# Patient Record
Sex: Male | Born: 1965 | Race: Black or African American | Hispanic: No | Marital: Married | Smoking: Never smoker
Health system: Southern US, Community
[De-identification: ages and names within clinical notes are randomized; demographics above are authoritative.]

## PROBLEM LIST (undated history)

## (undated) DIAGNOSIS — K219 Gastro-esophageal reflux disease without esophagitis: Secondary | ICD-10-CM

## (undated) DIAGNOSIS — M1990 Unspecified osteoarthritis, unspecified site: Secondary | ICD-10-CM

## (undated) DIAGNOSIS — E785 Hyperlipidemia, unspecified: Secondary | ICD-10-CM

## (undated) DIAGNOSIS — E559 Vitamin D deficiency, unspecified: Secondary | ICD-10-CM

## (undated) DIAGNOSIS — D4989 Neoplasm of unspecified behavior of other specified sites: Secondary | ICD-10-CM

## (undated) DIAGNOSIS — I10 Essential (primary) hypertension: Secondary | ICD-10-CM

## (undated) DIAGNOSIS — R0789 Other chest pain: Secondary | ICD-10-CM

## (undated) HISTORY — DX: Neoplasm of unspecified behavior of other specified sites: D49.89

## (undated) HISTORY — DX: Essential (primary) hypertension: I10

## (undated) HISTORY — DX: Hyperlipidemia, unspecified: E78.5

## (undated) HISTORY — DX: Other chest pain: R07.89

## (undated) HISTORY — PX: NO PAST SURGERIES: SHX2092

## (undated) HISTORY — DX: Vitamin D deficiency, unspecified: E55.9

## (undated) HISTORY — DX: Gastro-esophageal reflux disease without esophagitis: K21.9

---

## 2000-12-06 ENCOUNTER — Encounter: Payer: Self-pay | Admitting: *Deleted

## 2000-12-06 ENCOUNTER — Emergency Department (HOSPITAL_COMMUNITY): Admission: EM | Admit: 2000-12-06 | Discharge: 2000-12-06 | Payer: Self-pay | Admitting: *Deleted

## 2001-02-12 ENCOUNTER — Emergency Department (HOSPITAL_COMMUNITY): Admission: EM | Admit: 2001-02-12 | Discharge: 2001-02-12 | Payer: Self-pay | Admitting: Emergency Medicine

## 2003-09-30 ENCOUNTER — Emergency Department (HOSPITAL_COMMUNITY): Admission: EM | Admit: 2003-09-30 | Discharge: 2003-09-30 | Payer: Self-pay | Admitting: Emergency Medicine

## 2004-04-27 ENCOUNTER — Emergency Department (HOSPITAL_COMMUNITY): Admission: EM | Admit: 2004-04-27 | Discharge: 2004-04-28 | Payer: Self-pay | Admitting: Emergency Medicine

## 2005-12-25 ENCOUNTER — Emergency Department (HOSPITAL_COMMUNITY): Admission: EM | Admit: 2005-12-25 | Discharge: 2005-12-26 | Payer: Self-pay | Admitting: Emergency Medicine

## 2006-10-11 ENCOUNTER — Emergency Department (HOSPITAL_COMMUNITY): Admission: EM | Admit: 2006-10-11 | Discharge: 2006-10-12 | Payer: Self-pay | Admitting: Emergency Medicine

## 2007-03-22 ENCOUNTER — Emergency Department (HOSPITAL_COMMUNITY): Admission: EM | Admit: 2007-03-22 | Discharge: 2007-03-22 | Payer: Self-pay | Admitting: Emergency Medicine

## 2009-06-08 ENCOUNTER — Emergency Department (HOSPITAL_COMMUNITY): Admission: EM | Admit: 2009-06-08 | Discharge: 2009-06-09 | Payer: Self-pay | Admitting: Emergency Medicine

## 2010-02-04 ENCOUNTER — Emergency Department (HOSPITAL_COMMUNITY): Admission: EM | Admit: 2010-02-04 | Discharge: 2010-02-04 | Payer: Self-pay | Admitting: Emergency Medicine

## 2010-09-28 ENCOUNTER — Emergency Department (HOSPITAL_COMMUNITY)
Admission: EM | Admit: 2010-09-28 | Discharge: 2010-09-28 | Disposition: A | Discharge status: Home / Self Care | Payer: Self-pay | Attending: Emergency Medicine | Admitting: Emergency Medicine

## 2010-09-28 ENCOUNTER — Encounter: Payer: Self-pay | Admitting: *Deleted

## 2010-09-28 DIAGNOSIS — R51 Headache: Secondary | ICD-10-CM | POA: Insufficient documentation

## 2010-09-28 DIAGNOSIS — Z87891 Personal history of nicotine dependence: Secondary | ICD-10-CM | POA: Insufficient documentation

## 2010-09-28 MED ORDER — HYDROMORPHONE HCL 1 MG/ML IJ SOLN
1.0000 mg | Freq: Once | INTRAMUSCULAR | Status: DC
  Filled 2010-09-28: qty 1

## 2010-09-28 MED ORDER — IBUPROFEN 800 MG PO TABS: 800.0000 mg | ORAL_TABLET | Freq: Three times a day (TID) | ORAL | Status: AC

## 2010-09-28 MED ORDER — HYDROMORPHONE HCL 1 MG/ML IJ SOLN
1.0000 mg | Freq: Once | INTRAMUSCULAR | Status: AC
  Administered 2010-09-28: 1 mg via INTRAMUSCULAR

## 2010-09-28 MED ORDER — DIPHENHYDRAMINE HCL 25 MG PO CAPS
25.0000 mg | ORAL_CAPSULE | Freq: Once | ORAL | Status: AC
  Administered 2010-09-28: 25 mg via ORAL
  Filled 2010-09-28: qty 1

## 2010-09-28 MED ORDER — KETOROLAC TROMETHAMINE 30 MG/ML IJ SOLN
60.0000 mg | Freq: Once | INTRAMUSCULAR | Status: AC
  Administered 2010-09-28: 60 mg via INTRAMUSCULAR
  Filled 2010-09-28: qty 2

## 2010-09-28 MED ORDER — ONDANSETRON HCL 4 MG PO TABS
4.0000 mg | ORAL_TABLET | Freq: Once | ORAL | Status: AC
  Administered 2010-09-28: 4 mg via ORAL
  Filled 2010-09-28: qty 1

## 2010-09-28 NOTE — ED Notes (Signed)
Pt reports left side head pain x 1 week and states he took ibuprofen at home without relief.

## 2010-09-28 NOTE — ED Provider Notes (Signed)
History     Chief Complaint  Patient presents with  . Headache   Patient is a 45 y.o. male presenting with headaches. The history is provided by the patient.  Headache  This is a new problem. The current episode started more than 1 week ago. The problem occurs constantly. The problem has not changed since onset.The headache is associated with nothing. The pain is located in the left unilateral and temporal region. The quality of the pain is described as sharp. The pain is moderate. Pertinent negatives include no anorexia, no fever, no near-syncope, no nausea and no vomiting. He has tried NSAIDs for the symptoms. The treatment provided no relief.    History reviewed. No pertinent past medical history.  History reviewed. No pertinent past surgical history.  History reviewed. No pertinent family history.  History  Substance Use Topics  . Smoking status: Never Smoker   . Smokeless tobacco: Former Neurosurgeon    Quit date: 09/26/2010  . Alcohol Use: 1.8 oz/week    3 Cans of beer per week      Review of Systems  Constitutional: Negative for fever.  Cardiovascular: Negative for near-syncope.  Gastrointestinal: Negative for nausea, vomiting and anorexia.  Neurological: Positive for headaches.  All other systems reviewed and are negative.    Physical Exam  BP 154/103  Pulse 76  Temp(Src) 98.7 F (37.1 C) (Oral)  Resp 16  Ht 6' (1.829 m)  Wt 238 lb (107.956 kg)  BMI 32.28 kg/m2  SpO2 99%  Physical Exam  Constitutional: He is oriented to person, place, and time. He appears well-developed and well-nourished.  HENT:  Head: Normocephalic and atraumatic.  Right Ear: External ear normal.  Left Ear: External ear normal.  Nose: Nose normal.  Mouth/Throat: Oropharynx is clear and moist.  Eyes: Conjunctivae and EOM are normal.  Neck: Normal range of motion. Neck supple.  Cardiovascular: Regular rhythm.   Pulmonary/Chest: Effort normal and breath sounds normal.  Abdominal: Soft.  Bowel sounds are normal.  Musculoskeletal: Normal range of motion.  Neurological: He is alert and oriented to person, place, and time. He has normal reflexes. He displays normal reflexes. No cranial nerve deficit. Coordination normal.       No vision changes, no hearing changes, no difficulty with speaking or swallowing, no nausea, no vomiting  Skin: Skin is warm and dry.    ED Course  Procedures  MDM       Nicoletta Dress. Colon Branch, MD 09/28/10 7829

## 2010-09-28 NOTE — ED Notes (Signed)
Pt discirbes a sharp pain to the upper left side of head.

## 2010-09-28 NOTE — Progress Notes (Signed)
0700 Headache has resolved.

## 2011-01-03 LAB — COMPREHENSIVE METABOLIC PANEL
ALT: 42
Alkaline Phosphatase: 68
CO2: 28
Chloride: 104
Glucose, Bld: 106 — ABNORMAL HIGH
Potassium: 3.7
Sodium: 137
Total Protein: 7.3

## 2011-01-03 LAB — CBC
HCT: 43.2
Hemoglobin: 15
MCHC: 34.7
MCV: 90.7
Platelets: 200
RBC: 4.76
RDW: 13.6
WBC: 5.3

## 2011-01-03 LAB — DIFFERENTIAL
Basophils Relative: 1
Eosinophils Absolute: 0.3
Monocytes Relative: 8
Neutrophils Relative %: 46

## 2011-05-28 ENCOUNTER — Emergency Department (HOSPITAL_COMMUNITY)
Admission: EM | Admit: 2011-05-28 | Discharge: 2011-05-28 | Disposition: A | Discharge status: Home / Self Care | Payer: Self-pay | Attending: Emergency Medicine | Admitting: Emergency Medicine

## 2011-05-28 ENCOUNTER — Emergency Department (HOSPITAL_COMMUNITY): Payer: Self-pay

## 2011-05-28 ENCOUNTER — Encounter (HOSPITAL_COMMUNITY): Payer: Self-pay

## 2011-05-28 DIAGNOSIS — X500XXA Overexertion from strenuous movement or load, initial encounter: Secondary | ICD-10-CM | POA: Insufficient documentation

## 2011-05-28 DIAGNOSIS — M25473 Effusion, unspecified ankle: Secondary | ICD-10-CM | POA: Insufficient documentation

## 2011-05-28 DIAGNOSIS — S93409A Sprain of unspecified ligament of unspecified ankle, initial encounter: Secondary | ICD-10-CM | POA: Insufficient documentation

## 2011-05-28 DIAGNOSIS — M7989 Other specified soft tissue disorders: Secondary | ICD-10-CM | POA: Insufficient documentation

## 2011-05-28 DIAGNOSIS — Y9364 Activity, baseball: Secondary | ICD-10-CM | POA: Insufficient documentation

## 2011-05-28 DIAGNOSIS — M25579 Pain in unspecified ankle and joints of unspecified foot: Secondary | ICD-10-CM | POA: Insufficient documentation

## 2011-05-28 DIAGNOSIS — M79609 Pain in unspecified limb: Secondary | ICD-10-CM | POA: Insufficient documentation

## 2011-05-28 DIAGNOSIS — M25476 Effusion, unspecified foot: Secondary | ICD-10-CM | POA: Insufficient documentation

## 2011-05-28 MED ORDER — OXYCODONE-ACETAMINOPHEN 5-325 MG PO TABS: 1.0000 | ORAL_TABLET | ORAL | Status: DC | PRN

## 2011-05-28 MED ORDER — OXYCODONE-ACETAMINOPHEN 5-325 MG PO TABS
2.0000 | ORAL_TABLET | Freq: Once | ORAL | Status: AC
  Administered 2011-05-28: 2 via ORAL
  Filled 2011-05-28: qty 2

## 2011-05-28 MED ORDER — IBUPROFEN 800 MG PO TABS
800.0000 mg | ORAL_TABLET | Freq: Once | ORAL | Status: AC
  Administered 2011-05-28: 800 mg via ORAL
  Filled 2011-05-28: qty 1

## 2011-05-28 MED ORDER — IBUPROFEN 800 MG PO TABS: 800.0000 mg | ORAL_TABLET | Freq: Three times a day (TID) | ORAL | Status: AC

## 2011-05-28 NOTE — ED Provider Notes (Signed)
History     CSN: [id number]  Arrival date & time 05/28/11  1636   First MD Initiated Contact with Patient 05/28/11 1655      Chief Complaint  Patient presents with  . Ankle Pain    (Consider location/radiation/quality/duration/timing/severity/associated sxs/prior treatment) HPI Corey Thomas is a 46 y.o. male who presents to the Emergency Department complaining of  Right ankle pain that began while playing softball. He slid into base and twisted his foot, everting it. Now with swelling and pain. Unable to bear weight without pain. Has taken no medicines. Denies numbness, tingling.  History reviewed. No pertinent past medical history.  History reviewed. No pertinent past surgical history.  No family history on file.  History  Substance Use Topics  . Smoking status: Never Smoker   . Smokeless tobacco: Former Neurosurgeon    Quit date: 09/26/2010  . Alcohol Use: 1.8 oz/week    3 Cans of beer per week      Review of Systems  Constitutional: Negative for fever.       10 Systems reviewed and are negative for acute change except as noted in the HPI.  HENT: Negative for congestion.   Eyes: Negative for discharge and redness.  Respiratory: Negative for cough and shortness of breath.   Cardiovascular: Negative for chest pain.  Gastrointestinal: Negative for vomiting and abdominal pain.  Musculoskeletal: Negative for back pain.  Skin: Negative for rash.  Neurological: Negative for syncope, numbness and headaches.  Psychiatric/Behavioral:       No behavior change.    Allergies  Review of patient's allergies indicates no known allergies.  Home Medications   Current Outpatient Rx  Name Route Sig Dispense Refill  . IBUPROFEN 800 MG PO TABS Oral Take 1 tablet (800 mg total) by mouth 3 (three) times daily. 21 tablet 0  . OXYCODONE-ACETAMINOPHEN 5-325 MG PO TABS Oral Take 1 tablet by mouth every 4 (four) hours as needed for pain. 30 tablet 0    BP 167/97  Pulse 90  Temp(Src)  98.4 F (36.9 C) (Oral)  Resp 20  Ht 6' (1.829 m)  Wt 205 lb (92.987 kg)  BMI 27.80 kg/m2  SpO2 100%  Physical Exam  Nursing note and vitals reviewed. Constitutional:       Awake, alert, nontoxic appearance.  HENT:  Head: Atraumatic.  Eyes: Right eye exhibits no discharge. Left eye exhibits no discharge.  Neck: Neck supple.  Pulmonary/Chest: Effort normal. He exhibits no tenderness.  Abdominal: Soft. There is no tenderness. There is no rebound.  Musculoskeletal: He exhibits tenderness.       Right lateral ankle with swelling. Limited dorsiflexion without pain. Unable to evert without pain. Pulses 2+  Neurological:       Mental status and motor strength appears baseline for patient and situation.  Skin: No rash noted.  Psychiatric: He has a normal mood and affect.    ED Course  Procedures (including critical care time)  Labs Reviewed - No data to display Dg Ankle Complete Right  05/28/2011  *RADIOLOGY REPORT*  Clinical Data: , complaining of right-sided ankle pain.  RIGHT ANKLE - COMPLETE 3+ VIEW  Comparison: No priors.  Findings: AP, lateral and oblique views of the right ankle demonstrate extensive soft tissue swelling overlying the lateral malleolus.  No underlying displaced fracture of the distal fibula is appreciated.  On the lateral projection, there is a small bony fragment superior to the talonavicular joint, likely to represent a capsular avulsion fracture.  No other  displaced fractures are identified.  IMPRESSION: 1.  Probable capsular avulsion fracture at the talonavicular joint, as above. 2.  Extensive soft tissue swelling overlying the lateral malleolus, without evidence of displaced distal fibular fracture.  Original Report Authenticated By: Florencia Reasons, M.D.     1. Ankle sprain       MDM  Patient with eversion injury to his right ankle playing softball. Given analgesics, placed in ASO and on crutches. Patient to follow up with Dr. Hilda Lias, ortho on  Monday.Pt stable in ED with no significant deterioration in condition.The patient appears reasonably screened and/or stabilized for discharge and I doubt any other medical condition or other [facility] requiring further screening, evaluation, or treatment in the ED at this time prior to discharge.  MDM Reviewed: nursing note and vitals Interpretation: x-ray           Nicoletta Dress. Colon Branch, MD 05/28/11 2229

## 2011-05-28 NOTE — Discharge Instructions (Signed)
Elevate the foot as much as possible. Apply ice for the swelling. Do not do weight bearing for the next 24 hours then begin by toe tapping and gradually apply weight. Use the  Pain medicines as directed. Call Dr. Sanjuan Dame office on Monday for an appointment.    Ankle Sprain An ankle sprain happens when the bands of tissue that hold the ankle bones together (ligaments) stretch too much and tear.  HOME CARE   Put ice on the ankle for 15 to 20 minutes, 3 to 4 times a day.   Put ice in a plastic bag.   Place a towel between your skin and the bag.   You may stop icing when the puffiness (swelling) goes down.   Raise (elevate) the injured ankle to lessen puffiness.   Use crutches if your doctor tells you to. Use them until you can walk without pain.   If a plaster splint was applied:   Rest the plaster splint on nothing harder than a pillow for 24 hours.   Do not put weight on it.   Do not get it wet.   Take it off to shower or bathe.   Follow up with your doctor.   Use an elastic wrap for support. Take the wrap off if the toes lose feeling (numb), tingle, or turn cold or blue.   If an air splint was applied:   Add or release air to make it comfortable.   Take it off at night and to shower and bathe.   Wiggle your toes while wearing the air splint.   Only take medicine as told by your doctor.   Do not drive until your doctor says it is okay.  GET HELP RIGHT AWAY IF:   You have more bruising, puffiness, or pain.   Your toes feel cold.   Your medicine does not help lessen your pain.   You are losing feeling in your toes or they turn blue.   You have severe pain.  MAKE SURE YOU:   Understand these instructions.   Will watch your condition.   Will get help right away if you are not doing well or get worse.  Document Released: 08/24/2007 Document Revised: 02/24/2011 Document Reviewed: 08/24/2007 [facility] Patient Information 2012 [place], [place].

## 2011-05-28 NOTE — ED Notes (Signed)
Playing softball today, slid into base and injured right ankle

## 2011-05-31 ENCOUNTER — Encounter (HOSPITAL_COMMUNITY): Payer: Self-pay

## 2011-05-31 ENCOUNTER — Emergency Department (HOSPITAL_COMMUNITY)
Admission: EM | Admit: 2011-05-31 | Discharge: 2011-05-31 | Disposition: A | Discharge status: Home / Self Care | Payer: Self-pay | Attending: Orthopaedic Surgery | Admitting: Orthopaedic Surgery

## 2011-05-31 DIAGNOSIS — S93409A Sprain of unspecified ligament of unspecified ankle, initial encounter: Secondary | ICD-10-CM | POA: Insufficient documentation

## 2011-05-31 DIAGNOSIS — Y9239 Other specified sports and athletic area as the place of occurrence of the external cause: Secondary | ICD-10-CM | POA: Insufficient documentation

## 2011-05-31 DIAGNOSIS — X58XXXA Exposure to other specified factors, initial encounter: Secondary | ICD-10-CM | POA: Insufficient documentation

## 2011-05-31 DIAGNOSIS — Y9364 Activity, baseball: Secondary | ICD-10-CM | POA: Insufficient documentation

## 2011-05-31 DIAGNOSIS — S92253A Displaced fracture of navicular [scaphoid] of unspecified foot, initial encounter for closed fracture: Secondary | ICD-10-CM | POA: Insufficient documentation

## 2011-05-31 MED ORDER — HYDROCODONE-ACETAMINOPHEN 7.5-325 MG PO TABS: 1.0000 | ORAL_TABLET | Freq: Four times a day (QID) | ORAL | Status: AC | PRN

## 2011-05-31 MED ORDER — HYDROCODONE-ACETAMINOPHEN 5-325 MG PO TABS: 1.5000 | ORAL_TABLET | Freq: Four times a day (QID) | ORAL | Status: DC | PRN

## 2011-05-31 NOTE — Consult Note (Signed)
Reason for Consult:right foot and ankle pain Referring Physician: ER  Corey Thomas is an 46 y.o. male.  HPI: He hurt his right foot and ankle after sliding into second base during a game Saturday in [place].  He had swelling and pain.  He came to the ER then.  X-rays showed avulsion fracture off anterior navicular of the foot plus swelling of soft tissue. He was given splint and crutches.  He has had continued pain, swelling.  He has no other injury.  History reviewed. No pertinent past medical history.  History reviewed. No pertinent past surgical history.  No family history on file.  Social History:  reports that he has never smoked. He quit smokeless tobacco use about 8 months ago. He reports that he drinks about 1.8 ounces of alcohol per week. He reports that he does not use illicit drugs.  Allergies: No Known Allergies  Medications: I have reviewed the patient's current medications.  No results found for this or any previous visit (from the past 48 hour(s)).  No results found.  Review of Systems  Constitutional: Negative.   HENT: Negative.   Eyes: Negative.   Respiratory: Negative.   Cardiovascular: Negative.   Gastrointestinal: Negative.   Genitourinary: Negative.   Musculoskeletal: Positive for joint pain (right foot swelling and ankle swelling since injury over the weekend.).  Skin: Negative.   Neurological: Negative.   Endo/Heme/Allergies: Negative.   Psychiatric/Behavioral: Negative.    Blood pressure 134/87, pulse 68, temperature 98.2 F (36.8 C), temperature source Oral, height 6' (1.829 m), weight 92.987 kg (205 lb), SpO2 96.00%. Physical Exam  Constitutional: He is oriented to person, place, and time. He appears well-developed and well-nourished.  HENT:  Head: Normocephalic and atraumatic.  Eyes: Conjunctivae and EOM are normal. Pupils are equal, round, and reactive to light.  Neck: Neck supple.  Cardiovascular: Normal rate, regular rhythm and intact  distal pulses.   Respiratory: Effort normal and breath sounds normal.  GI: Soft. Bowel sounds are normal.  Musculoskeletal: He exhibits edema (right ankle, more laterally) and tenderness (right ankle and foot dorsally).       Right ankle: He exhibits decreased range of motion and swelling. tenderness.       Feet:  Neurological: He is alert and oriented to person, place, and time. He has normal reflexes.  Skin: Skin is warm and dry.  Psychiatric: He has a normal mood and affect. His behavior is normal. Judgment and thought content normal.    Assessment/Plan: Avulsion fracture of the right ankle navicular and strain of the right ankle.  Plan: Norco 7.5 Continue crutches CAM walker Contrast Baths To be seen in office next week.   Vauda Salvucci 05/31/2011, 9:20 AM

## 2011-05-31 NOTE — Discharge Instructions (Signed)
Foot Fracture Your caregiver has diagnosed you as having a foot fracture (broken bone). Your foot has many bones. You have a fracture, or break, in one of these bones. In some cases, your doctor may put on a splint or removable fracture boot until the swelling in your foot has lessened. A cast may or may not be required. HOME CARE INSTRUCTIONS  If you do not have a cast or splint:  You may bear weight on your injured foot as tolerated or advised.   Do not put any weight on your injured foot for as long as directed by your caregiver. Slowly increase the amount of time you walk on the foot as the pain and swelling allows or as advised.   Use crutches until you can bear weight without pain. A gradual increase in weight bearing may help.   Apply ice to the injury for 15 to 20 minutes each hour while awake for the first 2 days. Put the ice in a plastic bag and place a towel between the bag of ice and your skin.   If an ace bandage (stretchy, elastic wrapping bandage) was applied, you may re-wrap it if ankle is more painful or your toes become cold and swollen.  If you have a cast or splint:  Use your crutches for as long as directed by your caregiver.   To lessen the swelling, keep the injured foot elevated on pillows while lying down or sitting. Elevate your foot above your heart.   Apply ice to the injury for 15 to 20 minutes each hour while awake for the first 2 days. Put the ice in a plastic bag and place a thin towel between the bag of ice and your cast.   Plaster or fiberglass cast:   Do not try to scratch the skin under the cast using a sharp or pointed object down the cast.   Check the skin around the cast every day. You may put lotion on any red or sore areas.   Keep your cast clean and dry.   Plaster splint:   Wear the splint until you are seen for a follow-up examination.   You may loosen the elastic around the splint if your toes become numb, tingle, or turn blue or cold. Do  not rest it on anything harder than a pillow in the first 24 hours.   Do not put pressure on any part of your splint. Use your crutches as directed.   Keep your splint dry. It can be protected during bathing with a plastic bag. Do not lower the splint into water.   If you have a fracture boot you may remove it to shower. Bear weight only as instructed by your caregiver.   Only take over-the-counter or prescription medicines for pain, discomfort, or fever as directed by your caregiver.  SEEK IMMEDIATE MEDICAL CARE IF:   Your cast gets damaged or breaks.   You have continued severe pain or more swelling than you did before the cast was put on.   Your skin or nails of your casted foot turn blue, gray, feel cold or numb.   There is a bad smell from your cast.   There is severe pain with movement of your toes.   There are new stains and/or drainage coming from under the cast.  MAKE SURE YOU:   Understand these instructions.   Will watch your condition.   Will get help right away if you are not doing well or get   worse.  Document Released: 03/04/2000 Document Revised: 02/24/2011 Document Reviewed: 04/10/2008 [employer] Patient Information 2012 [employer].Cast or Splint Care Casts and splints support injured limbs and keep bones from moving while they heal.  HOME CARE  Keep the cast or splint uncovered during the drying period.   A plaster cast can take 24 to 48 hours to dry.   A fiberglass cast will dry in less than 1 hour.   Do not rest the cast on anything harder than a pillow for 24 hours.   Do not put weight on your injured limb. Do not put pressure on the cast. Wait for your doctor's approval.   Keep the cast or splint dry.   Cover the cast or splint with a plastic bag during baths or wet weather.   If you have a cast over your chest and belly (trunk), take sponge baths until the cast is taken off.   Keep your cast or splint clean. Wash a dirty cast with a damp  cloth.   Do not put any objects under your cast or splint. Do not scratch the skin under the cast with an object.   Do not take out the padding from inside your cast.   Exercise your joints near the cast as told by your doctor.   Raise (elevate) your injured limb on 1 or 2 pillows for the first 1 to 3 days.  GET HELP RIGHT AWAY IF:  Your cast or splint cracks.   Your cast or splint is too tight or too loose.   You itch badly under the cast.   Your cast gets wet or has a soft spot.   You have a bad smell coming from the cast.   You get an object stuck under the cast.   Your skin around the cast becomes red or raw.   You have new or more pain after the cast is put on.   You have fluid leaking through the cast.   You cannot move your fingers or toes.   Your fingers or toes turn colors or are cool, painful, or puffy (swollen).   You have tingling or lose feeling (numbness) around the injured area.   You have pain or pressure under the cast.   You have trouble breathing or have shortness of breath.   You have chest pain.  MAKE SURE YOU:  Understand these instructions.   Will watch your condition.   Will get help right away if you are not doing well or get worse.  Document Released: 07/07/2010 Document Revised: 02/24/2011 Document Reviewed: 07/07/2010 [employer] Patient Information 2012 [employer]. 

## 2011-05-31 NOTE — ED Notes (Signed)
Pt reports was seen here Saturday for r ankle pain.  Reports was diagnosed with fracture and was instructed to call Dr. Hilda Lias on Monday.  Says called the office yesterday but did not here from them.  Wife called again this am and was instructed to come to ed and Dr. Hilda Lias would see him here.

## 2011-09-29 ENCOUNTER — Other Ambulatory Visit (HOSPITAL_COMMUNITY): Payer: Self-pay | Admitting: Orthopaedic Surgery

## 2011-09-29 ENCOUNTER — Ambulatory Visit (HOSPITAL_COMMUNITY)
Admission: RE | Admit: 2011-09-29 | Discharge: 2011-09-29 | Disposition: A | Discharge status: Home / Self Care | Payer: Medicaid Other | Source: Ambulatory Visit | Attending: Orthopaedic Surgery | Admitting: Orthopaedic Surgery

## 2011-09-29 DIAGNOSIS — M25571 Pain in right ankle and joints of right foot: Secondary | ICD-10-CM

## 2011-09-29 DIAGNOSIS — M79671 Pain in right foot: Secondary | ICD-10-CM

## 2011-09-29 DIAGNOSIS — M25579 Pain in unspecified ankle and joints of unspecified foot: Secondary | ICD-10-CM | POA: Insufficient documentation

## 2012-01-11 ENCOUNTER — Emergency Department (HOSPITAL_COMMUNITY)
Admission: EM | Admit: 2012-01-11 | Discharge: 2012-01-11 | Disposition: A | Discharge status: Home / Self Care | Payer: Worker's Compensation | Attending: Emergency Medicine | Admitting: Emergency Medicine

## 2012-01-11 ENCOUNTER — Encounter (HOSPITAL_COMMUNITY): Payer: Self-pay | Admitting: Emergency Medicine

## 2012-01-11 DIAGNOSIS — R0789 Other chest pain: Secondary | ICD-10-CM | POA: Insufficient documentation

## 2012-01-11 DIAGNOSIS — T7840XA Allergy, unspecified, initial encounter: Secondary | ICD-10-CM

## 2012-01-11 DIAGNOSIS — Y929 Unspecified place or not applicable: Secondary | ICD-10-CM | POA: Insufficient documentation

## 2012-01-11 DIAGNOSIS — T6391XA Toxic effect of contact with unspecified venomous animal, accidental (unintentional), initial encounter: Secondary | ICD-10-CM | POA: Insufficient documentation

## 2012-01-11 DIAGNOSIS — Z87891 Personal history of nicotine dependence: Secondary | ICD-10-CM | POA: Insufficient documentation

## 2012-01-11 DIAGNOSIS — T63461A Toxic effect of venom of wasps, accidental (unintentional), initial encounter: Secondary | ICD-10-CM | POA: Insufficient documentation

## 2012-01-11 DIAGNOSIS — Y939 Activity, unspecified: Secondary | ICD-10-CM | POA: Insufficient documentation

## 2012-01-11 MED ORDER — EPINEPHRINE 0.3 MG/0.3ML IJ DEVI: 0.3000 mg | Freq: Once | INTRAMUSCULAR | Status: DC

## 2012-01-11 MED ORDER — OXYCODONE-ACETAMINOPHEN 5-325 MG PO TABS: 2.0000 | ORAL_TABLET | ORAL | Status: DC | PRN

## 2012-01-11 NOTE — ED Notes (Signed)
Pt sts sent from [facility] with multiple hornet sting to head; pt sts trouble getting deep breath; pt sts pain in head from stings; swelling noted; pt given steriod, epi and benadryl; no resp distress noted

## 2012-01-11 NOTE — ED Provider Notes (Signed)
History   This chart was scribed for Corey Baker, MD by Charolett Bumpers . The patient was seen in room TR05C/TR05C. Patient's care was started at 1109.   CSN: [id number] Arrival date & time 01/11/12  1014  First MD Initiated Contact with Patient 01/11/12 1109      Chief Complaint  Patient presents with  . Allergic Reaction  . Insect Bite    The history is provided by the patient. No language interpreter was used.   DOMINICO CAMPAGNA is a 46 y.o. male who presents to the Emergency Department complaining of an allergic reaction after being stung be a hornets PTA. He reports associated local swelling at insect stings, difficulty breathing, mild difficulty swallowing, chest tightness that started this morning. He was seen at [facility] where he received benadryl, steroid injection and epinephrine. He reports improvement in his symptoms after being seen at [facility]. He denies any h/o similar reactions.   History reviewed. No pertinent past medical history.  History reviewed. No pertinent past surgical history.  History reviewed. No pertinent family history.  History  Substance Use Topics  . Smoking status: Never Smoker   . Smokeless tobacco: Former Neurosurgeon    Quit date: 09/26/2010  . Alcohol Use: 1.8 oz/week    3 Cans of beer per week      Review of Systems  Constitutional: Negative for fever and chills.  HENT: Positive for trouble swallowing.   Respiratory: Positive for chest tightness and shortness of breath.   Gastrointestinal: Negative for nausea and vomiting.  Skin:       Insect stings.   Neurological: Negative for weakness.  All other systems reviewed and are negative.    Allergies  Review of patient's allergies indicates no known allergies.  Home Medications  No current outpatient prescriptions on file.  BP 169/96  Pulse 73  Temp 98.3 F (36.8 C) (Oral)  Resp 20  SpO2 100%  Physical Exam  Nursing note and vitals reviewed. Constitutional: He is oriented to  person, place, and time. He appears well-developed and well-nourished.  Non-toxic appearance. No distress.  HENT:  Head: Normocephalic and atraumatic.       Swelling at right posterior parietal area. No skin erythema.   Eyes: Conjunctivae normal, EOM and lids are normal. Pupils are equal, round, and reactive to light.  Neck: Normal range of motion. Neck supple. No tracheal deviation present. No mass present.  Cardiovascular: Normal rate, regular rhythm and normal heart sounds.  Exam reveals no gallop.   No murmur heard. Pulmonary/Chest: Effort normal and breath sounds normal. No stridor. No respiratory distress. He has no decreased breath sounds. He has no wheezes. He has no rhonchi. He has no rales.  Abdominal: Soft. Normal appearance and bowel sounds are normal. He exhibits no distension. There is no tenderness. There is no rebound and no CVA tenderness.  Musculoskeletal: Normal range of motion. He exhibits no edema and no tenderness.  Neurological: He is alert and oriented to person, place, and time. He has normal strength. No cranial nerve deficit or sensory deficit. GCS eye subscore is 4. GCS verbal subscore is 5. GCS motor subscore is 6.  Skin: Skin is warm and dry. No abrasion and no rash noted.  Psychiatric: He has a normal mood and affect. His speech is normal and behavior is normal.    ED Course  Procedures (including critical care time)  DIAGNOSTIC STUDIES: Oxygen Saturation is 100% on room air, normal by my interpretation.  COORDINATION OF CARE:  11:20-Discussed planned course of treatment with the patient, who is agreeable at this time.    Labs Reviewed - No data to display No results found.   No diagnosis found.    MDM  I personally performed the services described in this documentation, which was scribed in my presence. The recorded information has been reviewed and considered.  Pt given epi pen --no signs of systemic allergic reaction          Corey Baker, MD 01/11/12 1131

## 2012-10-24 ENCOUNTER — Other Ambulatory Visit: Payer: Self-pay | Admitting: Family Medicine

## 2012-12-26 ENCOUNTER — Ambulatory Visit (INDEPENDENT_AMBULATORY_CARE_PROVIDER_SITE_OTHER): Payer: Medicaid Other | Admitting: Family Medicine

## 2012-12-26 ENCOUNTER — Ambulatory Visit: Payer: Self-pay | Admitting: Family Medicine

## 2012-12-26 ENCOUNTER — Encounter: Payer: Self-pay | Admitting: Family Medicine

## 2012-12-26 ENCOUNTER — Ambulatory Visit (HOSPITAL_COMMUNITY)
Admission: RE | Admit: 2012-12-26 | Discharge: 2012-12-26 | Disposition: A | Discharge status: Home / Self Care | Payer: Medicaid Other | Source: Ambulatory Visit | Attending: Family Medicine | Admitting: Family Medicine

## 2012-12-26 VITALS — BP 150/110 | HR 78 | Temp 97.0°F | Resp 18

## 2012-12-26 DIAGNOSIS — M25471 Effusion, right ankle: Secondary | ICD-10-CM

## 2012-12-26 DIAGNOSIS — M25579 Pain in unspecified ankle and joints of unspecified foot: Secondary | ICD-10-CM | POA: Insufficient documentation

## 2012-12-26 DIAGNOSIS — M25476 Effusion, unspecified foot: Secondary | ICD-10-CM | POA: Insufficient documentation

## 2012-12-26 DIAGNOSIS — M25473 Effusion, unspecified ankle: Secondary | ICD-10-CM

## 2012-12-26 DIAGNOSIS — R030 Elevated blood-pressure reading, without diagnosis of hypertension: Secondary | ICD-10-CM

## 2012-12-26 MED ORDER — NAPROXEN 500 MG PO TABS: 500.0000 mg | ORAL_TABLET | Freq: Two times a day (BID) | ORAL | Status: DC

## 2012-12-26 MED ORDER — HYDROCODONE-ACETAMINOPHEN 5-325 MG PO TABS: 1.0000 | ORAL_TABLET | Freq: Four times a day (QID) | ORAL | Status: DC | PRN

## 2012-12-26 NOTE — Assessment & Plan Note (Signed)
  No h/o HTN, in considerable pain today Will have him return for repeat BP

## 2012-12-26 NOTE — Progress Notes (Signed)
  Subjective:    Patient ID: Corey Thomas, male    DOB: [birth year], 47 y.o.   MRN: [id number]  HPI  Pt here with right foot/ankle pain. Woke up this morning with swelling in foot, no injury at all. Had injury last year with softball resulting in fracture of foot and sprain since then no problems. No history of gout, no recent illness, no insect bites. Has not taken any meds. 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   Review of Systems  GEN- denies fatigue, fever, weight loss,weakness, recent illness HEENT- denies eye drainage, change in vision, nasal discharge, CVS- denies chest pain, palpitations RESP- denies SOB, cough, wheeze MSK- + joint pain, muscle aches, injury Neuro- denies headache, dizziness, syncope, seizure activity      Objective:   Physical Exam GEN- sitting in pain, no cardiopulmonary distress MSK- Right ankle+swelling, TTP, mild warmth, decreased ROM, pain with weight bearing, achilles in tact, no pain with ROM metarsals, Left Foot- normal inspection FROM        Assessment & Plan:

## 2012-12-26 NOTE — Assessment & Plan Note (Addendum)
   Acute onset with effusion in joint. No signs of infection I am worried about acute gout- RICE therapy Xray showed no injury Given naprosyn and norco He declined getting the labs drawn before leaving the office after I finished my direct care with him, he was called back by nurse to remind him to get labs done in [place] after xray  He does admit to some heavy ETOH but would not give specifics Very interesting vibe between him and wife in office

## 2012-12-26 NOTE — Patient Instructions (Signed)
We will call with lab and xray results Take anti-inflammatory twice a day Pain medication as needed No alcohol Get xray done F/U 4 weeks for blood pressure

## 2012-12-27 ENCOUNTER — Ambulatory Visit: Payer: Self-pay | Admitting: Family Medicine

## 2013-02-05 ENCOUNTER — Ambulatory Visit: Payer: Medicaid Other | Admitting: Family Medicine

## 2013-05-23 ENCOUNTER — Encounter: Payer: Self-pay | Admitting: Family Medicine

## 2013-05-23 ENCOUNTER — Ambulatory Visit (INDEPENDENT_AMBULATORY_CARE_PROVIDER_SITE_OTHER): Payer: Medicaid Other | Admitting: Family Medicine

## 2013-05-23 VITALS — BP 160/98 | HR 84 | Temp 98.3°F | Resp 16 | Ht 72.0 in | Wt 205.0 lb

## 2013-05-23 DIAGNOSIS — H538 Other visual disturbances: Secondary | ICD-10-CM

## 2013-05-23 DIAGNOSIS — I10 Essential (primary) hypertension: Secondary | ICD-10-CM

## 2013-05-23 NOTE — Progress Notes (Signed)
   Subjective:    Patient ID: Corey Thomas, male    DOB: [birth year], 48 y.o.   MRN: [id number]  HPI  Patient is a very pleasant 48 year old [place] American gentleman who presents today with blurry vision in his right eye. 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 He is unsure of the cause. However recently he has been having a difficult time reading and seeing distant objects.  He denies any rate he lives in his vision, he denies any scotoma. He denies any flashing lights in his vision. He denies any coronal spots. He denies any "descending curtains".    He has a family history of glaucoma in a grandmother. His blood pressures also extremely elevated today at 160/98. Past Medical History  Diagnosis Date  . Hypertension    No current outpatient prescriptions on file prior to visit.   No current facility-administered medications on file prior to visit.   No Known Allergies History   Social History  . Marital Status: Married    Spouse Name: N/A    Number of Children: N/A  . Years of Education: N/A   Occupational History  . Not on file.   Social History Main Topics  . Smoking status: Never Smoker   . Smokeless tobacco: Current User  . Alcohol Use: 1.8 oz/week    3 Cans of beer per week  . Drug Use: No  . Sexual Activity: Yes    Birth Control/ Protection: None   Other Topics Concern  . Not on file   Social History Narrative  . No narrative on file     Review of Systems  All other systems reviewed and are negative.       Objective:   Physical Exam  Vitals reviewed. Constitutional: He is oriented to person, place, and time. He appears well-developed and well-nourished.  Eyes: Conjunctivae and EOM are normal. Pupils are equal, round, and reactive to light. No scleral icterus.  Cardiovascular: Normal rate, regular rhythm and normal heart sounds.   No murmur heard. Pulmonary/Chest: Effort normal and breath sounds normal. No respiratory distress. He  has no wheezes. He has no rales.  Abdominal: Soft. Bowel sounds are normal. He exhibits no distension. There is no tenderness. There is no rebound.  Neurological: He is alert and oriented to person, place, and time. He has normal reflexes. No cranial nerve deficit. He exhibits normal muscle tone. Coordination normal.          Assessment & Plan:  HTN (hypertension)  Blurry vision, right eye - Plan: Ambulatory referral to Ophthalmology  I restarted patient on Benicar 40 mg by mouth daily. Return fasting in one month to recheck his blood pressure and obtain a CMP and fasting lipid panel. I also consult ophthalmology for his blurry vision. He is currently 20/30 in his right eye. Examination of his eyes and funduscopic examination of his eyes normal.

## 2013-06-24 ENCOUNTER — Ambulatory Visit: Payer: Self-pay | Admitting: Family Medicine

## 2013-07-08 ENCOUNTER — Emergency Department (HOSPITAL_COMMUNITY)
Admission: EM | Admit: 2013-07-08 | Discharge: 2013-07-08 | Disposition: A | Discharge status: Home / Self Care | Payer: Medicaid Other | Attending: Emergency Medicine | Admitting: Emergency Medicine

## 2013-07-08 ENCOUNTER — Emergency Department (HOSPITAL_COMMUNITY): Payer: Medicaid Other

## 2013-07-08 ENCOUNTER — Encounter (HOSPITAL_COMMUNITY): Payer: Self-pay | Admitting: Emergency Medicine

## 2013-07-08 DIAGNOSIS — M25559 Pain in unspecified hip: Secondary | ICD-10-CM | POA: Insufficient documentation

## 2013-07-08 DIAGNOSIS — I10 Essential (primary) hypertension: Secondary | ICD-10-CM | POA: Insufficient documentation

## 2013-07-08 DIAGNOSIS — Z79899 Other long term (current) drug therapy: Secondary | ICD-10-CM | POA: Insufficient documentation

## 2013-07-08 DIAGNOSIS — M545 Low back pain, unspecified: Secondary | ICD-10-CM | POA: Insufficient documentation

## 2013-07-08 DIAGNOSIS — M25552 Pain in left hip: Secondary | ICD-10-CM

## 2013-07-08 LAB — URINALYSIS, ROUTINE W REFLEX MICROSCOPIC
Bilirubin Urine: NEGATIVE
Glucose, UA: NEGATIVE mg/dL
HGB URINE DIPSTICK: NEGATIVE
Ketones, ur: NEGATIVE mg/dL
LEUKOCYTES UA: NEGATIVE
Nitrite: NEGATIVE
PH: 6.5 (ref 5.0–8.0)
PROTEIN: NEGATIVE mg/dL
SPECIFIC GRAVITY, URINE: 1.025 (ref 1.005–1.030)
Urobilinogen, UA: 1 mg/dL (ref 0.0–1.0)

## 2013-07-08 MED ORDER — OXYCODONE-ACETAMINOPHEN 5-325 MG PO TABS
2.0000 | ORAL_TABLET | Freq: Once | ORAL | Status: AC
  Administered 2013-07-08: 2 via ORAL
  Filled 2013-07-08: qty 2

## 2013-07-08 MED ORDER — CYCLOBENZAPRINE HCL 10 MG PO TABS
10.0000 mg | ORAL_TABLET | Freq: Once | ORAL | Status: AC
  Administered 2013-07-08: 10 mg via ORAL
  Filled 2013-07-08: qty 1

## 2013-07-08 MED ORDER — CYCLOBENZAPRINE HCL 10 MG PO TABS: 10.0000 mg | ORAL_TABLET | Freq: Three times a day (TID) | ORAL | Status: DC | PRN

## 2013-07-08 MED ORDER — DICLOFENAC SODIUM 75 MG PO TBEC: 75.0000 mg | DELAYED_RELEASE_TABLET | Freq: Two times a day (BID) | ORAL | Status: DC

## 2013-07-08 MED ORDER — OXYCODONE-ACETAMINOPHEN 5-325 MG PO TABS: 1.0000 | ORAL_TABLET | ORAL | Status: DC | PRN

## 2013-07-08 NOTE — ED Provider Notes (Signed)
CSN: [id number]     Arrival date & time 07/08/13  1815 History   First MD Initiated Contact with Patient 07/08/13 1959     Chief Complaint  Patient presents with  . Hip Pain     (Consider location/radiation/quality/duration/timing/severity/associated sxs/prior Treatment) Patient is a 48 y.o. male presenting with hip pain. The history is provided by the patient.  Hip Pain This is a new problem. The current episode started in the past 7 days. The problem occurs constantly. The problem has been unchanged. Associated symptoms include arthralgias. Pertinent negatives include no abdominal pain, change in bowel habit, chest pain, chills, fever, headaches, joint swelling, numbness, rash, urinary symptoms, vomiting or weakness. The symptoms are aggravated by bending, standing and twisting. He has tried nothing for the symptoms. The treatment provided no relief.    Past Medical History  Diagnosis Date  . Hypertension    History reviewed. No pertinent past surgical history. History reviewed. No pertinent family history. History  Substance Use Topics  . Smoking status: Never Smoker   . Smokeless tobacco: Current User    Types: Snuff  . Alcohol Use: 1.8 oz/week    3 Cans of beer per week    Review of Systems  Constitutional: Negative for fever and chills.  Cardiovascular: Negative for chest pain.  Gastrointestinal: Negative for vomiting, abdominal pain and change in bowel habit.  Genitourinary: Negative for dysuria, hematuria, flank pain, difficulty urinating and testicular pain.  Musculoskeletal: Positive for arthralgias and back pain. Negative for gait problem and joint swelling.  Skin: Negative for color change, rash and wound.  Neurological: Negative for weakness, numbness and headaches.  All other systems reviewed and are negative.     Allergies  Review of patient's allergies indicates no known allergies.  Home Medications   Prior to Admission medications   Medication Sig  Start Date End Date Taking? Authorizing Provider  UNKNOWN TO PATIENT Take 1 tablet by mouth daily. BP MEDICATION 50mg    Yes Historical Provider, MD   BP 168/102  Pulse 72  Temp(Src) 98.1 F (36.7 C) (Oral)  Resp 20  Ht 6' (1.829 m)  Wt 201 lb (91.173 kg)  BMI 27.25 kg/m2  SpO2 100% Physical Exam  Nursing note and vitals reviewed. Constitutional: He is oriented to person, place, and time. He appears well-developed and well-nourished. No distress.  HENT:  Head: Normocephalic and atraumatic.  Neck: Normal range of motion. Neck supple.  Cardiovascular: Normal rate, regular rhythm, normal heart sounds and intact distal pulses.   No murmur heard. Pulmonary/Chest: Effort normal and breath sounds normal. No respiratory distress.  Abdominal: Soft. He exhibits no distension. There is no tenderness. There is no rebound and no guarding.  Genitourinary:  No inguinal hernias or masses  Musculoskeletal: He exhibits tenderness. He exhibits no edema.       Lumbar back: He exhibits tenderness and pain. He exhibits normal range of motion, no swelling, no deformity, no laceration and normal pulse.  ttp of the left SI joint and anterolateral left hip.  No spinal tenderness.  DP pulses are brisk and symmetrical.  Distal sensation intact.  Hip Flexors/Extensors are intact. Pain reproduced with internal and external rotation of the hip.   Pt has normal strength against resistance of bilateral lower extremities.     Neurological: He is alert and oriented to person, place, and time. He has normal strength. No sensory deficit. He exhibits normal muscle tone. Coordination and gait normal.  Reflex Scores:      Patellar  reflexes are 2+ on the right side and 2+ on the left side.      Achilles reflexes are 2+ on the right side and 2+ on the left side. Skin: Skin is warm and dry. No rash noted.    ED Course  Procedures (including critical care time) Labs Review Labs Reviewed  URINALYSIS, ROUTINE W REFLEX  MICROSCOPIC    Imaging Review Dg Hip Complete Left  07/08/2013   CLINICAL DATA:  Left hip  EXAM: LEFT HIP - COMPLETE 2+ VIEW  COMPARISON:  Pain  FINDINGS: Three views of the left hip submitted. No acute fracture or subluxation. Mild degenerative changes bilateral hip joints with mild narrowing of superior joint space. Dystrophic calcifications are noted bilateral thigh soft tissue medially.  IMPRESSION: No acute fracture or subluxation. Mild degenerative changes bilateral hip joints.   Electronically Signed   By: Lahoma Crocker M.D.   On: 07/08/2013 21:10     EKG Interpretation None      MDM   Final diagnoses:  Hip pain, left    Pt is ambulatory, no concerning sx;s for neurological or infectious process.  Likely related to deg changes of the joint.    Patient is feeling better. X-ray results were discussed. Patient agrees to followup with his primary care physician at [place]. Vanessa Marne, PA-C 07/10/13 1314

## 2013-07-08 NOTE — Discharge Instructions (Signed)
Arthralgia Arthralgia is joint pain. A joint is a place where two bones meet. Joint pain can happen for many reasons. The joint can be bruised, stiff, infected, or weak from aging. Pain usually goes away after resting and taking medicine for soreness.  HOME CARE  Rest the joint as told by your doctor.  Keep the sore joint raised (elevated) for the first 24 hours.  Put ice on the joint area.  Put ice in a plastic bag.  Place a towel between your skin and the bag.  Leave the ice on for 15-20 minutes, 03-04 times a day.  Wear your splint, casting, elastic bandage, or sling as told by your doctor.  Only take medicine as told by your doctor. Do not take aspirin.  Use crutches as told by your doctor. Do not put weight on the joint until told to by your doctor. GET HELP RIGHT AWAY IF:   You have bruising, puffiness (swelling), or more pain.  Your fingers or toes turn blue or start to lose feeling (numb).  Your medicine does not lessen the pain.  Your pain becomes severe.  You have a temperature by mouth above 102 F (38.9 C), not controlled by medicine.  You cannot move or use the joint. MAKE SURE YOU:   Understand these instructions.  Will watch your condition.  Will get help right away if you are not doing well or get worse. Document Released: 02/23/2009 Document Revised: 05/30/2011 Document Reviewed: 02/23/2009 [facility] Patient Information 2014 [place], [place].  Hip Pain The hips join the upper legs to the lower pelvis. The bones, cartilage, tendons, and muscles of the hip joint perform a lot of work each day holding your body weight and allowing you to move around. Hip pain is a common symptom. It can range from a minor ache to severe pain on 1 or both hips. Pain may be felt on the inside of the hip joint near the groin, or the outside near the buttocks and upper thigh. There may be swelling or stiffness as well. It occurs more often when a person walks or performs  activity. There are many reasons hip pain can develop. CAUSES  It is important to work with your caregiver to identify the cause since many conditions can impact the bones, cartilage, muscles, and tendons of the hips. Causes for hip pain include:  Broken (fractured) bones.  Separation of the thighbone from the hip socket (dislocation).  Torn cartilage of the hip joint.  Swelling (inflammation) of a tendon (tendonitis), the sac within the hip joint (bursitis), or a joint.  A weakening in the abdominal wall (hernia), affecting the nerves to the hip.  Arthritis in the hip joint or lining of the hip joint.  Pinched nerves in the back, hip, or upper thigh.  A bulging disc in the spine (herniated disc).  Rarely, bone infection or cancer. DIAGNOSIS  The location of your hip pain will help your caregiver understand what may be causing the pain. A diagnosis is based on your medical history, your symptoms, results from your physical exam, and results from diagnostic tests. Diagnostic tests may include X-ray exams, a computerized magnetic scan (magnetic resonance imaging, MRI), or bone scan. TREATMENT  Treatment will depend on the cause of your hip pain. Treatment may include:  Limiting activities and resting until symptoms improve.  Crutches or other walking supports (a cane or brace).  Ice, elevation, and compression.  Physical therapy or home exercises.  Shoe inserts or special shoes.  Losing weight.  Medications to reduce pain.  Undergoing surgery. HOME CARE INSTRUCTIONS   Only take over-the-counter or prescription medicines for pain, discomfort, or fever as directed by your caregiver.  Put ice on the injured area:  Put ice in a plastic bag.  Place a towel between your skin and the bag.  Leave the ice on for 15-20 minutes at a time, 03-04 times a day.  Keep your leg raised (elevated) when possible to lessen swelling.  Avoid activities that cause pain.  Follow  specific exercises as directed by your caregiver.  Sleep with a pillow between your legs on your most comfortable side.  Record how often you have hip pain, the location of the pain, and what it feels like. This information may be helpful to you and your caregiver.  Ask your caregiver about returning to work or sports and whether you should drive.  Follow up with your caregiver for further exams, therapy, or testing as directed. SEEK MEDICAL CARE IF:   Your pain or swelling continues or worsens after 1 week.  You are feeling unwell or have chills.  You have increasing difficulty with walking.  You have a loss of sensation or other new symptoms.  You have questions or concerns. SEEK IMMEDIATE MEDICAL CARE IF:   You cannot put weight on the affected hip.  You have fallen.  You have a sudden increase in pain and swelling in your hip.  You have a fever. MAKE SURE YOU:   Understand these instructions.  Will watch your condition.  Will get help right away if you are not doing well or get worse. Document Released: 08/25/2009 Document Revised: 05/30/2011 Document Reviewed: 08/25/2009 [facility] Patient Information 2014 [place].

## 2013-07-08 NOTE — ED Notes (Signed)
Pain lt hip, no known injury.

## 2013-07-17 NOTE — ED Provider Notes (Signed)
Medical screening examination/treatment/procedure(s) were performed by non-physician practitioner and as supervising physician I was immediately available for consultation/collaboration.   EKG Interpretation None      Rolland Porter, MD, Abram Sander   Janice Norrie, MD 07/17/13 [phone]

## 2013-08-12 ENCOUNTER — Emergency Department (HOSPITAL_COMMUNITY)
Admission: EM | Admit: 2013-08-12 | Discharge: 2013-08-12 | Disposition: A | Discharge status: Home / Self Care | Payer: Medicaid Other | Attending: Emergency Medicine | Admitting: Emergency Medicine

## 2013-08-12 ENCOUNTER — Encounter (HOSPITAL_COMMUNITY): Payer: Self-pay | Admitting: Emergency Medicine

## 2013-08-12 DIAGNOSIS — S1096XA Insect bite of unspecified part of neck, initial encounter: Secondary | ICD-10-CM | POA: Insufficient documentation

## 2013-08-12 DIAGNOSIS — I10 Essential (primary) hypertension: Secondary | ICD-10-CM | POA: Insufficient documentation

## 2013-08-12 DIAGNOSIS — Z791 Long term (current) use of non-steroidal anti-inflammatories (NSAID): Secondary | ICD-10-CM | POA: Insufficient documentation

## 2013-08-12 DIAGNOSIS — Z79899 Other long term (current) drug therapy: Secondary | ICD-10-CM | POA: Insufficient documentation

## 2013-08-12 DIAGNOSIS — W57XXXA Bitten or stung by nonvenomous insect and other nonvenomous arthropods, initial encounter: Secondary | ICD-10-CM | POA: Insufficient documentation

## 2013-08-12 DIAGNOSIS — Y929 Unspecified place or not applicable: Secondary | ICD-10-CM | POA: Insufficient documentation

## 2013-08-12 DIAGNOSIS — Y939 Activity, unspecified: Secondary | ICD-10-CM | POA: Insufficient documentation

## 2013-08-12 MED ORDER — TRIAMCINOLONE ACETONIDE 0.5 % EX OINT: 1.0000 "application " | TOPICAL_OINTMENT | Freq: Two times a day (BID) | CUTANEOUS | Status: DC

## 2013-08-12 MED ORDER — CETIRIZINE HCL 10 MG PO TABS: 10.0000 mg | ORAL_TABLET | Freq: Every day | ORAL | Status: DC

## 2013-08-12 NOTE — Discharge Instructions (Signed)
Bedbugs Bedbugs are tiny bugs that live in and around beds. During the day, they hide in mattresses and other places near beds. They come out at night and bite people lying in bed. They need blood to live and grow. Bedbugs can be found in beds anywhere. Usually, they are found in places where many people come and go (hotels, shelters, hospitals). It does not matter whether the place is dirty or clean. Getting bitten by bedbugs rarely causes a medical problem. The biggest problem can be getting rid of them. This often takes the work of a Financial risk analyst. CAUSES  Less use of pesticides. Bedbugs were common before the 1950s. Then, strong pesticides such as DDT nearly wiped them out. Today, these pesticides are not used because they harm the environment and can cause health problems.  More travel. Besides mattresses, bedbugs can also live in clothing and luggage. They can come along as people travel from place to place. Bedbugs are more common in certain parts of the world. When people travel to those areas, the bugs can come home with them.  Presence of birds and bats. Bedbugs often infest birds and bats. If you have these animals in or near your home, bedbugs may infest your house, too. SYMPTOMS It does not hurt to be bitten by a bedbug. You will probably not wake up when you are bitten. Bedbugs usually bite areas of the skin that are not covered. Symptoms may show when you wake up, or they may take a day or more to show up. Symptoms may include:  Small red bumps on the skin. These might be lined up in a row or clustered in a group.  A darker red dot in the middle of red bumps.  Blisters on the skin. There may be swelling and very bad itching. These may be signs of an allergic reaction. This does not happen often. DIAGNOSIS Bedbug bites might look and feel like other types of insect bites. The bugs do not stay on the body like ticks or lice. They bite, drop off, and crawl away to hide. Your  caregiver will probably:  Ask about your symptoms.  Ask about your recent activities and travel.  Check your skin for bedbug bites.  Ask you to check at home for signs of bedbugs. You should look for:  Spots or stains on the bed or nearby. This could be from bedbugs that were crushed or from their eggs or waste.  Bedbugs themselves. They are reddish-brown, oval, and flat. They do not fly. They are about the size of an apple seed.  Places to look for bedbugs include:  Beds. Check mattresses, headboards, box springs, and bed frames.  On drapes and curtains near the bed.  Under carpeting in the bedroom.  Behind electrical outlets.  Behind any wallpaper that is peeling.  Inside luggage. TREATMENT Most bedbug bites do not need treatment. They usually go away on their own in a few days. The bites are not dangerous. However, treatment may be needed if you have scratched so much that your skin has become infected. You may also need treatment if you are allergic to bedbug bites. Treatment options include:  A drug that stops swelling and itching (corticosteroid). Usually, a cream is rubbed on the skin. If you have a bad rash, you may be given a corticosteroid pill.  Oral antihistamines. These are pills to help control itching.  Antibiotic medicines. An antibiotic may be prescribed for infected skin. HOME CARE INSTRUCTIONS  given a corticosteroid pill.  · Oral antihistamines. These are pills to help control itching.  · Antibiotic medicines. An antibiotic may be prescribed for infected skin.  HOME CARE INSTRUCTIONS   · Take any medicine prescribed by your caregiver for your bites. Follow the directions carefully.  · Consider wearing pajamas with long sleeves and pant legs.  · Your bedroom may need to be treated. A pest control expert should make sure the bedbugs are gone. You may need to throw away mattresses or luggage. Ask the pest control expert what you can do to keep the bedbugs from coming back. Common suggestions include:  · Putting a plastic cover over your mattress.  · Washing and drying your clothes and bedding in hot water and a hot dryer. The temperature should be hotter  than 120° F (48.9° C). Bedbugs are killed by high temperatures.  · Vacuuming carefully all around your bed. Vacuum in all cracks and crevices where the bugs might hide. Do this often.  · Carefully checking all used furniture, bedding, or clothes that you bring into your house.  · Eliminating bird nests and bat roosts.  · If you get bedbug bites when traveling, check all your possessions carefully before bringing them into your house. If you find any bugs on clothes or in your luggage, consider throwing those items away.  SEEK MEDICAL CARE IF:  · You have red bug bites that keep coming back.  · You have red bug bites that itch badly.  · You have bug bites that cause a skin rash.  · You have scratch marks that are red and sore.  SEEK IMMEDIATE MEDICAL CARE IF:  You have a fever.  Document Released: 04/09/2010 Document Revised: 05/30/2011 Document Reviewed: 04/09/2010  [employer]® Patient Information ©2014 [employer].

## 2013-08-12 NOTE — ED Provider Notes (Signed)
CSN: [id number]     Arrival date & time 08/12/13  2240 History   First MD Initiated Contact with Patient 08/12/13 2302     No chief complaint on file.    (Consider location/radiation/quality/duration/timing/severity/associated sxs/prior Treatment) Patient is a 48 y.o. male presenting with rash. The history is provided by the patient.  Rash Location:  Head/neck, leg and torso Head/neck rash location:  Scalp Torso rash location:  Upper back and lower back Leg rash location:  R upper leg, L upper leg, L lower leg and R lower leg Quality: itchiness and redness   Severity:  Moderate Onset quality:  Gradual Duration:  2 days Timing:  Constant Progression:  Worsening Chronicity:  New Relieved by:  None tried Worsened by:  Heat Ineffective treatments:  None tried  EARNESTINE TUOHEY is a 48 y.o. male who presents to the ED with a rash that started after he and his family stayed over night in a Motel. He complains of severe itching. He also has a swollen tender lymph node to the right side of his neck.   Past Medical History  Diagnosis Date  . Hypertension    History reviewed. No pertinent past surgical history. History reviewed. No pertinent family history. History  Substance Use Topics  . Smoking status: Never Smoker   . Smokeless tobacco: Current User    Types: Snuff  . Alcohol Use: 1.8 oz/week    3 Cans of beer per week    Review of Systems  Skin: Positive for rash.  swollen gland of neck Otherwise negative     Allergies  Review of patient's allergies indicates no known allergies.  Home Medications   Prior to Admission medications   Medication Sig Start Date End Date Taking? Authorizing Provider  cyclobenzaprine (FLEXERIL) 10 MG tablet Take 1 tablet (10 mg total) by mouth 3 (three) times daily as needed. 07/08/13   Tammy L. Triplett, PA-C  diclofenac (VOLTAREN) 75 MG EC tablet Take 1 tablet (75 mg total) by mouth 2 (two) times daily. Take with food 07/08/13   Tammy L.  Triplett, PA-C  oxyCODONE-acetaminophen (PERCOCET/ROXICET) 5-325 MG per tablet Take 1 tablet by mouth every 4 (four) hours as needed for severe pain. 07/08/13   Tammy L. Triplett, PA-C  UNKNOWN TO PATIENT Take 1 tablet by mouth daily. BP MEDICATION 50mg     Historical Provider, MD   BP 152/87  Pulse 62  Temp(Src) 98.1 F (36.7 C) (Oral)  Resp 18  Ht 6' (1.829 m)  Wt 205 lb (92.987 kg)  BMI 27.80 kg/m2  SpO2 100% Physical Exam  Nursing note and vitals reviewed. Constitutional: He is oriented to person, place, and time. He appears well-developed and well-nourished. No distress.  HENT:  Head: Normocephalic.  Eyes: Conjunctivae and EOM are normal.  Neck: Normal range of motion. Neck supple.  Cardiovascular: Normal rate and regular rhythm.   Pulmonary/Chest: Effort normal. He has no wheezes. He has no rales.  Musculoskeletal: Normal range of motion.  See skin exam  Neurological: He is alert and oriented to person, place, and time. No cranial nerve deficit.  Skin: Skin is warm and dry. Rash noted.  Red raised rash noted to scalp, upper and lower extremities and back.  Psychiatric: He has a normal mood and affect. His behavior is normal.    ED Course  Procedures   MDM  48 y.o. male with rash and itching x 2 days. Stable for discharge without signs of infection. Discussed with the patient clinical findings  and plan of care and all questioned fully answered. He will return if any problems arise.    Medication List    TAKE these medications       cetirizine 10 MG tablet  Commonly known as:  ZYRTEC  Take 1 tablet (10 mg total) by mouth daily.     triamcinolone ointment 0.5 %  Commonly known as:  KENALOG  Apply 1 application topically 2 (two) times daily.      ASK your doctor about these medications       cyclobenzaprine 10 MG tablet  Commonly known as:  FLEXERIL  Take 1 tablet (10 mg total) by mouth 3 (three) times daily as needed.     diclofenac 75 MG EC tablet  Commonly  known as:  VOLTAREN  Take 1 tablet (75 mg total) by mouth 2 (two) times daily. Take with food     oxyCODONE-acetaminophen 5-325 MG per tablet  Commonly known as:  PERCOCET/ROXICET  Take 1 tablet by mouth every 4 (four) hours as needed for severe pain.     UNKNOWN TO PATIENT  Take 1 tablet by mouth daily. BP MEDICATION 50mg           Ashley Mur[place], [place] 08/12/13 [phone]

## 2013-08-12 NOTE — ED Notes (Signed)
Insect bites , that pt says is bed bugs.

## 2013-08-13 NOTE — ED Provider Notes (Signed)
Medical screening examination/treatment/procedure(s) were performed by non-physician practitioner and as supervising physician I was immediately available for consultation/collaboration.     Veryl Speak, MD 08/13/13 Dyann Kief

## 2013-10-03 ENCOUNTER — Emergency Department (HOSPITAL_COMMUNITY)
Admission: EM | Admit: 2013-10-03 | Discharge: 2013-10-03 | Disposition: A | Discharge status: Home / Self Care | Payer: Medicaid Other | Attending: Emergency Medicine | Admitting: Emergency Medicine

## 2013-10-03 ENCOUNTER — Encounter (HOSPITAL_COMMUNITY): Payer: Self-pay | Admitting: Emergency Medicine

## 2013-10-03 DIAGNOSIS — IMO0002 Reserved for concepts with insufficient information to code with codable children: Secondary | ICD-10-CM | POA: Insufficient documentation

## 2013-10-03 DIAGNOSIS — M79652 Pain in left thigh: Secondary | ICD-10-CM

## 2013-10-03 DIAGNOSIS — I10 Essential (primary) hypertension: Secondary | ICD-10-CM | POA: Insufficient documentation

## 2013-10-03 DIAGNOSIS — Z791 Long term (current) use of non-steroidal anti-inflammatories (NSAID): Secondary | ICD-10-CM | POA: Insufficient documentation

## 2013-10-03 DIAGNOSIS — M25559 Pain in unspecified hip: Secondary | ICD-10-CM | POA: Diagnosis not present

## 2013-10-03 DIAGNOSIS — Z79899 Other long term (current) drug therapy: Secondary | ICD-10-CM | POA: Diagnosis not present

## 2013-10-03 MED ORDER — OXYCODONE-ACETAMINOPHEN 5-325 MG PO TABS
1.0000 | ORAL_TABLET | Freq: Once | ORAL | Status: AC
  Administered 2013-10-03: 1 via ORAL
  Filled 2013-10-03: qty 1

## 2013-10-03 MED ORDER — IBUPROFEN 800 MG PO TABS
800.0000 mg | ORAL_TABLET | Freq: Once | ORAL | Status: AC
  Administered 2013-10-03: 800 mg via ORAL
  Filled 2013-10-03: qty 1

## 2013-10-03 MED ORDER — OXYCODONE-ACETAMINOPHEN 5-325 MG PO TABS: 1.0000 | ORAL_TABLET | ORAL | Status: DC | PRN

## 2013-10-03 MED ORDER — NAPROXEN 500 MG PO TABS: 500.0000 mg | ORAL_TABLET | Freq: Two times a day (BID) | ORAL | Status: DC

## 2013-10-03 MED ORDER — CYCLOBENZAPRINE HCL 10 MG PO TABS: 10.0000 mg | ORAL_TABLET | Freq: Two times a day (BID) | ORAL | Status: DC | PRN

## 2013-10-03 MED ORDER — CYCLOBENZAPRINE HCL 10 MG PO TABS
10.0000 mg | ORAL_TABLET | Freq: Once | ORAL | Status: AC
  Administered 2013-10-03: 10 mg via ORAL
  Filled 2013-10-03: qty 1

## 2013-10-03 NOTE — ED Notes (Signed)
Pain in left hip and down left leg for a couple of weeks, denies injury or trauma.

## 2013-10-03 NOTE — ED Provider Notes (Signed)
CSN: [id number]     Arrival date & time 10/03/13  0032 History   First MD Initiated Contact with Patient 10/03/13 0104     Chief complaint: Pain left thigh  (Consider location/radiation/quality/duration/timing/severity/associated sxs/prior Treatment) The history is provided by the patient.   48 year old male complains of pain in his left upper leg for the last 2-3 weeks. He describes a dull, achy pain which seems to go from his hip area down to his knee. There is no pain radiating up to the back and no pain below the knee. Pain is moderate to severe and he has rated it as high as 8/10. 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 When he is riding in a car, it sometimes bothers him as he gets out of the car. Pain is worse if he lays on his left side and he has been having difficulty sleeping because of that. He notices that it is painful to hold his leg up so he can put on his socks. This is similar to an episode he had 2 months ago when seen in the ED and was given prescriptions for diclofenac and cyclobenzaprine. He started taking them again several days ago and they do give him temporary relief. He denies any weakness, numbness, tingling. He denies any bowel or bladder dysfunction. He denies any recent trauma.  Past Medical History  Diagnosis Date  . Hypertension    History reviewed. No pertinent past surgical history. No family history on file. History  Substance Use Topics  . Smoking status: Never Smoker   . Smokeless tobacco: Current User    Types: Snuff  . Alcohol Use: 1.8 oz/week    3 Cans of beer per week    Review of Systems  All other systems reviewed and are negative.     Allergies  Review of patient's allergies indicates no known allergies.  Home Medications   Prior to Admission medications   Medication Sig Start Date End Date Taking? Authorizing Provider  cetirizine (ZYRTEC) 10 MG tablet Take 1 tablet (10 mg total) by  mouth daily. 08/12/13  Yes Hope Bunnie Pion, NP  cyclobenzaprine (FLEXERIL) 10 MG tablet Take 1 tablet (10 mg total) by mouth 3 (three) times daily as needed. 07/08/13  Yes Tammy L. Triplett, PA-C  diclofenac (VOLTAREN) 75 MG EC tablet Take 1 tablet (75 mg total) by mouth 2 (two) times daily. Take with food 07/08/13  Yes Tammy L. Triplett, PA-C  oxyCODONE-acetaminophen (PERCOCET/ROXICET) 5-325 MG per tablet Take 1 tablet by mouth every 4 (four) hours as needed for severe pain. 07/08/13   Tammy L. Triplett, PA-C  triamcinolone ointment (KENALOG) 0.5 % Apply 1 application topically 2 (two) times daily. 08/12/13   Hope Bunnie Pion, NP  UNKNOWN TO PATIENT Take 1 tablet by mouth daily. BP MEDICATION 50mg     Historical Provider, MD   BP 156/106  Pulse 66  Temp(Src) 98.2 F (36.8 C) (Oral)  Resp 16  Ht 6' (1.829 m)  Wt 205 lb (92.987 kg)  BMI 27.80 kg/m2  SpO2 100% Physical Exam  Nursing note and vitals reviewed.  48 year old male, resting comfortably and in no acute distress. Vital signs are significant for hypertension with blood pressure 156/106. Oxygen saturation is 100%, which is normal. Head is normocephalic and atraumatic. PERRLA, EOMI. Oropharynx is clear. Neck is nontender and supple without adenopathy or JVD. Back is nontender and there is no CVA tenderness.  Straight leg raise is negative. Lungs are clear without rales, wheezes, or rhonchi. Chest is nontender. Heart has regular rate and rhythm without murmur. Abdomen is soft, flat, nontender without masses or hepatosplenomegaly and peristalsis is normoactive. Extremities have no cyanosis or edema, full range of motion is present. Pain is elicited with external rotation of the left leg. There is tenderness to palpation over the medial aspect of the proximal left thigh. Distal neurovascular exam is intact with strong pulses, normal sensation, and prompt capillary refill. Skin is warm and dry without rash. Neurologic: Mental status is normal, cranial  nerves are intact, there are no motor or sensory deficits. There is normal strength of all muscle groups of the lower extremities.  ED Course  Procedures (including critical care time)  MDM   Final diagnoses:  Musculoskeletal pain of left thigh    Musculoskeletal pain of the left thigh. Specifically, he seems to be having difficulty with his sartorius muscle. Old records are reviewed and he was seen in the ED 2 months ago and had negative hip x-rays at that time. He will be discharged with prescriptions for naproxen, cyclobenzaprine, and oxycodone-acetaminophen and is referred back to his PCP for followup. I have advised him to ice his sore areas before and after activity. He may benefit from a course of physical therapy.    Delora Fuel, MD 35/[id number]/56 [id number]

## 2013-10-03 NOTE — Discharge Instructions (Signed)
Muscle Strain A muscle strain is an injury that occurs when a muscle is stretched beyond its normal length. Usually a small number of muscle fibers are torn when this happens. Muscle strain is rated in degrees. First-degree strains have the least amount of muscle fiber tearing and pain. Second-degree and third-degree strains have increasingly more tearing and pain.  Usually, recovery from muscle strain takes 1-2 weeks. Complete healing takes 5-6 weeks.  CAUSES  Muscle strain happens when a sudden, violent force placed on a muscle stretches it too far. This may occur with lifting, sports, or a fall.  RISK FACTORS Muscle strain is especially common in athletes.  SIGNS AND SYMPTOMS At the site of the muscle strain, there may be:  Pain.  Bruising.  Swelling.  Difficulty using the muscle due to pain or lack of normal function. DIAGNOSIS  Your health care provider will perform a physical exam and ask about your medical history. TREATMENT  Often, the best treatment for a muscle strain is resting, icing, and applying cold compresses to the injured area.  HOME CARE INSTRUCTIONS   Use the PRICE method of treatment to promote muscle healing during the first 2-3 days after your injury. The PRICE method involves:  Protecting the muscle from being injured again.  Restricting your activity and resting the injured body part.  Icing your injury. To do this, put ice in a plastic bag. Place a towel between your skin and the bag. Then, apply the ice and leave it on from 15-20 minutes each hour. After the third day, switch to moist heat packs.  Apply compression to the injured area with a splint or elastic bandage. Be careful not to wrap it too tightly. This may interfere with blood circulation or increase swelling.  Elevate the injured body part above the level of your heart as often as you can.  Only take over-the-counter or prescription medicines for pain, discomfort, or fever as directed by your  health care provider.  Warming up prior to exercise helps to prevent future muscle strains. SEEK MEDICAL CARE IF:   You have increasing pain or swelling in the injured area.  You have numbness, tingling, or a significant loss of strength in the injured area. MAKE SURE YOU:   Understand these instructions.  Will watch your condition.  Will get help right away if you are not doing well or get worse. Document Released: 03/07/2005 Document Revised: 12/26/2012 Document Reviewed: 10/04/2012 [facility] Patient Information 2015 [place], [place]. This information is not intended to replace advice given to you by your health care provider. Make sure you discuss any questions you have with your health care provider.  Naproxen and naproxen sodium oral immediate-release tablets What is this medicine? NAPROXEN (na PROX en) is a non-steroidal anti-inflammatory drug (NSAID). It is used to reduce swelling and to treat pain. This medicine may be used for dental pain, headache, or painful monthly periods. It is also used for painful joint and muscular problems such as arthritis, tendinitis, bursitis, and gout. This medicine may be used for other purposes; ask your health care provider or pharmacist if you have questions. COMMON BRAND NAME(S): Aflaxen, Aleve, Aleve Arthritis, All Day Relief, Anaprox, Anaprox DS, Naprosyn What should I tell my health care provider before I take this medicine? They need to know if you have any of these conditions: -asthma -cigarette smoker -drink more than 3 alcohol containing drinks a day -heart disease or circulation problems such as heart failure or leg edema (fluid retention) -high  blood pressure -kidney disease -liver disease -stomach bleeding or ulcers -an unusual or allergic reaction to naproxen, aspirin, other NSAIDs, other medicines, foods, dyes, or preservatives -pregnant or trying to get pregnant -breast-feeding How should I use this medicine? Take this  medicine by mouth with a glass of water. Follow the directions on the prescription label. Take it with food if your stomach gets upset. Try to not lie down for at least 10 minutes after you take it. Take your medicine at regular intervals. Do not take your medicine more often than directed. Long-term, continuous use may increase the risk of heart attack or stroke. A special MedGuide will be given to you by the pharmacist with each prescription and refill. Be sure to read this information carefully each time. Talk to your pediatrician regarding the use of this medicine in children. Special care may be needed. Overdosage: If you think you have taken too much of this medicine contact a poison control center or emergency room at once. NOTE: This medicine is only for you. Do not share this medicine with others. What if I miss a dose? If you miss a dose, take it as soon as you can. If it is almost time for your next dose, take only that dose. Do not take double or extra doses. What may interact with this medicine? -alcohol -aspirin -cidofovir -diuretics -lithium -methotrexate -other drugs for inflammation like ketorolac or prednisone -pemetrexed -probenecid -warfarin This list may not describe all possible interactions. Give your health care provider a list of all the medicines, herbs, non-prescription drugs, or dietary supplements you use. Also tell them if you smoke, drink alcohol, or use illegal drugs. Some items may interact with your medicine. What should I watch for while using this medicine? Tell your doctor or health care professional if your pain does not get better. Talk to your doctor before taking another medicine for pain. Do not treat yourself. This medicine does not prevent heart attack or stroke. In fact, this medicine may increase the chance of a heart attack or stroke. The chance may increase with longer use of this medicine and in people who have heart disease. If you take aspirin  to prevent heart attack or stroke, talk with your doctor or health care professional. Do not take other medicines that contain aspirin, ibuprofen, or naproxen with this medicine. Side effects such as stomach upset, nausea, or ulcers may be more likely to occur. Many medicines available without a prescription should not be taken with this medicine. This medicine can cause ulcers and bleeding in the stomach and intestines at any time during treatment. Do not smoke cigarettes or drink alcohol. These increase irritation to your stomach and can make it more susceptible to damage from this medicine. Ulcers and bleeding can happen without warning symptoms and can cause death. You may get drowsy or dizzy. Do not drive, use machinery, or do anything that needs mental alertness until you know how this medicine affects you. Do not stand or sit up quickly, especially if you are an older patient. This reduces the risk of dizzy or fainting spells. This medicine can cause you to bleed more easily. Try to avoid damage to your teeth and gums when you brush or floss your teeth. What side effects may I notice from receiving this medicine? Side effects that you should report to your doctor or health care professional as soon as possible: -black or bloody stools, blood in the urine or vomit -blurred vision -chest pain -difficulty breathing  or wheezing -nausea or vomiting -severe stomach pain -skin rash, skin redness, blistering or peeling skin, hives, or itching -slurred speech or weakness on one side of the body -swelling of eyelids, throat, lips -unexplained weight gain or swelling -unusually weak or tired -yellowing of eyes or skin Side effects that usually do not require medical attention (report to your doctor or health care professional if they continue or are bothersome): -constipation -headache -heartburn This list may not describe all possible side effects. Call your doctor for medical advice about side  effects. You may report side effects to FDA at 1-800-FDA-1088. Where should I keep my medicine? Keep out of the reach of children. Store at room temperature between 15 and 30 degrees C (59 and 86 degrees F). Keep container tightly closed. Throw away any unused medicine after the expiration date. NOTE: This sheet is a summary. It may not cover all possible information. If you have questions about this medicine, talk to your doctor, pharmacist, or health care provider.  2015, [employer] Standard. (2009-03-09 20:10:16)  Cyclobenzaprine tablets What is this medicine? CYCLOBENZAPRINE (sye kloe BEN za preen) is a muscle relaxer. It is used to treat muscle pain, spasms, and stiffness. This medicine may be used for other purposes; ask your health care provider or pharmacist if you have questions. COMMON BRAND NAME(S): Fexmid, Flexeril What should I tell my health care provider before I take this medicine? They need to know if you have any of these conditions: -heart disease, irregular heartbeat, or previous heart attack -liver disease -thyroid problem -an unusual or allergic reaction to cyclobenzaprine, tricyclic antidepressants, lactose, other medicines, foods, dyes, or preservatives -pregnant or trying to get pregnant -breast-feeding How should I use this medicine? Take this medicine by mouth with a glass of water. Follow the directions on the prescription label. If this medicine upsets your stomach, take it with food or milk. Take your medicine at regular intervals. Do not take it more often than directed. Talk to your pediatrician regarding the use of this medicine in children. Special care may be needed. Overdosage: If you think you have taken too much of this medicine contact a poison control center or emergency room at once. NOTE: This medicine is only for you. Do not share this medicine with others. What if I miss a dose? If you miss a dose, take it as soon as you can. If it is almost  time for your next dose, take only that dose. Do not take double or extra doses. What may interact with this medicine? Do not take this medicine with any of the following medications: -certain medicines for fungal infections like fluconazole, itraconazole, ketoconazole, posaconazole, voriconazole -cisapride -dofetilide -dronedarone -droperidol -flecainide -grepafloxacin -halofantrine -levomethadyl -MAOIs like Carbex, Eldepryl, Marplan, Nardil, and Parnate -nilotinib -pimozide -probucol -sertindole -thioridazine -ziprasidone This medicine may also interact with the following medications: -abarelix -alcohol -certain medicines for cancer -certain medicines for depression, anxiety, or psychotic disturbances -certain medicines for infection like alfuzosin, chloroquine, clarithromycin, levofloxacin, mefloquine, pentamidine, troleandomycin -certain medicines for an irregular heart beat -certain medicines used for sleep or numbness during surgery or procedure -contrast dyes -dolasetron -guanethidine -methadone -octreotide -ondansetron -other medicines that prolong the QT interval (cause an abnormal heart rhythm) -palonosetron -phenothiazines like chlorpromazine, mesoridazine, prochlorperazine, thioridazine -tramadol -vardenafil This list may not describe all possible interactions. Give your health care provider a list of all the medicines, herbs, non-prescription drugs, or dietary supplements you use. Also tell them if you smoke, drink alcohol, or use illegal drugs. Some  items may interact with your medicine. What should I watch for while using this medicine? Check with your doctor or health care professional if your condition does not improve within 1 to 3 weeks. You may get drowsy or dizzy when you first start taking the medicine or change doses. Do not drive, use machinery, or do anything that may be dangerous until you know how the medicine affects you. Stand or sit up  slowly. Your mouth may get dry. Drinking water, chewing sugarless gum, or sucking on hard candy may help. What side effects may I notice from receiving this medicine? Side effects that you should report to your doctor or health care professional as soon as possible: -allergic reactions like skin rash, itching or hives, swelling of the face, lips, or tongue -chest pain -fast heartbeat -hallucinations -seizures -vomiting Side effects that usually do not require medical attention (report to your doctor or health care professional if they continue or are bothersome): -headache This list may not describe all possible side effects. Call your doctor for medical advice about side effects. You may report side effects to FDA at 1-800-FDA-1088. Where should I keep my medicine? Keep out of the reach of children. Store at room temperature between 15 and 30 degrees C (59 and 86 degrees F). Keep container tightly closed. Throw away any unused medicine after the expiration date. NOTE: This sheet is a summary. It may not cover all possible information. If you have questions about this medicine, talk to your doctor, pharmacist, or health care provider.  2015, [employer] Standard. (2012-10-02 12:48:19)  Acetaminophen; Oxycodone tablets What is this medicine? ACETAMINOPHEN; OXYCODONE (a set a MEE noe fen; ox i KOE done) is a pain reliever. It is used to treat mild to moderate pain. This medicine may be used for other purposes; ask your health care provider or pharmacist if you have questions. COMMON BRAND NAME(S): Endocet, Magnacet, Narvox, Percocet, Perloxx, Primalev, Primlev, Roxicet, Xolox What should I tell my health care provider before I take this medicine? They need to know if you have any of these conditions: -brain tumor -Crohn's disease, inflammatory bowel disease, or ulcerative colitis -drug abuse or addiction -head injury -heart or circulation problems -if you often drink alcohol -kidney  disease or problems going to the bathroom -liver disease -lung disease, asthma, or breathing problems -an unusual or allergic reaction to acetaminophen, oxycodone, other opioid analgesics, other medicines, foods, dyes, or preservatives -pregnant or trying to get pregnant -breast-feeding How should I use this medicine? Take this medicine by mouth with a full glass of water. Follow the directions on the prescription label. Take your medicine at regular intervals. Do not take your medicine more often than directed. Talk to your pediatrician regarding the use of this medicine in children. Special care may be needed. Patients over 35 years old may have a stronger reaction and need a smaller dose. Overdosage: If you think you have taken too much of this medicine contact a poison control center or emergency room at once. NOTE: This medicine is only for you. Do not share this medicine with others. What if I miss a dose? If you miss a dose, take it as soon as you can. If it is almost time for your next dose, take only that dose. Do not take double or extra doses. What may interact with this medicine? -alcohol -antihistamines -barbiturates like amobarbital, butalbital, butabarbital, methohexital, pentobarbital, phenobarbital, thiopental, and secobarbital -benztropine -drugs for bladder problems like solifenacin, trospium, oxybutynin, tolterodine, hyoscyamine, and methscopolamine -  drugs for breathing problems like ipratropium and tiotropium -drugs for certain stomach or intestine problems like propantheline, homatropine methylbromide, glycopyrrolate, atropine, belladonna, and dicyclomine -general anesthetics like etomidate, ketamine, nitrous oxide, propofol, desflurane, enflurane, halothane, isoflurane, and sevoflurane -medicines for depression, anxiety, or psychotic disturbances -medicines for sleep -muscle relaxants -naltrexone -narcotic medicines (opiates) for pain -phenothiazines like  perphenazine, thioridazine, chlorpromazine, mesoridazine, fluphenazine, prochlorperazine, promazine, and trifluoperazine -scopolamine -tramadol -trihexyphenidyl This list may not describe all possible interactions. Give your health care provider a list of all the medicines, herbs, non-prescription drugs, or dietary supplements you use. Also tell them if you smoke, drink alcohol, or use illegal drugs. Some items may interact with your medicine. What should I watch for while using this medicine? Tell your doctor or health care professional if your pain does not go away, if it gets worse, or if you have new or a different type of pain. You may develop tolerance to the medicine. Tolerance means that you will need a higher dose of the medication for pain relief. Tolerance is normal and is expected if you take this medicine for a long time. Do not suddenly stop taking your medicine because you may develop a severe reaction. Your body becomes used to the medicine. This does NOT mean you are addicted. Addiction is a behavior related to getting and using a drug for a non-medical reason. If you have pain, you have a medical reason to take pain medicine. Your doctor will tell you how much medicine to take. If your doctor wants you to stop the medicine, the dose will be slowly lowered over time to avoid any side effects. You may get drowsy or dizzy. Do not drive, use machinery, or do anything that needs mental alertness until you know how this medicine affects you. Do not stand or sit up quickly, especially if you are an older patient. This reduces the risk of dizzy or fainting spells. Alcohol may interfere with the effect of this medicine. Avoid alcoholic drinks. There are different types of narcotic medicines (opiates) for pain. If you take more than one type at the same time, you may have more side effects. Give your health care provider a list of all medicines you use. Your doctor will tell you how much medicine to  take. Do not take more medicine than directed. Call emergency for help if you have problems breathing. The medicine will cause constipation. Try to have a bowel movement at least every 2 to 3 days. If you do not have a bowel movement for 3 days, call your doctor or health care professional. Do not take Tylenol (acetaminophen) or medicines that have acetaminophen with this medicine. Too much acetaminophen can be very dangerous. Many nonprescription medicines contain acetaminophen. Always read the labels carefully to avoid taking more acetaminophen. What side effects may I notice from receiving this medicine? Side effects that you should report to your doctor or health care professional as soon as possible: -allergic reactions like skin rash, itching or hives, swelling of the face, lips, or tongue -breathing difficulties, wheezing -confusion -light headedness or fainting spells -severe stomach pain -unusually weak or tired -yellowing of the skin or the whites of the eyes Side effects that usually do not require medical attention (report to your doctor or health care professional if they continue or are bothersome): -dizziness -drowsiness -nausea -vomiting This list may not describe all possible side effects. Call your doctor for medical advice about side effects. You may report side effects to FDA at 1-800-FDA-1088.  Where should I keep my medicine? Keep out of the reach of children. This medicine can be abused. Keep your medicine in a safe place to protect it from theft. Do not share this medicine with anyone. Selling or giving away this medicine is dangerous and against the law. Store at room temperature between 20 and 25 degrees C (68 and 77 degrees F). Keep container tightly closed. Protect from light. This medicine may cause accidental overdose and death if it is taken by other adults, children, or pets. Flush any unused medicine down the toilet to reduce the chance of harm. Do not use the  medicine after the expiration date. NOTE: This sheet is a summary. It may not cover all possible information. If you have questions about this medicine, talk to your doctor, pharmacist, or health care provider.  2015, [employer] Standard. (2012-10-29 13:17:35)

## 2013-10-07 MED FILL — Oxycodone w/ Acetaminophen Tab 5-325 MG: ORAL | Qty: 6 | Status: AC

## 2013-10-14 ENCOUNTER — Encounter: Payer: Self-pay | Admitting: Physician Assistant

## 2013-10-14 ENCOUNTER — Ambulatory Visit (INDEPENDENT_AMBULATORY_CARE_PROVIDER_SITE_OTHER): Payer: Medicaid Other | Admitting: Physician Assistant

## 2013-10-14 VITALS — BP 148/102 | HR 88 | Temp 98.1°F | Resp 18 | Wt 194.0 lb

## 2013-10-14 DIAGNOSIS — I10 Essential (primary) hypertension: Secondary | ICD-10-CM | POA: Insufficient documentation

## 2013-10-14 DIAGNOSIS — M5430 Sciatica, unspecified side: Secondary | ICD-10-CM

## 2013-10-14 DIAGNOSIS — M5442 Lumbago with sciatica, left side: Secondary | ICD-10-CM

## 2013-10-14 MED ORDER — LOSARTAN POTASSIUM 50 MG PO TABS: 50.0000 mg | ORAL_TABLET | Freq: Every day | ORAL | Status: DC

## 2013-10-14 MED ORDER — PREDNISONE 20 MG PO TABS: ORAL_TABLET | ORAL | Status: DC

## 2013-10-14 NOTE — Progress Notes (Signed)
Patient ID: Corey Thomas MRN: [id number], DOB: [birth year], 48 y.o. Date of Encounter: @DATE @  Chief Complaint:  Chief Complaint  Patient presents with  . radiating pain down left leg    x 1 month    HPI: 48 y.o. year old AA male  presents with his wife for office visit.  Says that he has been having pain from his left low back down his left thigh to the level of the knee. Says that he tosses and turns all night and is not getting any sleep because of this. 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 Says that he sometimes may do some weed eating or using the lawnmower but that's about it. Says he never had any acute onset pain. Never had any known injury.  Seen at the ER 07/08/13 complaining of hip pain. He did have x-rays which showed some degenerative changes bilaterally but no other abnormality.  Also went to the ER 10/03/13 complaining of pain in his left upper leg for the past 2-3 weeks. Described as a dull achy pain which seemed to go from the hip area down to his knee. he also told him that this was similar to the episode that he experienced 2 months prior when he was seen in the ER and given diclofenac and cyclobenzaprine. The ER note from 10/03/13 says they felt it was musculoskeletal pain of the left hight. Specifically seemed to be having difficulty with sartorius muscle.They reviewed the hip x-rays that he had had done in the ER 2 months prior. They discharged him with prescriptions for Naprosyn and cyclobenzaprine and oxycodone and referred back to PCP for followup. They advised ice to the sore areas before and after activity and felt that he may benefit from course of physical therapy.   Past Medical History  Diagnosis Date  . Hypertension      Home Meds: Outpatient Prescriptions Prior to Visit  Medication Sig Dispense Refill  .  cyclobenzaprine (FLEXERIL) 10 MG tablet Take 1 tablet (10 mg total) by mouth 2 (two) times daily as needed for muscle spasms.  20 tablet  0  . diclofenac (VOLTAREN) 75 MG EC tablet Take 1 tablet (75 mg total) by mouth 2 (two) times daily. Take with food  14 tablet  0  . naproxen (NAPROSYN) 500 MG tablet Take 1 tablet (500 mg total) by mouth 2 (two) times daily.  30 tablet  0  . cetirizine (ZYRTEC) 10 MG tablet Take 1 tablet (10 mg total) by mouth daily.  14 tablet  0  . cyclobenzaprine (FLEXERIL) 10 MG tablet Take 1 tablet (10 mg total) by mouth 3 (three) times daily as needed.  21 tablet  0  . oxyCODONE-acetaminophen (PERCOCET/ROXICET) 5-325 MG per tablet Take 1 tablet by mouth every 4 (four) hours as needed.  6 tablet  0  . oxyCODONE-acetaminophen (PERCOCET/ROXICET) 5-325 MG per tablet Take 1 tablet by mouth every 4 (four) hours as needed for severe pain.  8 tablet  0  . oxyCODONE-acetaminophen (PERCOCET/ROXICET) 5-325 MG per tablet Take 1 tablet by mouth every 4 (four) hours as needed.  15 tablet  0  . triamcinolone ointment (KENALOG) 0.5 % Apply 1 application topically 2 (two) times daily.  30 g  0  . UNKNOWN TO PATIENT Take 1 tablet by mouth daily. BP MEDICATION 50mg   No facility-administered medications prior to visit.    Allergies: No Known Allergies  History   Social History  . Marital Status: Married    Spouse Name: N/A    Number of Children: N/A  . Years of Education: N/A   Occupational History  . Not on file.   Social History Main Topics  . Smoking status: Never Smoker   . Smokeless tobacco: Current User    Types: Snuff  . Alcohol Use: 1.8 oz/week    3 Cans of beer per week  . Drug Use: No  . Sexual Activity: Yes    Birth Control/ Protection: None   Other Topics Concern  . Not on file   Social History Narrative  . No narrative on file    No family history on file.   Review of Systems:  See HPI for pertinent ROS. All other ROS negative.    Physical  Exam: Blood pressure 148/102, pulse 88, temperature 98.1 F (36.7 C), temperature source Oral, resp. rate 18, weight 194 lb (87.998 kg)., Body mass index is 26.31 kg/(m^2). General: WNWD AAM. Appears in no acute distress. Neck: Supple. No thyromegaly. No lymphadenopathy. Lungs: Clear bilaterally to auscultation without wheezes, rales, or rhonchi. Breathing is unlabored. Heart: RRR with S1 S2. No murmurs, rubs, or gallops. Musculoskeletal:  Strength and tone normal for age. BACK: There is tenderness with palpation at the left low back at the L5 level. Some tenderness with palpation of the left sciatic notch. Right straight leg raise and right hip abduction--cause some discomfort in the left thigh. Straight leg raise is normal. Left hip abduction causes some discomfort in the medial left thigh. Extremities/Skin: Warm and dry.  Neuro: Alert and oriented X 3. Moves all extremities spontaneously. Gait is normal. CNII-XII grossly in tact. Psych:  Responds to questions appropriately with a normal affect.     ASSESSMENT AND PLAN:  48 y.o. year old male with  1. Left-sided low back pain with left-sided sciatica Discussed physical therapy. She and his wife both say that this will be difficult for him to get done with his work schedule. Defer doing physical therapy right now. Will treat with a course of prednisone and see if this gets rid of inflammation around the nerve.  - predniSONE (DELTASONE) 20 MG tablet; Take 3 daily for 2 days, then 2 daily for 2 days, then 1 daily for 2 days.  Dispense: 12 tablet; Refill: 0  2. Essential hypertension, benign  As the patient that his blood pressure is high today and was also high at the ER visit recently. Says that in the past he was given samples of Benicar but did not continue taking it. His wife says that he does not like swallowing large pills. Patient says that he doesn't like having to take medicines at all. Discussed with him that if he continues to  walk around with high blood pressure it is going to cause long-term problems. Will send prescription for losartan. - losartan (COZAAR) 50 MG tablet; Take 1 tablet (50 mg total) by mouth daily.  Dispense: 30 tablet; Refill: 3  He is to schedule followup office visit with me in 2 weeks to follow up with the above issues. Also need to followup lab work on the losartan.  [address] [place], [place], [facility] 10/14/2013 5:11 PM

## 2013-10-15 ENCOUNTER — Telehealth: Payer: Self-pay | Admitting: *Deleted

## 2013-10-15 DIAGNOSIS — I10 Essential (primary) hypertension: Secondary | ICD-10-CM

## 2013-10-15 NOTE — Telephone Encounter (Signed)
Received e-mail reporting that Losartan is not covered by patient insurance. Requested PA for Losartan.   Advised that Losartan requires trial and therapeutic failure of ACE inhibitor before insurance will cover.   No hx of ACE trial noted in chart.   Please advise.

## 2013-10-16 MED ORDER — BENAZEPRIL HCL 10 MG PO TABS: 10.0000 mg | ORAL_TABLET | Freq: Every day | ORAL | Status: DC

## 2013-10-16 NOTE — Telephone Encounter (Signed)
Change this to benazepril 10 mg one by mouth daily #30 with 0 refills. Tell patient to have his office visit be about 2 weeks after he starts his medication. ( At his last office visit with me I told him to followup in 2 weeks. However 1 week will have already passed by the time he ever gets on a medicine!!!!)

## 2013-10-16 NOTE — Telephone Encounter (Signed)
Patient called and told about Losartan not being on his insurance formulary.  Provider wants to start Benazepril 10 mg.  Rx to [place].  Pt has follow up appt already for next month.

## 2013-11-04 ENCOUNTER — Ambulatory Visit: Payer: Medicaid Other | Admitting: Physician Assistant

## 2013-11-06 ENCOUNTER — Ambulatory Visit (INDEPENDENT_AMBULATORY_CARE_PROVIDER_SITE_OTHER): Payer: Medicaid Other | Admitting: Physician Assistant

## 2013-11-06 ENCOUNTER — Encounter: Payer: Self-pay | Admitting: Physician Assistant

## 2013-11-06 VITALS — BP 124/88 | HR 84 | Temp 98.7°F | Resp 18 | Ht 72.0 in | Wt 198.0 lb

## 2013-11-06 DIAGNOSIS — I10 Essential (primary) hypertension: Secondary | ICD-10-CM

## 2013-11-06 DIAGNOSIS — M5430 Sciatica, unspecified side: Secondary | ICD-10-CM

## 2013-11-06 DIAGNOSIS — M5442 Lumbago with sciatica, left side: Secondary | ICD-10-CM

## 2013-11-06 NOTE — Progress Notes (Signed)
Patient ID: ZAC TORTI MRN: [id number], DOB: 1966-01-27, 48 y.o. Date of Encounter: @DATE @  Chief Complaint:  Chief Complaint  Patient presents with  . Follow-up    BP and leg pain - pred helped but after finishing the pain returned    HPI: 48 y.o. year old AA male  presents with his wife for office visit.   The following is what he reported at his [facility] with me 10/14/2013: Says that he has been having pain from his left low back down his left thigh to the level of the knee. Says that he tosses and turns all night and is not getting any sleep because of this. 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 Says that he sometimes may do some weed eating or using the lawnmower but that's about it. Says he never had any acute onset pain. Never had any known injury.  Seen at the ER 07/08/13 complaining of hip pain. He did have x-rays which showed some degenerative changes bilaterally but no other abnormality.  Also went to the ER 10/03/13 complaining of pain in his left upper leg for the past 2-3 weeks. Described as a dull achy pain which seemed to go from the hip area down to his knee. he also told him that this was similar to the episode that he experienced 2 months prior when he was seen in the ER and given diclofenac and cyclobenzaprine. The ER note from 10/03/13 says they felt it was musculoskeletal pain of the left hight. Specifically seemed to be having difficulty with sartorius muscle.They reviewed the hip x-rays that he had had done in the ER 2 months prior. They discharged him with prescriptions for Naprosyn and cyclobenzaprine and oxycodone and referred back to PCP for followup. They advised ice to the sore areas before and after activity and felt that he may benefit from course of physical therapy.  THE FOLLOWING IS THE A/P FROM THE [facility]  10/14/13: ASSESSMENT AND PLAN:  48 y.o. year old male with  1. Left-sided low back pain with left-sided sciatica Discussed physical therapy. She and his wife both say that this will be difficult for him to get done with his work schedule. Defer doing physical therapy right now. Will treat with a course of prednisone and see if this gets rid of inflammation around the nerve.  - predniSONE (DELTASONE) 20 MG tablet; Take 3 daily for 2 days, then 2 daily for 2 days, then 1 daily for 2 days.  Dispense: 12 tablet; Refill: 0  2. Essential hypertension, benign  As the patient that his blood pressure is high today and was also high at the ER visit recently. Says that in the past he was given samples of Benicar but did not continue taking it. His wife says that he does not like swallowing large pills. Patient says that he doesn't like having to take medicines at all. Discussed with him that if he continues to walk around with high blood pressure it is going to cause long-term problems. Will send prescription for losartan. - losartan (COZAAR) 50 MG tablet; Take 1 tablet (50 mg total) by mouth daily.  Dispense: 30 tablet; Refill: 3  He is to schedule followup office visit with me in 2 weeks to follow up with the above issues. Also need to followup lab work on  the losartan.     TODAY---11/06/2013---HE REPORTS:  Regarding the blood pressure, the losartan was not covered by his insurance -- this was changed to benazepril 10 mg. He is taking benazepril 10 mg daily. No adverse effects.  Regarding the pain down the left leg--- he and his wife state that after taking prednisone for 2 days he was feeling great. He continued to feel good during the course of the prednisone. However about 2 days after he had completed the prednisone the symptoms started to return. Now currently in the morning he has problems lifting his left leg to get his sock on. Also during the night he has discomfort and has not been getting  adequate sleep secondary to this. Currently he is taking a Flexeril and Naprosyn every morning but not taking any further medications later in the day. Has never had x-rays of his low back or other imaging studies for this.    Past Medical History  Diagnosis Date  . Hypertension      Home Meds: Outpatient Prescriptions Prior to Visit  Medication Sig Dispense Refill  . benazepril (LOTENSIN) 10 MG tablet Take 1 tablet (10 mg total) by mouth daily.  30 tablet  0  . cetirizine (ZYRTEC) 10 MG tablet Take 10 mg by mouth daily as needed.      . cyclobenzaprine (FLEXERIL) 10 MG tablet Take 1 tablet (10 mg total) by mouth 2 (two) times daily as needed for muscle spasms.  20 tablet  0  . diclofenac (VOLTAREN) 75 MG EC tablet Take 1 tablet (75 mg total) by mouth 2 (two) times daily. Take with food  14 tablet  0  . naproxen (NAPROSYN) 500 MG tablet Take 1 tablet (500 mg total) by mouth 2 (two) times daily.  30 tablet  0  . olmesartan (BENICAR) 40 MG tablet Take 40 mg by mouth daily.      Marland Kitchen oxyCODONE-acetaminophen (PERCOCET/ROXICET) 5-325 MG per tablet Take 1 tablet by mouth every 4 (four) hours as needed.  6 tablet  0  . predniSONE (DELTASONE) 20 MG tablet Take 3 daily for 2 days, then 2 daily for 2 days, then 1 daily for 2 days.  12 tablet  0   No facility-administered medications prior to visit.    Allergies: No Known Allergies  History   Social History  . Marital Status: Married    Spouse Name: N/A    Number of Children: N/A  . Years of Education: N/A   Occupational History  . Not on file.   Social History Main Topics  . Smoking status: Never Smoker   . Smokeless tobacco: Current User    Types: Snuff  . Alcohol Use: 1.8 oz/week    3 Cans of beer per week  . Drug Use: No  . Sexual Activity: Yes    Birth Control/ Protection: None   Other Topics Concern  . Not on file   Social History Narrative  . No narrative on file    No family history on file.   Review of Systems:  See  HPI for pertinent ROS. All other ROS negative.    Physical Exam: Blood pressure 124/88, pulse 84, temperature 98.7 F (37.1 C), temperature source Oral, resp. rate 18, height 6' (1.829 m), weight 198 lb (89.812 kg)., Body mass index is 26.85 kg/(m^2). General: WNWD AAM. Appears in no acute distress. Neck: Supple. No thyromegaly. No lymphadenopathy. Lungs: Clear bilaterally to auscultation without wheezes, rales, or rhonchi. Breathing is unlabored. Heart: RRR with  S1 S2. No murmurs, rubs, or gallops. Musculoskeletal:  Strength and tone normal for age. BACK: There is tenderness with palpation at the left low back at the L5 level. Some tenderness with palpation of the left sciatic notch. Right straight leg raise and right hip abduction--cause some discomfort in the left thigh. Straight leg raise is normal. Left hip abduction causes some discomfort in the medial left thigh. Extremities/Skin: Warm and dry.  Neuro: Alert and oriented X 3. Moves all extremities spontaneously. Gait is normal. CNII-XII grossly in tact. Psych:  Responds to questions appropriately with a normal affect.     ASSESSMENT AND PLAN:  48 y.o. year old male with   1. Essential hypertension, benign Blood pressure is at goal. Continue current medication. Check labs monitor. - BASIC METABOLIC PANEL WITH GFR  2. Left-sided low back pain with left-sided sciatica Will obtain lumbar spine x-ray. In the interim I told him that he can take the Naprosyn and Flexeril at nighttime as well as the morning dose he is currently taking. I will follow up with him once I obtain the x-ray results to determine further evaluation / treatment. - DG Lumbar Spine Complete; Future    Signed, Olean Ree [place], [place], [facility] 11/06/2013 4:19 PM

## 2013-11-07 ENCOUNTER — Encounter: Payer: Self-pay | Admitting: *Deleted

## 2013-11-07 LAB — BASIC METABOLIC PANEL WITH GFR
BUN: 13 mg/dL (ref 6–23)
CALCIUM: 9.2 mg/dL (ref 8.4–10.5)
CHLORIDE: 108 meq/L (ref 96–112)
CO2: 26 meq/L (ref 19–32)
Creat: 1.21 mg/dL (ref 0.50–1.35)
GFR, Est African American: 82 mL/min
GFR, Est Non African American: 71 mL/min
GLUCOSE: 105 mg/dL — AB (ref 70–99)
POTASSIUM: 4 meq/L (ref 3.5–5.3)
SODIUM: 141 meq/L (ref 135–145)

## 2013-11-15 ENCOUNTER — Ambulatory Visit
Admission: RE | Admit: 2013-11-15 | Discharge: 2013-11-15 | Disposition: A | Discharge status: Home / Self Care | Payer: Medicaid Other | Source: Ambulatory Visit | Attending: Physician Assistant | Admitting: Physician Assistant

## 2013-11-15 DIAGNOSIS — M5442 Lumbago with sciatica, left side: Secondary | ICD-10-CM

## 2013-11-21 ENCOUNTER — Telehealth: Payer: Self-pay | Admitting: Family Medicine

## 2013-11-21 ENCOUNTER — Ambulatory Visit (INDEPENDENT_AMBULATORY_CARE_PROVIDER_SITE_OTHER): Payer: Medicaid Other | Admitting: Physician Assistant

## 2013-11-21 ENCOUNTER — Encounter: Payer: Self-pay | Admitting: Physician Assistant

## 2013-11-21 ENCOUNTER — Ambulatory Visit: Payer: Medicaid Other | Admitting: Physician Assistant

## 2013-11-21 VITALS — BP 130/76 | HR 72 | Temp 98.5°F | Resp 14 | Ht 72.0 in | Wt 187.0 lb

## 2013-11-21 DIAGNOSIS — I10 Essential (primary) hypertension: Secondary | ICD-10-CM

## 2013-11-21 DIAGNOSIS — R252 Cramp and spasm: Secondary | ICD-10-CM

## 2013-11-21 LAB — COMPLETE METABOLIC PANEL WITH GFR
ALK PHOS: 65 U/L (ref 39–117)
ALT: 22 U/L (ref 0–53)
AST: 30 U/L (ref 0–37)
Albumin: 4.8 g/dL (ref 3.5–5.2)
BILIRUBIN TOTAL: 0.6 mg/dL (ref 0.2–1.2)
BUN: 22 mg/dL (ref 6–23)
CO2: 26 mEq/L (ref 19–32)
Calcium: 9.4 mg/dL (ref 8.4–10.5)
Chloride: 96 mEq/L (ref 96–112)
Creat: 1.39 mg/dL — ABNORMAL HIGH (ref 0.50–1.35)
GFR, Est African American: 69 mL/min
GFR, Est Non African American: 60 mL/min
Glucose, Bld: 102 mg/dL — ABNORMAL HIGH (ref 70–99)
POTASSIUM: 3.7 meq/L (ref 3.5–5.3)
SODIUM: 135 meq/L (ref 135–145)
Total Protein: 8 g/dL (ref 6.0–8.3)

## 2013-11-21 LAB — CK: CK TOTAL: 686 U/L — AB (ref 7–232)

## 2013-11-21 LAB — CBC WITH DIFFERENTIAL/PLATELET
Basophils Absolute: 0.1 10*3/uL (ref 0.0–0.1)
Basophils Relative: 1 % (ref 0–1)
EOS ABS: 0.2 10*3/uL (ref 0.0–0.7)
Eosinophils Relative: 3 % (ref 0–5)
HCT: 44.7 % (ref 39.0–52.0)
Hemoglobin: 15.7 g/dL (ref 13.0–17.0)
LYMPHS ABS: 2.2 10*3/uL (ref 0.7–4.0)
Lymphocytes Relative: 42 % (ref 12–46)
MCH: 31.4 pg (ref 26.0–34.0)
MCHC: 35.1 g/dL (ref 30.0–36.0)
MCV: 89.4 fL (ref 78.0–100.0)
Monocytes Absolute: 0.6 10*3/uL (ref 0.1–1.0)
Monocytes Relative: 11 % (ref 3–12)
NEUTROS ABS: 2.2 10*3/uL (ref 1.7–7.7)
NEUTROS PCT: 43 % (ref 43–77)
Platelets: 267 10*3/uL (ref 150–400)
RBC: 5 MIL/uL (ref 4.22–5.81)
RDW: 14.6 % (ref 11.5–15.5)
WBC: 5.2 10*3/uL (ref 4.0–10.5)

## 2013-11-21 LAB — TSH: TSH: 2.102 u[IU]/mL (ref 0.350–4.500)

## 2013-11-21 MED ORDER — CYCLOBENZAPRINE HCL 10 MG PO TABS: 10.0000 mg | ORAL_TABLET | Freq: Two times a day (BID) | ORAL | Status: DC | PRN

## 2013-11-21 NOTE — Telephone Encounter (Signed)
Ok to refill 

## 2013-11-21 NOTE — Progress Notes (Signed)
Patient ID: Corey Thomas MRN: [id number], DOB: April 22, 1965, 48 y.o. Date of Encounter: @DATE @  Chief Complaint:  Chief Complaint  Patient presents with  . Muscle Cramps    x1 year- states that he has generalized muscle cramps- was given muscle relaxer, but it is not effective any more- states that he has leg cramps during the night    HPI: 48 y.o. year old AA male  presents with above symptoms.  He says that he has been having cramps of diffuse body parts. Says that this is getting worse. Says he's been having cramps in his feet hands and even in his side and yesterday it even occurred in his abdomen. He says that sometimes at work his hands have clamped and drawn up to the point that he has to take a break from his work and so they resolve. 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 Says that at night he is woken with cramps and sometimes has to get up and walk around in the middle of the night to try to get them to ease off and resolve. Says he is exhausted because of lack of sleep secondary to these cramps.  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 He says that he drinks water and Gatorade throughout the day to prevent dehydration.  He says that the entire work crew does stretches together first thing in the morning prior to starting work. He says that he does no other exercise. Does not go to a gym. Does not do any other cardio exercise and does not lift any weights.   Past Medical History  Diagnosis Date  . Hypertension      Home Meds: Outpatient Prescriptions Prior to Visit  Medication Sig Dispense Refill  . benazepril (LOTENSIN) 10 MG tablet Take 1 tablet (10 mg total) by mouth daily.  30 tablet  0  . cyclobenzaprine (FLEXERIL) 10 MG tablet Take 1 tablet (10 mg total) by mouth 2 (two) times daily as needed for muscle spasms.  20 tablet  0  .  naproxen (NAPROSYN) 500 MG tablet Take 1 tablet (500 mg total) by mouth 2 (two) times daily.  30 tablet  0  . cetirizine (ZYRTEC) 10 MG tablet Take 10 mg by mouth daily as needed.      . diclofenac (VOLTAREN) 75 MG EC tablet Take 1 tablet (75 mg total) by mouth 2 (two) times daily. Take with food  14 tablet  0   No facility-administered medications prior to visit.    Allergies: No Known Allergies  History   Social History  . Marital Status: Married    Spouse Name: N/A    Number of Children: N/A  . Years of Education: N/A   Occupational History  . Not on file.   Social History Main Topics  . Smoking status: Never Smoker   . Smokeless tobacco: Current User    Types: Snuff  . Alcohol Use: 1.8 oz/week    3 Cans of beer per week  . Drug Use: No  . Sexual Activity: Yes    Birth Control/ Protection: None   Other Topics Concern  . Not on file   Social History Narrative  . No narrative on file    No family history on file.   Review of Systems:  See HPI for pertinent ROS. All other ROS negative.  Physical Exam: Blood pressure 130/76, pulse 72, temperature 98.5 F (36.9 C), temperature source Oral, resp. rate 14, height 6' (1.829 m), weight 187 lb (84.823 kg)., Body mass index is 25.36 kg/(m^2). General: WNWD AAM. Appears in no acute distress. Neck: Supple. No thyromegaly. No lymphadenopathy. Lungs: Clear bilaterally to auscultation without wheezes, rales, or rhonchi. Breathing is unlabored. Heart: RRR with S1 S2. No murmurs, rubs, or gallops. Abdomen: Soft, non-tender, non-distended with normoactive bowel sounds. No hepatomegaly. No rebound/guarding. No obvious abdominal masses. Musculoskeletal:  Strength and tone normal for age. Extremities/Skin: Warm and dry.  No rashes or suspicious lesions. Neuro: Alert and oriented X 3. Moves all extremities spontaneously. Gait is normal. CNII-XII grossly in tact. Psych:  Responds to questions appropriately with a normal affect.      ASSESSMENT AND PLAN:  48 y.o. year old male with    1. Muscle cramps - CK - Sedimentation rate - TSH - CBC with Differential - COMPLETE METABOLIC PANEL WITH GFR - Ambulatory referral to Neurology  Will check labs. If this provides no answers, then will have him followup with neurology.  Signed, [address] [place], [place], [facility] 11/21/2013 3:00 PM

## 2013-11-21 NOTE — Telephone Encounter (Signed)
Prescription sent to pharmacy.

## 2013-11-21 NOTE — Telephone Encounter (Signed)
Patient forgot to ask for refill on his flexoril

## 2013-11-21 NOTE — Telephone Encounter (Signed)
Approved. #60+2 additional refills. 

## 2013-11-22 LAB — SEDIMENTATION RATE: SED RATE: 1 mm/h (ref 0–16)

## 2013-11-27 ENCOUNTER — Other Ambulatory Visit: Payer: Self-pay | Admitting: Physician Assistant

## 2013-11-27 ENCOUNTER — Telehealth: Payer: Self-pay | Admitting: Family Medicine

## 2013-11-27 DIAGNOSIS — I10 Essential (primary) hypertension: Secondary | ICD-10-CM

## 2013-11-27 MED ORDER — BENAZEPRIL HCL 10 MG PO TABS: 10.0000 mg | ORAL_TABLET | Freq: Every day | ORAL | Status: DC

## 2013-11-27 NOTE — Telephone Encounter (Signed)
[facility]  Patient is calling to get refill on his blood pressure medication said that they called the pharmacy over a week ago and has not heard anything back about this  [phone]

## 2013-11-27 NOTE — Telephone Encounter (Signed)
Med sent to pharm and tried to call pt but mail box was full

## 2013-12-10 ENCOUNTER — Encounter (INDEPENDENT_AMBULATORY_CARE_PROVIDER_SITE_OTHER): Payer: Self-pay

## 2013-12-10 ENCOUNTER — Ambulatory Visit (INDEPENDENT_AMBULATORY_CARE_PROVIDER_SITE_OTHER): Payer: Medicaid Other | Admitting: Neurology

## 2013-12-10 ENCOUNTER — Encounter: Payer: Self-pay | Admitting: Neurology

## 2013-12-10 VITALS — BP 170/110 | HR 68 | Ht 72.0 in | Wt 198.0 lb

## 2013-12-10 DIAGNOSIS — M79609 Pain in unspecified limb: Secondary | ICD-10-CM

## 2013-12-10 DIAGNOSIS — M79652 Pain in left thigh: Secondary | ICD-10-CM

## 2013-12-10 MED ORDER — TRAMADOL HCL 50 MG PO TABS: 50.0000 mg | ORAL_TABLET | Freq: Four times a day (QID) | ORAL | Status: DC | PRN

## 2013-12-10 NOTE — Patient Instructions (Signed)

## 2013-12-10 NOTE — Progress Notes (Signed)
Reason for visit: Left leg pain  Corey Thomas is a 48 y.o. male  History of present illness:  Corey Thomas is a 48 year old right-handed black male with a history of intermittent left leg discomfort that began about 6 months ago. The patient began having more constant pain about 3 months ago, and the pain is described as a burning sensation in the upper thigh going into the middle and lower thigh anteriorly, occasionally with discomfort posteriorly as well. He reports that the pain is always present, and is not associated with true muscle cramping. He is unable sleep well because of the discomfort. The denies any significant discomfort down below the knees, and he reports no numbness of the extremity with exception that there is some tingling in the upper thigh. The patient denies any back pain or pain into the right leg, and he denies any neck or shoulder or arm discomfort. He denies any change in bowel or bladder control or problems with balance. He has had x-rays done of the lumbosacral spine and the x-rays show minimal degenerative arthritis. He is sent to this office for an evaluation. He is taking Flexeril with some benefit, but he is still not able to rest well.  Past Medical History  Diagnosis Date  . Hypertension     History reviewed. No pertinent past surgical history.  Family History  Problem Relation Age of Onset  . Healthy Sister   . Healthy Brother   . Healthy Sister     Social history:  reports that he has never smoked. His smokeless tobacco use includes Snuff. He reports that he drinks about 1.8 ounces of alcohol per week. He reports that he does not use illicit drugs.  Medications:  Current Outpatient Prescriptions on File Prior to Visit  Medication Sig Dispense Refill  . benazepril (LOTENSIN) 10 MG tablet Take 1 tablet (10 mg total) by mouth daily.  30 tablet  0  . cyclobenzaprine (FLEXERIL) 10 MG tablet Take 1 tablet (10 mg total) by mouth 2 (two) times daily  as needed for muscle spasms.  60 tablet  2  . naproxen (NAPROSYN) 500 MG tablet Take 1 tablet (500 mg total) by mouth 2 (two) times daily.  30 tablet  0   No current facility-administered medications on file prior to visit.     No Known Allergies  ROS:  Out of a complete 14 system review of symptoms, the patient complains only of the following symptoms, and all other reviewed systems are negative.  Joint pain, muscle cramps, achy muscles Restless legs  Blood pressure 170/110, pulse 68, height 6' (1.829 m), weight 198 lb (89.812 kg).  Physical Exam  General: The patient is alert and cooperative at the time of the examination.  Eyes: Pupils are equal, round, and reactive to light. Discs are flat bilaterally.  Neck: The neck is supple, no carotid bruits are noted.  Respiratory: The respiratory examination is clear.  Cardiovascular: The cardiovascular examination reveals a regular rate and rhythm, no obvious murmurs or rubs are noted.  Neuromuscular: Range movement of the low back is full.  Skin: Extremities are without significant edema.  Neurologic Exam  Mental status: The patient is alert and oriented x 3 at the time of the examination. The patient has apparent normal recent and remote memory, with an apparently normal attention span and concentration ability.  Cranial nerves: Facial symmetry is present. There is good sensation of the face to pinprick and soft touch bilaterally. The strength  of the facial muscles and the muscles to head turning and shoulder shrug are normal bilaterally. Speech is well enunciated, no aphasia or dysarthria is noted. Extraocular movements are full. Visual fields are full. The tongue is midline, and the patient has symmetric elevation of the soft palate. No obvious hearing deficits are noted.  Motor: The motor testing reveals 5 over 5 strength of all 4 extremities. Good symmetric motor tone is noted throughout. The patient is able to walk on heels  and the toes.  Sensory: Sensory testing is intact to pinprick, soft touch, vibration sensation, and position sense on all 4 extremities. No evidence of extinction is noted.  Coordination: Cerebellar testing reveals good finger-nose-finger and heel-to-shin bilaterally.  Gait and station: Gait is normal. Tandem gait is normal. Romberg is negative. No drift is seen.  Reflexes: Deep tendon reflexes are symmetric and normal bilaterally. Toes are downgoing bilaterally.   Assessment/Plan:  1. Left lower extremity discomfort   The patient reports pain in the left thigh primarily. The examination does not show any true weakness or reflex asymmetry. The patient has had some elevations in muscle enzyme levels. The patient will be set up for nerve conduction studies of both legs, EMG of the left leg. Depending upon the results of this study, the patient may go on to have MRI evaluation of the low back or pelvis. The patient has had some muscle enzyme level elevations in the 600 range. This may need to be rechecked.  Jill Alexanders MD 12/10/2013 7:21 PM  [facility] [address] [place], [place] [address]  Phone [phone] Fax [phone]

## 2013-12-11 ENCOUNTER — Telehealth: Payer: Self-pay | Admitting: Neurology

## 2013-12-11 MED ORDER — HYDROCODONE-ACETAMINOPHEN 5-325 MG PO TABS: 1.0000 | ORAL_TABLET | Freq: Four times a day (QID) | ORAL | Status: DC | PRN

## 2013-12-11 NOTE — Telephone Encounter (Signed)
I called patient. The Ultram is not helping the pain, I will give him hydrocodone until we can determine the cause of his underlying left leg pain.

## 2013-12-11 NOTE — Telephone Encounter (Signed)
Please advise previous note. Thanks  °

## 2013-12-11 NOTE — Telephone Encounter (Signed)
Patient's wife [name], called and stated traMADol (ULTRAM) 50 MG tablet isn't helping with pain.  Please return call anytime and may leave detailed message on voicemail.

## 2013-12-12 NOTE — Telephone Encounter (Signed)
Pt picked up his Rx today.

## 2013-12-18 ENCOUNTER — Ambulatory Visit (INDEPENDENT_AMBULATORY_CARE_PROVIDER_SITE_OTHER): Payer: Medicaid Other | Admitting: Neurology

## 2013-12-18 ENCOUNTER — Encounter (INDEPENDENT_AMBULATORY_CARE_PROVIDER_SITE_OTHER): Payer: Self-pay

## 2013-12-18 DIAGNOSIS — Z0289 Encounter for other administrative examinations: Secondary | ICD-10-CM

## 2013-12-18 DIAGNOSIS — M79652 Pain in left thigh: Secondary | ICD-10-CM

## 2013-12-18 DIAGNOSIS — M79609 Pain in unspecified limb: Secondary | ICD-10-CM

## 2013-12-18 NOTE — Procedures (Signed)
     HISTORY:  Clenton Esper is a 48 year old gentleman with a history of onset of left groin, thigh, and knee discomfort, worse with lying down flat. The patient has had elevations in CK enzyme levels greater than 600. He reports cramps in the muscles of the left leg. He denies low back pain. He is being evaluated for possible neuropathy, radiculopathy, or myopathic disorder.  NERVE CONDUCTION STUDIES:  Nerve conduction studies were performed on both lower extremities. The distal motor latencies and motor amplitudes for the peroneal and posterior tibial nerves were within normal limits. The nerve conduction velocities for these nerves were also normal. The H reflex latencies were normal. The sensory latencies for the peroneal nerves were within normal limits.   EMG STUDIES:  EMG study was performed on the left lower extremity:  The tibialis anterior muscle reveals 2 to 4K motor units with full recruitment. No fibrillations or positive waves were seen. The peroneus tertius muscle reveals 2 to 4K motor units with full recruitment. No fibrillations or positive waves were seen. The medial gastrocnemius muscle reveals 1 to 3K motor units with full recruitment. No fibrillations or positive waves were seen. The vastus lateralis muscle reveals 2 to 4K motor units with full recruitment. No fibrillations or positive waves were seen. The iliopsoas muscle reveals 2 to 4K motor units with full recruitment. No fibrillations or positive waves were seen. The biceps femoris muscle (long head) reveals 2 to 4K motor units with full recruitment. No fibrillations or positive waves were seen. The lumbosacral paraspinal muscles were tested at 3 levels, and revealed no abnormalities of insertional activity at all 3 levels tested. There was good relaxation.   IMPRESSION:  Nerve conduction studies done on both lower extremities were within normal limits. No evidence of a peripheral neuropathy is seen. EMG  evaluation of the left lower extremity is unremarkable, without evidence of a lumbosacral radiculopathy or evidence of a myopathic disorder.  Jill Alexanders MD 12/18/2013 4:21 PM  [facility] [address] [place], [place] [address]  Phone [phone] Fax [phone]

## 2013-12-18 NOTE — Progress Notes (Signed)
Corey Thomas is a 48 year old patient with a history of left leg discomfort affecting the left groin area, left thigh, and around the left knee. The pain is worse with lying down flat. He has had elevations in CK enzyme levels greater than 600. He returns for EMG and nerve conduction study.  Nerve conductions of the lower extremities were unremarkable. EMG of the left leg was unremarkable.  The patient will be sent for further blood work, and he will be set up for MRI evaluation of the left hip. The patient could potentially have an iliotibial band syndrome of pain, or a left hip issue. The patient will followup in about 6-8 weeks.

## 2013-12-23 ENCOUNTER — Telehealth: Payer: Self-pay | Admitting: Neurology

## 2013-12-23 NOTE — Telephone Encounter (Signed)
Patient's wife Corey Thomas calling on behalf of patient to state that patient dropped his bottle of hydrocodone pills in the water and now needs a refill, please return call and advise.

## 2013-12-23 NOTE — Telephone Encounter (Signed)
Hydrocodone was prescribed on 09/23 for #60 by Dr Jannifer Franklin.  Patient's wife says patient dropped his pills in the water and needs a new Rx.  I called the pharmacy.  Spoke with Ovid Curd.  He reviewed the patient chart and says the patient has not been getting meds from anyone else, and last fill was 09/23.  Dr Jannifer Franklin is out of the office.  Forwarding request to [facility] for review.  Please advise.  Thank you.

## 2013-12-24 MED ORDER — HYDROCODONE-ACETAMINOPHEN 5-325 MG PO TABS: 1.0000 | ORAL_TABLET | Freq: Four times a day (QID) | ORAL | Status: DC | PRN

## 2013-12-24 NOTE — Telephone Encounter (Signed)
Patient's wife says they dropped the Hydrocodone tabs in the water and want to get a new written Rx.  #60 last prescribed 09/23.  Please advise.  Thank you.

## 2013-12-24 NOTE — Telephone Encounter (Signed)
Patient's wife checking on status of hydrocodone script, please return call and advise.

## 2013-12-24 NOTE — Telephone Encounter (Signed)
I called patient. The patient apparently dropped his hydrocodone tablets into dishwater, destroying the tablets. I will write a prescription for 30 tablets, no more.

## 2013-12-25 NOTE — Telephone Encounter (Signed)
Called pt and left message informing him that his Rx was ready to be picked up at the front desk and if he has any other problems, questions or concerns to call the office. °

## 2014-01-06 ENCOUNTER — Telehealth: Payer: Self-pay | Admitting: Neurology

## 2014-01-06 ENCOUNTER — Ambulatory Visit
Admission: RE | Admit: 2014-01-06 | Discharge: 2014-01-06 | Disposition: A | Discharge status: Home / Self Care | Payer: Medicaid Other | Source: Ambulatory Visit | Attending: Neurology | Admitting: Neurology

## 2014-01-06 DIAGNOSIS — M25552 Pain in left hip: Principal | ICD-10-CM

## 2014-01-06 DIAGNOSIS — G8929 Other chronic pain: Secondary | ICD-10-CM

## 2014-01-06 DIAGNOSIS — M79652 Pain in left thigh: Secondary | ICD-10-CM

## 2014-01-06 NOTE — Telephone Encounter (Signed)
I called the patient. MRI the hips does show some degenerative changes, and some evidence of a small joint effusion on the left. The joint issue may be the source of this gentleman pain, and I will make a referral to orthopedic surgery.   MRI hips 01/06/2014:  IMPRESSION: 1. Degenerative changes of both hips, slightly worse on the left. There is an associated small complex left hip joint effusion. 2. 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 3. Probable ossification within the common hamstring tendons bilaterally, likely related to prior injury. The tendons are incompletely visualized by this examination but appear intact at their ischial origins.

## 2014-01-13 ENCOUNTER — Telehealth: Payer: Self-pay | Admitting: Neurology

## 2014-01-13 NOTE — Telephone Encounter (Signed)
Patient's spouse calling regarding referral sent to [facility].  Was informed referral was sent back to our office, due to PCP on [facility] card would have to refer patient.  Please call and advise.  May leave detailed message on voicemail if not available.

## 2014-01-13 NOTE — Telephone Encounter (Signed)
Spoke to patient's wife [name] and relayed that they need to contact his PCP because of his insurance.  The referral was made to Dr. Trenton Gammon.  She verbalized understanding.

## 2014-01-15 ENCOUNTER — Other Ambulatory Visit: Payer: Self-pay | Admitting: Neurology

## 2014-01-15 MED ORDER — HYDROCODONE-ACETAMINOPHEN 5-325 MG PO TABS: 1.0000 | ORAL_TABLET | Freq: Four times a day (QID) | ORAL | Status: DC | PRN

## 2014-01-15 NOTE — Telephone Encounter (Signed)
Forwarded to provider for review.

## 2014-01-15 NOTE — Telephone Encounter (Signed)
Patient requesting Rx refill for HYDROcodone-acetaminophen (NORCO/VICODIN) 5-325 MG per tablet.  Please call when ready for pick up. °

## 2014-01-16 NOTE — Telephone Encounter (Signed)
I called the patient to let them know their Rx for Hydrocodone was ready for pickup. Patient was instructed to bring Photo ID. 

## 2014-01-17 ENCOUNTER — Other Ambulatory Visit: Payer: Self-pay | Admitting: Physician Assistant

## 2014-01-20 ENCOUNTER — Other Ambulatory Visit: Payer: Self-pay | Admitting: Family Medicine

## 2014-01-20 ENCOUNTER — Telehealth: Payer: Self-pay | Admitting: Neurology

## 2014-01-20 DIAGNOSIS — M2540 Effusion, unspecified joint: Secondary | ICD-10-CM

## 2014-01-20 NOTE — Telephone Encounter (Signed)
I called the wife. The patient apparently has Medicaid, and the primary care physician must make the referral to orthopedic surgery. I will send a letter to the primary care doctor so that he is aware of the MRI results and the reason for the referral.

## 2014-01-20 NOTE — Telephone Encounter (Signed)
Patient's wife [name] has questions regarding patient's referral to [facility], please return call and advise.

## 2014-03-26 ENCOUNTER — Emergency Department (HOSPITAL_COMMUNITY): Payer: Medicaid Other

## 2014-03-26 ENCOUNTER — Encounter (HOSPITAL_COMMUNITY): Payer: Self-pay

## 2014-03-26 ENCOUNTER — Emergency Department (HOSPITAL_COMMUNITY)
Admission: EM | Admit: 2014-03-26 | Discharge: 2014-03-26 | Disposition: A | Discharge status: Home / Self Care | Payer: Medicaid Other | Attending: Emergency Medicine | Admitting: Emergency Medicine

## 2014-03-26 DIAGNOSIS — I10 Essential (primary) hypertension: Secondary | ICD-10-CM | POA: Insufficient documentation

## 2014-03-26 DIAGNOSIS — Z79899 Other long term (current) drug therapy: Secondary | ICD-10-CM | POA: Insufficient documentation

## 2014-03-26 DIAGNOSIS — R109 Unspecified abdominal pain: Secondary | ICD-10-CM | POA: Diagnosis present

## 2014-03-26 DIAGNOSIS — K5900 Constipation, unspecified: Secondary | ICD-10-CM | POA: Insufficient documentation

## 2014-03-26 DIAGNOSIS — E876 Hypokalemia: Secondary | ICD-10-CM | POA: Insufficient documentation

## 2014-03-26 DIAGNOSIS — Z791 Long term (current) use of non-steroidal anti-inflammatories (NSAID): Secondary | ICD-10-CM | POA: Insufficient documentation

## 2014-03-26 LAB — COMPREHENSIVE METABOLIC PANEL
ALBUMIN: 4 g/dL (ref 3.5–5.2)
ALT: 29 U/L (ref 0–53)
ANION GAP: 6 (ref 5–15)
AST: 25 U/L (ref 0–37)
Alkaline Phosphatase: 60 U/L (ref 39–117)
BILIRUBIN TOTAL: 0.3 mg/dL (ref 0.3–1.2)
BUN: 8 mg/dL (ref 6–23)
CHLORIDE: 105 meq/L (ref 96–112)
CO2: 26 mmol/L (ref 19–32)
Calcium: 8.6 mg/dL (ref 8.4–10.5)
Creatinine, Ser: 1.12 mg/dL (ref 0.50–1.35)
GFR calc non Af Amer: 77 mL/min — ABNORMAL LOW (ref >90)
GFR, EST AFRICAN AMERICAN: 89 mL/min — AB (ref >90)
Glucose, Bld: 126 mg/dL — ABNORMAL HIGH (ref 70–99)
POTASSIUM: 3.1 mmol/L — AB (ref 3.5–5.1)
Sodium: 137 mmol/L (ref 135–145)
Total Protein: 7 g/dL (ref 6.0–8.3)

## 2014-03-26 LAB — URINALYSIS, ROUTINE W REFLEX MICROSCOPIC
Bilirubin Urine: NEGATIVE
Glucose, UA: NEGATIVE mg/dL
Hgb urine dipstick: NEGATIVE
Ketones, ur: NEGATIVE mg/dL
Leukocytes, UA: NEGATIVE
Nitrite: NEGATIVE
Protein, ur: NEGATIVE mg/dL
Specific Gravity, Urine: 1.01 (ref 1.005–1.030)
Urobilinogen, UA: 0.2 mg/dL (ref 0.0–1.0)
pH: 6 (ref 5.0–8.0)

## 2014-03-26 LAB — CBC WITH DIFFERENTIAL/PLATELET
Basophils Absolute: 0 K/uL (ref 0.0–0.1)
Basophils Relative: 0 % (ref 0–1)
Eosinophils Absolute: 0.2 K/uL (ref 0.0–0.7)
Eosinophils Relative: 4 % (ref 0–5)
HCT: 40.1 % (ref 39.0–52.0)
Hemoglobin: 14.1 g/dL (ref 13.0–17.0)
Lymphocytes Relative: 34 % (ref 12–46)
Lymphs Abs: 1.9 K/uL (ref 0.7–4.0)
MCH: 31.4 pg (ref 26.0–34.0)
MCHC: 35.2 g/dL (ref 30.0–36.0)
MCV: 89.3 fL (ref 78.0–100.0)
Monocytes Absolute: 0.6 K/uL (ref 0.1–1.0)
Monocytes Relative: 11 % (ref 3–12)
Neutro Abs: 2.9 K/uL (ref 1.7–7.7)
Neutrophils Relative %: 51 % (ref 43–77)
Platelets: 168 K/uL (ref 150–400)
RBC: 4.49 MIL/uL (ref 4.22–5.81)
RDW: 13.3 % (ref 11.5–15.5)
WBC: 5.7 K/uL (ref 4.0–10.5)

## 2014-03-26 LAB — LIPASE, BLOOD: Lipase: 44 U/L (ref 11–59)

## 2014-03-26 MED ORDER — SODIUM CHLORIDE 0.9 % IV BOLUS (SEPSIS)
1000.0000 mL | Freq: Once | INTRAVENOUS | Status: AC
  Administered 2014-03-26: 1000 mL via INTRAVENOUS

## 2014-03-26 MED ORDER — POTASSIUM CHLORIDE CRYS ER 20 MEQ PO TBCR
40.0000 meq | EXTENDED_RELEASE_TABLET | Freq: Once | ORAL | Status: AC
  Administered 2014-03-26: 40 meq via ORAL
  Filled 2014-03-26: qty 2

## 2014-03-26 MED ORDER — POTASSIUM CHLORIDE CRYS ER 20 MEQ PO TBCR: 20.0000 meq | EXTENDED_RELEASE_TABLET | Freq: Two times a day (BID) | ORAL | Status: DC

## 2014-03-26 MED ORDER — DIPHENHYDRAMINE HCL 50 MG/ML IJ SOLN
25.0000 mg | Freq: Once | INTRAMUSCULAR | Status: AC
  Administered 2014-03-26: 25 mg via INTRAVENOUS
  Filled 2014-03-26: qty 1

## 2014-03-26 MED ORDER — METOCLOPRAMIDE HCL 5 MG/ML IJ SOLN
10.0000 mg | Freq: Once | INTRAMUSCULAR | Status: AC
  Administered 2014-03-26: 10 mg via INTRAVENOUS
  Filled 2014-03-26: qty 2

## 2014-03-26 NOTE — ED Provider Notes (Signed)
CSN: [id number]     Arrival date & time 03/26/14  0001 History  This chart was scribed for Janice Norrie, MD by Rayfield Citizen, ED Scribe. This patient was seen in room APA12/APA12 and the patient's care was started at 12:30 AM.    Chief Complaint  Patient presents with  . Abdominal Pain   The history is provided by the patient. No language interpreter was used.     HPI Comments: Corey Thomas is a 49 y.o. male with past medical history of HTN (managed with Lotensin) who presents to the Emergency Department complaining of 24 hours of constant generalized abdominal pain; he describes this pain as sharp, stabbing. His pain began yesterday and resolved itself this morning; he had no pain throughout the day today but his pain returned before bedtime tonight. He reports hard BMs; last BM yesterday and he had to strain to have the BM. Nothing makes symptoms worse; he believes a glass of milk this morning and applied pressure during exam improved his symptoms. He does not associate his pain with eating. He denies nausea, vomiting, diarrhea, difficulty urinating. He does feel like he has bloating.   Patient reports that he was placed on Robaxin and Voltaren by Dr. Alvan Dame on 12/18 due to an injury in his hips. He explains that he did not take these meds described today; he feels they are related to his current symptoms. Two days PTA, patient took 3 voltaren by accident in 1 day, rather than two.   He is not taking hydrocodone, naprosyn, flexeril, or motrin at this time.   PCP is Odette Fraction, MD.  Orthopedist Dr Alvan Dame  Past Medical History  Diagnosis Date  . Hypertension    History reviewed. No pertinent past surgical history. Family History  Problem Relation Age of Onset  . Healthy Sister   . Healthy Brother   . Healthy Sister    History  Substance Use Topics  . Smoking status: Never Smoker   . Smokeless tobacco: Current User    Types: Snuff  . Alcohol Use: 1.8 oz/week    3 Cans of beer  per week  He is a nonsmoker. He works in Biomedical scientist by trade.   Review of Systems  Gastrointestinal: Positive for abdominal pain.  All other systems reviewed and are negative.   Allergies  Review of patient's allergies indicates no known allergies.  Home Medications   Prior to Admission medications   Medication Sig Start Date End Date Taking? Authorizing Provider  diclofenac (VOLTAREN) 75 MG EC tablet Take 75 mg by mouth 2 (two) times daily.   Yes Historical Provider, MD  methocarbamol (ROBAXIN) 500 MG tablet Take 500 mg by mouth 4 (four) times daily.   Yes Historical Provider, MD  benazepril (LOTENSIN) 10 MG tablet TAKE ONE TABLET BY MOUTH DAILY. 01/17/14   Lonie Peak Dixon, PA-C  cyclobenzaprine (FLEXERIL) 10 MG tablet Take 1 tablet (10 mg total) by mouth 2 (two) times daily as needed for muscle spasms. 11/21/13   Orlena Sheldon, PA-C  HYDROcodone-acetaminophen (NORCO/VICODIN) 5-325 MG per tablet Take 1 tablet by mouth every 6 (six) hours as needed for moderate pain. Must last 28 days 01/15/14   Kathrynn Ducking, MD  naproxen (NAPROSYN) 500 MG tablet Take 1 tablet (500 mg total) by mouth 2 (two) times daily. 6/64/40   Delora Fuel, MD  potassium chloride SA (K-DUR,KLOR-CON) 20 MEQ tablet Take 1 tablet (20 mEq total) by mouth 2 (two) times daily. 03/26/14  Janice Norrie, MD   BP 170/114 mmHg  Pulse 64  Temp(Src) 97.8 F (36.6 C) (Oral)  Resp 20  Ht 6' (1.829 m)  Wt 199 lb (90.266 kg)  BMI 26.98 kg/m2  SpO2 100%  Vital signs normal except for hypertension  Physical Exam  Constitutional: He is oriented to person, place, and time. He appears well-developed and well-nourished.  Non-toxic appearance. He does not appear ill. No distress.  HENT:  Head: Normocephalic and atraumatic.  Right Ear: External ear normal.  Left Ear: External ear normal.  Nose: Nose normal. No mucosal edema or rhinorrhea.  Mouth/Throat: Oropharynx is clear and moist and mucous membranes are normal. No dental  abscesses or uvula swelling.  Eyes: Conjunctivae and EOM are normal. Pupils are equal, round, and reactive to light.  Neck: Normal range of motion and full passive range of motion without pain. Neck supple.  Cardiovascular: Normal rate, regular rhythm and normal heart sounds.  Exam reveals no gallop and no friction rub.   No murmur heard. Pulmonary/Chest: Effort normal and breath sounds normal. No respiratory distress. He has no wheezes. He has no rhonchi. He has no rales. He exhibits no tenderness and no crepitus.  Abdominal: Soft. Normal appearance and bowel sounds are normal. He exhibits no distension. There is no tenderness. There is no rebound and no guarding.  Patient reports his pain improves with palpation  Musculoskeletal: Normal range of motion. He exhibits no edema or tenderness.  Moves all extremities well.   Neurological: He is alert and oriented to person, place, and time. He has normal strength. No cranial nerve deficit.  Skin: Skin is warm, dry and intact. No rash noted. No erythema. No pallor.  Psychiatric: He has a normal mood and affect. His speech is normal and behavior is normal. His mood appears not anxious.  Nursing note and vitals reviewed.   ED Course  Procedures  Medications  potassium chloride SA (K-DUR,KLOR-CON) CR tablet 40 mEq (not administered)  sodium chloride 0.9 % bolus 1,000 mL (1,000 mLs Intravenous New Bag/Given 03/26/14 0039)  metoCLOPramide (REGLAN) injection 10 mg (10 mg Intravenous Given 03/26/14 0040)  diphenhydrAMINE (BENADRYL) injection 25 mg (25 mg Intravenous Given 03/26/14 0044)    DIAGNOSTIC STUDIES: Oxygen Saturation is 100% on RA, normal by my interpretation.    COORDINATION OF CARE: 12:41 AM Discussed treatment plan with pt at bedside and pt agreed to plan.  Patient was given his test results. He states his pain is gone. We discussed whether to proceed with a CT scan of his abdomen tonight or to have him go home and treat himself for  constipation. If he does not improve with that he can always return to get the CT scan done. At this point his white blood cell count is normal and his pain was easily relieved with IV nausea medication. Patient was started on potassium for his hypokalemia. Patient prefers to go home and he will return if he feels worse.    Labs Review Results for orders placed or performed during the hospital encounter of 03/26/14  Comprehensive metabolic panel  Result Value Ref Range   Sodium 137 135 - 145 mmol/L   Potassium 3.1 (L) 3.5 - 5.1 mmol/L   Chloride 105 96 - 112 mEq/L   CO2 26 19 - 32 mmol/L   Glucose, Bld 126 (H) 70 - 99 mg/dL   BUN 8 6 - 23 mg/dL   Creatinine, Ser 1.12 0.50 - 1.35 mg/dL   Calcium 8.6 8.4 -  10.5 mg/dL   Total Protein 7.0 6.0 - 8.3 g/dL   Albumin 4.0 3.5 - 5.2 g/dL   AST 25 0 - 37 U/L   ALT 29 0 - 53 U/L   Alkaline Phosphatase 60 39 - 117 U/L   Total Bilirubin 0.3 0.3 - 1.2 mg/dL   GFR calc non Af Amer 77 (L) >90 mL/min   GFR calc Af Amer 89 (L) >90 mL/min   Anion gap 6 5 - 15  CBC with Differential  Result Value Ref Range   WBC 5.7 4.0 - 10.5 K/uL   RBC 4.49 4.22 - 5.81 MIL/uL   Hemoglobin 14.1 13.0 - 17.0 g/dL   HCT 40.1 39.0 - 52.0 %   MCV 89.3 78.0 - 100.0 fL   MCH 31.4 26.0 - 34.0 pg   MCHC 35.2 30.0 - 36.0 g/dL   RDW 13.3 11.5 - 15.5 %   Platelets 168 150 - 400 K/uL   Neutrophils Relative % 51 43 - 77 %   Neutro Abs 2.9 1.7 - 7.7 K/uL   Lymphocytes Relative 34 12 - 46 %   Lymphs Abs 1.9 0.7 - 4.0 K/uL   Monocytes Relative 11 3 - 12 %   Monocytes Absolute 0.6 0.1 - 1.0 K/uL   Eosinophils Relative 4 0 - 5 %   Eosinophils Absolute 0.2 0.0 - 0.7 K/uL   Basophils Relative 0 0 - 1 %   Basophils Absolute 0.0 0.0 - 0.1 K/uL  Urinalysis, Routine w reflex microscopic  Result Value Ref Range   Color, Urine YELLOW YELLOW   APPearance CLEAR CLEAR   Specific Gravity, Urine 1.010 1.005 - 1.030   pH 6.0 5.0 - 8.0   Glucose, UA NEGATIVE NEGATIVE mg/dL   Hgb  urine dipstick NEGATIVE NEGATIVE   Bilirubin Urine NEGATIVE NEGATIVE   Ketones, ur NEGATIVE NEGATIVE mg/dL   Protein, ur NEGATIVE NEGATIVE mg/dL   Urobilinogen, UA 0.2 0.0 - 1.0 mg/dL   Nitrite NEGATIVE NEGATIVE   Leukocytes, UA NEGATIVE NEGATIVE  Lipase, blood  Result Value Ref Range   Lipase 44 11 - 59 U/L   Laboratory interpretation all normal except for hypokalemia  Imaging Review Dg Abd Acute W/chest  03/26/2014   CLINICAL DATA:  Generalized mid abdominal pain for 24 hr, gradually increasing. Last normal bowel movement 2 days ago.  EXAM: ACUTE ABDOMEN SERIES (ABDOMEN 2 VIEW & CHEST 1 VIEW)  COMPARISON:  None.  FINDINGS: Normal heart size and pulmonary vascularity. No focal airspace disease or consolidation in the lungs. No blunting of costophrenic angles. No pneumothorax. Mediastinal contours appear intact.  Scattered gas and stool in the colon. No small or large bowel distention. No free intra-abdominal air. No abnormal air-fluid levels. No radiopaque stones. Visualized bones appear intact. Degenerative changes in the hips.  IMPRESSION: No evidence of active pulmonary disease. Nonobstructive bowel gas pattern with scattered stool in the colon.   Electronically Signed   By: Lucienne Capers M.D.   On: 03/26/2014 01:35     EKG Interpretation None      MDM   Final diagnoses:  Abdominal pain  Constipation  Hypokalemia    New Prescriptions   POTASSIUM CHLORIDE SA (K-DUR,KLOR-CON) 20 MEQ TABLET    Take 1 tablet (20 mEq total) by mouth 2 (two) times daily.    Plan discharge  Rolland Porter, MD, FACEP   I personally performed the services described in this documentation, which was scribed in my presence. The recorded information has been reviewed  and considered.  Rolland Porter, MD, FACEP     Janice Norrie, MD 03/26/14 [phone]

## 2014-03-26 NOTE — ED Notes (Signed)
Generalized mid abd pain for 24 hours, gradually increasing intensity. 8/10 now. Nausea but no vomiting. Last BM 2 days ago

## 2014-03-26 NOTE — Discharge Instructions (Signed)
Get miralax and put one dose or 17 g in 8 ounces of water,  take 1 dose every 30 minutes for 2-3 hours or until you  get good results and then once or twice daily to prevent constipation. Take the potassium pills until gone. Return to the ED if your abdominal pain gets worse, you get a fever, have vomiting or have any worsening symptoms.    Constipation Constipation is when a person:  Poops (has a bowel movement) less than 3 times a week.  Has a hard time pooping.  Has poop that is dry, hard, or bigger than normal. HOME CARE   Eat foods with a lot of fiber in them. This includes fruits, vegetables, beans, and whole grains such as brown rice.  Avoid fatty foods and foods with a lot of sugar. This includes french fries, hamburgers, cookies, candy, and soda.  If you are not getting enough fiber from food, take products with added fiber in them (supplements).  Drink enough fluid to keep your pee (urine) clear or pale yellow.  Exercise on a regular basis, or as told by your doctor.  Go to the restroom when you feel like you need to poop. Do not hold it.  Only take medicine as told by your doctor. Do not take medicines that help you poop (laxatives) without talking to your doctor first. GET HELP RIGHT AWAY IF:   You have bright red blood in your poop (stool).  Your constipation lasts more than 4 days or gets worse.  You have belly (abdominal) or butt (rectal) pain.  You have thin poop (as thin as a pencil).  You lose weight, and it cannot be explained. MAKE SURE YOU:   Understand these instructions.  Will watch your condition.  Will get help right away if you are not doing well or get worse. Document Released: 08/24/2007 Document Revised: 03/12/2013 Document Reviewed: 12/17/2012 [facility] Patient Information 2015 [place], [place]. This information is not intended to replace advice given to you by your health care provider. Make sure you discuss any questions you have with your  health care provider.  Hypokalemia Hypokalemia means that the amount of potassium in the blood is lower than normal.Potassium is a chemical, called an electrolyte, that helps regulate the amount of fluid in the body. It also stimulates muscle contraction and helps nerves function properly.Most of the body's potassium is inside of cells, and only a very small amount is in the blood. Because the amount in the blood is so small, minor changes can be life-threatening. CAUSES  Antibiotics.  Diarrhea or vomiting.  Using laxatives too much, which can cause diarrhea.  Chronic kidney disease.  Water pills (diuretics).  Eating disorders (bulimia).  Low magnesium level.  Sweating a lot. SIGNS AND SYMPTOMS  Weakness.  Constipation.  Fatigue.  Muscle cramps.  Mental confusion.  Skipped heartbeats or irregular heartbeat (palpitations).  Tingling or numbness. DIAGNOSIS  Your health care provider can diagnose hypokalemia with blood tests. In addition to checking your potassium level, your health care provider may also check other lab tests. TREATMENT Hypokalemia can be treated with potassium supplements taken by mouth or adjustments in your current medicines. If your potassium level is very low, you may need to get potassium through a vein (IV) and be monitored in the hospital. A diet high in potassium is also helpful. Foods high in potassium are:  Nuts, such as peanuts and pistachios.  Seeds, such as sunflower seeds and pumpkin seeds.  Peas, lentils, and  lima beans.  Whole grain and bran cereals and breads.  Fresh fruit and vegetables, such as apricots, avocado, bananas, cantaloupe, kiwi, oranges, tomatoes, asparagus, and potatoes.  Orange and tomato juices.  Red meats.  Fruit yogurt. HOME CARE INSTRUCTIONS  Take all medicines as prescribed by your health care provider.  Maintain a healthy diet by including nutritious food, such as fruits, vegetables, nuts, whole  grains, and lean meats.  If you are taking a laxative, be sure to follow the directions on the label. SEEK MEDICAL CARE IF:  Your weakness gets worse.  You feel your heart pounding or racing.  You are vomiting or having diarrhea.  You are diabetic and having trouble keeping your blood glucose in the normal range. SEEK IMMEDIATE MEDICAL CARE IF:  You have chest pain, shortness of breath, or dizziness.  You are vomiting or having diarrhea for more than 2 days.  You faint. MAKE SURE YOU:   Understand these instructions.  Will watch your condition.  Will get help right away if you are not doing well or get worse. Document Released: 03/07/2005 Document Revised: 12/26/2012 Document Reviewed: 09/07/2012 [facility] Patient Information 2015 [place], [place]. This information is not intended to replace advice given to you by your health care provider. Make sure you discuss any questions you have with your health care provider.

## 2014-04-14 ENCOUNTER — Encounter: Payer: Self-pay | Admitting: Family Medicine

## 2014-05-08 ENCOUNTER — Ambulatory Visit: Payer: Medicaid Other | Admitting: Physician Assistant

## 2014-05-13 ENCOUNTER — Encounter: Payer: Self-pay | Admitting: Family Medicine

## 2014-05-13 DIAGNOSIS — M1612 Unilateral primary osteoarthritis, left hip: Secondary | ICD-10-CM

## 2014-05-18 ENCOUNTER — Emergency Department (HOSPITAL_COMMUNITY)
Admission: EM | Admit: 2014-05-18 | Discharge: 2014-05-18 | Disposition: A | Discharge status: Home / Self Care | Payer: Medicaid Other | Attending: Emergency Medicine | Admitting: Emergency Medicine

## 2014-05-18 ENCOUNTER — Encounter (HOSPITAL_COMMUNITY): Payer: Self-pay | Admitting: Emergency Medicine

## 2014-05-18 ENCOUNTER — Emergency Department (HOSPITAL_COMMUNITY): Payer: Medicaid Other

## 2014-05-18 DIAGNOSIS — I10 Essential (primary) hypertension: Secondary | ICD-10-CM | POA: Insufficient documentation

## 2014-05-18 DIAGNOSIS — M779 Enthesopathy, unspecified: Secondary | ICD-10-CM | POA: Diagnosis not present

## 2014-05-18 DIAGNOSIS — M778 Other enthesopathies, not elsewhere classified: Secondary | ICD-10-CM

## 2014-05-18 DIAGNOSIS — M79642 Pain in left hand: Secondary | ICD-10-CM | POA: Diagnosis present

## 2014-05-18 DIAGNOSIS — Z791 Long term (current) use of non-steroidal anti-inflammatories (NSAID): Secondary | ICD-10-CM | POA: Diagnosis not present

## 2014-05-18 DIAGNOSIS — Z79899 Other long term (current) drug therapy: Secondary | ICD-10-CM | POA: Diagnosis not present

## 2014-05-18 MED ORDER — NAPROXEN 500 MG PO TABS
500.0000 mg | ORAL_TABLET | Freq: Two times a day (BID) | ORAL | Status: DC
Start: 2014-05-18 — End: 2015-10-05

## 2014-05-18 MED ORDER — OXYCODONE-ACETAMINOPHEN 5-325 MG PO TABS
1.0000 | ORAL_TABLET | Freq: Once | ORAL | Status: AC
  Administered 2014-05-18: 1 via ORAL
  Filled 2014-05-18: qty 1

## 2014-05-18 MED ORDER — HYDROCODONE-ACETAMINOPHEN 5-325 MG PO TABS: ORAL_TABLET | ORAL | Status: DC

## 2014-05-18 NOTE — ED Notes (Signed)
Pt states he had some edema in his L hand on Fri, was okay yesterday. Pt reports he woke up this am and his hand is much more swollen. Thinks it may be an insect bite.

## 2014-05-18 NOTE — ED Provider Notes (Signed)
CSN: [id number]     Arrival date & time 05/18/14  1413 History  This chart was scribed for non-physician practitioner, Kem Parkinson, PA-C, working with Fredia Sorrow, MD, by Chester Holstein, ED Scribe. This patient was seen in room APFT24/APFT24 and the patient's care was started at 3:18 PM.     Chief Complaint  Patient presents with  . Insect Bite     The history is provided by the patient. No language interpreter was used.   HPI Comments: Corey Thomas is a 49 y.o. male with PMHx of HTN who presents to the Emergency Department complaining of worsening swelling to dorsum of left hand and pain especially along the extensor  tendon of the 3rd and 4th digits with onset 2 days ago. Pain is wore with movement of his hand.  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 Pt with h/o of arthritis but denies h/o gout. Pt is left hand dominant.  He denies any known injury, numbness, fever, redness, pain along the flexor tendons and chills.  Past Medical History  Diagnosis Date  . Hypertension    History reviewed. No pertinent past surgical history. Family History  Problem Relation Age of Onset  . Healthy Sister   . Healthy Brother   . Healthy Sister   . Diabetes Other   . Osteoarthritis Other    History  Substance Use Topics  . Smoking status: Never Smoker   . Smokeless tobacco: Current User    Types: Snuff  . Alcohol Use: 1.8 oz/week    3 Cans of beer per week    Review of Systems  Constitutional: Negative for fever and chills.  Genitourinary: Negative for dysuria and difficulty urinating.  Musculoskeletal: Positive for arthralgias (left hand pain). Negative for joint swelling.  Skin: Negative for color change and wound.  Neurological: Negative for weakness and numbness.  All other systems reviewed and are negative.     Allergies  Review of patient's allergies  indicates no known allergies.  Home Medications   Prior to Admission medications   Medication Sig Start Date End Date Taking? Authorizing Provider  benazepril (LOTENSIN) 10 MG tablet TAKE ONE TABLET BY MOUTH DAILY. 01/17/14   Lonie Peak Dixon, PA-C  cyclobenzaprine (FLEXERIL) 10 MG tablet Take 1 tablet (10 mg total) by mouth 2 (two) times daily as needed for muscle spasms. 11/21/13   Lonie Peak Dixon, PA-C  diclofenac (VOLTAREN) 75 MG EC tablet Take 75 mg by mouth 2 (two) times daily.    Historical Provider, MD  HYDROcodone-acetaminophen (NORCO/VICODIN) 5-325 MG per tablet Take 1 tablet by mouth every 6 (six) hours as needed for moderate pain. Must last 28 days 01/15/14   Kathrynn Ducking, MD  methocarbamol (ROBAXIN) 500 MG tablet Take 500 mg by mouth 4 (four) times daily.    Historical Provider, MD  naproxen (NAPROSYN) 500 MG tablet Take 1 tablet (500 mg total) by mouth 2 (two) times daily. 9/37/16   Delora Fuel, MD  potassium chloride SA (K-DUR,KLOR-CON) 20 MEQ tablet Take 1 tablet (20 mEq total) by mouth 2 (two) times daily. 03/26/14   Janice Norrie, MD   BP 156/92 mmHg  Pulse 87  Temp(Src) 98.8 F (37.1 C) (Oral)  Resp 14  Ht 6' (1.829 m)  Wt 205 lb (92.987 kg)  BMI 27.80 kg/m2  SpO2 99% Physical Exam  Constitutional: He  is oriented to person, place, and time. He appears well-developed and well-nourished.  HENT:  Head: Normocephalic.  Eyes: Conjunctivae are normal.  Neck: Normal range of motion. Neck supple.  Cardiovascular: Normal rate, regular rhythm and normal heart sounds.  Exam reveals no friction rub.   No murmur heard. Distal pulses intact  Pulmonary/Chest: Effort normal and breath sounds normal. No respiratory distress. He has no wheezes. He has no rales.  Musculoskeletal: Normal range of motion. He exhibits tenderness. He exhibits no edema.       Left hand: He exhibits tenderness and swelling. He exhibits no bony tenderness, normal two-point discrimination, normal capillary refill  and no deformity. Normal sensation noted. Normal strength noted.       Hands: Pt has flexion and extension of the left fingers. No erythema  Neurological: He is alert and oriented to person, place, and time.  Neurovascularly intact  Skin: Skin is warm and dry.  Psychiatric: He has a normal mood and affect. His behavior is normal.  Nursing note and vitals reviewed.   ED Course  Procedures (including critical care time) DIAGNOSTIC STUDIES: Oxygen Saturation is 99% on room air, normal by my interpretation.    COORDINATION OF CARE: 3:25 PM Discussed treatment plan with patient at beside, the patient agrees with the plan and has no further questions at this time.   Labs Review Labs Reviewed - No data to display  Imaging Review No results found.   EKG Interpretation None      MDM   Final diagnoses:  Left hand tendonitis   Pt is well appearing, NV intact left hand.  Pt has ROM of the fingers.  No tenderness of the flexor tendons.  Sx's likely related to excessive use.  Pt agrees to close f/u with orthopedics.  Bulky dressing applied for comfort.  Pt also agrees to return here in 1-2 days for recheck if worsening  I personally performed the services described in this documentation, which was scribed in my presence. The recorded information has been reviewed and is accurate.;    Layne Dilauro L. Vanessa Guadalupe, PA-C 05/20/14 [place], MD 05/22/14 1840

## 2014-05-18 NOTE — Discharge Instructions (Signed)
Repetitive Strain Injuries  Repetitive strain injuries (RSIs) result from overuse or misuse of soft tissues including muscles, tendons, or nerves. Tendons are the cord-like structures that attach muscles to bones. RSIs can affect almost any part of the body. However, RSIs are most common in the arms (thumbs, wrists, elbows, shoulders) and legs (ankles, knees). Common medical conditions that are often caused by repetitive strain include carpal tunnel syndrome, tennis or golfer's elbow, bursitis, and tendonitis. If RSIs are treated early, and therepeated activity is reduced or removed, the severity and length of your problems can usually be reduced. RSIs are also called cumulative trauma disorders (CTD).   CAUSES   Many RSIs occur due to repeating the same activity at work over weeks or months without sufficient rest, such as prolonged typing. RSIs also commonly occur when a hobby or sport is done repeatedly without sufficient rest. RSIs can also occur due to repeated strain or stress on a body part in someone who has one or more risk factors for RSIs.  RISK FACTORS  Workplace risk factors   Frequent computer use, especially if your workstation is not adjusted for your body type.   Infrequent rest breaks.   Working in a high-pressure environment.   Working at a fast pace.   Repeating the same motion, such as frequent typing.   Working in an awkward position or holding the same position for a long time.   Forceful movements such as lifting, pulling, or pushing.   Vibration caused by using power tools.   Working in cold temperatures.   Job stress.  Personal risk factors   Poor posture.   Being loose-jointed.   Not exercising regularly.   Being overweight.   Arthritis, diabetes, thyroid problems, or other long-term (chronic)medical conditions.   Vitamin deficiencies.   Keeping your fingernails long.   An unhealthy, stressful, or inactive lifestyle.   Not sleeping well.  SYMPTOMS   Symptoms often  begin at work but become more noticeable after the repeated stress has ended. For example, you may develop fatigue or soreness in your wrist while typingat work, and at night you may develop numbness and tingling in your fingers. Common symptoms include:    Burning, shooting, or aching pain, especially in the fingers, palms, wrists, forearms, or shoulders.   Tenderness.   Swelling.   Tingling, numbness, or loss of feeling.   Pain with certain activities, such as turning a doorknob or reaching above your head.   Weakness, heaviness, or loss of coordination in yourhand.   Muscle spasms or tightness.  In some cases, symptoms can become so intense that it is difficult to perform everyday tasks. Symptoms that do not improve with rest may indicate a more serious condition.   DIAGNOSIS   Your caregiver may determine the type ofRSI you have based on your medical evaluation and a description of your activities.   TREATMENT   Treatment depends on the severity and type of RSI you have. Your caregiver may recommend rest for the affected body part, medicines, and physical or occupational therapy to reduce pain, swelling, and soreness. Discuss the activities you do repeatedly with your caregiver. Your caregiver can help you decide whether you need to change your activities. An RSI may take months or years to heal, especially if the affected body part gets insufficient rest. In some cases, such as severe carpal tunnel syndrome, surgery may be recommended.  PREVENTION   Talk with your supervisor to make sure you have the proper equipment   for your work station.   Maintain good posture at your desk or work station with:   Feet flat on the floor.   Knees directly over the feet, bent at a right angle.   Lower back supported by your chair or a cushion in the curve of your lower back.   Shoulders and arms relaxed and at your sides.   Neck relaxed and not bent forwards or backwards.   Your desk and computer workstation  properly adjusted to your body type.   Your chair adjusted so there is no excess pressure on the back of your thighs.   The keyboard resting above your thighs. You should be able to reach the keys with your elbows at your side, bent at a right angle. Your arms should be supported on forearm rests, with your forearms parallel to the ground.   The computer mouse within easy reach.   The monitor directly in front of you, so that your eyes are aligned with the top of the screen. The screen should be about 15 to 25 inches from your eyes.   While typing, keep your wrist straight, in a neutral position. Move your entire arm when you move your mouse or when typing hard-to-reach keys.   Only use your computer as much as you need to for work. Do not use it during breaks.   Take breaks often from any repeated activity. Alternate with another task which requires you to use different muscles, or rest at least once every hour.   Change positions regularly. If you spend a lot of time sitting, get up, walk around, and stretch.   Do not hold pens or pencils tightly when writing.   Exercise regularly.   Maintain a normal weight.   Eat a diet with plenty of vegetables, whole grains, and fruit.   Get sufficient, restful sleep.  HOME CARE INSTRUCTIONS   If your caregiver prescribed medicine to help reduce swelling, take it as directed.   Only take over-the-counter or prescription medicines for pain, discomfort, or fever as directed by your caregiver.   Reduce, and if needed, stopthe activities that are causing your problems until you have no further symptoms.If your symptoms are work-related, you may need to talk to your supervisor about changing your activities.   When symptoms develop, put ice or a cold pack on the aching area.   Put ice in a plastic bag.   Place a towel between your skin and the bag.   Leave the ice on for 15-20 minutes.   If you were given a splint to keep your wrist from bending, wear it as  instructed. It is important to wear the splint at night. Use the splint for as long as your caregiver recommends.  SEEK MEDICAL CARE IF:   You develop new problems.   Your problems do not get better with medicine.  MAKE SURE YOU:   Understand these instructions.   Will watch your condition.   Will get help right away if you are not doing well or get worse.  Document Released: 02/25/2002 Document Revised: 09/06/2011 Document Reviewed: 04/28/2011  [employer] Patient Information 2015 [employer]. This information is not intended to replace advice given to you by your health care provider. Make sure you discuss any questions you have with your health care provider.

## 2014-05-21 ENCOUNTER — Encounter: Payer: Self-pay | Admitting: Family Medicine

## 2014-06-22 ENCOUNTER — Emergency Department (HOSPITAL_COMMUNITY)
Admission: EM | Admit: 2014-06-22 | Discharge: 2014-06-22 | Disposition: A | Discharge status: Home / Self Care | Payer: Medicaid Other | Attending: Emergency Medicine | Admitting: Emergency Medicine

## 2014-06-22 ENCOUNTER — Encounter (HOSPITAL_COMMUNITY): Payer: Self-pay

## 2014-06-22 DIAGNOSIS — J0100 Acute maxillary sinusitis, unspecified: Secondary | ICD-10-CM | POA: Diagnosis not present

## 2014-06-22 DIAGNOSIS — I10 Essential (primary) hypertension: Secondary | ICD-10-CM | POA: Insufficient documentation

## 2014-06-22 DIAGNOSIS — R0981 Nasal congestion: Secondary | ICD-10-CM | POA: Diagnosis present

## 2014-06-22 DIAGNOSIS — Z79899 Other long term (current) drug therapy: Secondary | ICD-10-CM | POA: Insufficient documentation

## 2014-06-22 MED ORDER — PREDNISONE 50 MG PO TABS
60.0000 mg | ORAL_TABLET | Freq: Once | ORAL | Status: AC
  Administered 2014-06-22: 60 mg via ORAL
  Filled 2014-06-22 (×2): qty 1

## 2014-06-22 MED ORDER — ACETAMINOPHEN-CODEINE #3 300-30 MG PO TABS
2.0000 | ORAL_TABLET | Freq: Once | ORAL | Status: AC
  Administered 2014-06-22: 2 via ORAL
  Filled 2014-06-22: qty 2

## 2014-06-22 MED ORDER — LORATADINE-PSEUDOEPHEDRINE ER 5-120 MG PO TB12: ORAL_TABLET | ORAL | Status: DC

## 2014-06-22 MED ORDER — ACETAMINOPHEN-CODEINE #3 300-30 MG PO TABS: 1.0000 | ORAL_TABLET | Freq: Four times a day (QID) | ORAL | Status: DC | PRN

## 2014-06-22 MED ORDER — DIPHENHYDRAMINE HCL 25 MG PO CAPS
25.0000 mg | ORAL_CAPSULE | Freq: Once | ORAL | Status: AC
  Administered 2014-06-22: 25 mg via ORAL
  Filled 2014-06-22: qty 1

## 2014-06-22 MED ORDER — PREDNISONE 10 MG PO TABS: ORAL_TABLET | ORAL | Status: DC

## 2014-06-22 NOTE — ED Notes (Signed)
Patient states sinus infection X1 week with nasal drainage

## 2014-06-22 NOTE — ED Notes (Signed)
Pt alert & oriented x4, stable gait. Patient given discharge instructions, paperwork & prescription(s). Patient informed not to drive, operate any equipment & handel any important documents 4 hours after taking pain medication. Patient  instructed to stop at the registration desk to finish any additional paperwork. Patient  verbalized understanding. Pt left department w/ no further questions. 

## 2014-06-22 NOTE — Discharge Instructions (Signed)
Please use prednisone taper as suggested. Use Claritin-D each morning. Use Benadryl at bedtime or congestion. May use Tylenol or ibuprofen for mild discomfort. May use Tylenol codeine for more severe pain. This medication may cause drowsiness, as well as the Benadryl. Please use these medications with caution. Sinusitis Sinusitis is redness, soreness, and puffiness (inflammation) of the air pockets in the bones of your face (sinuses). The redness, soreness, and puffiness can cause air and mucus to get trapped in your sinuses. This can allow germs to grow and cause an infection.  HOME CARE   Drink enough fluids to keep your pee (urine) clear or pale yellow.  Use a humidifier in your home.  Run a hot shower to create steam in the bathroom. Sit in the bathroom with the door closed. Breathe in the steam 3-4 times a day.  Put a warm, moist washcloth on your face 3-4 times a day, or as told by your doctor.  Use salt water sprays (saline sprays) to wet the thick fluid in your nose. This can help the sinuses drain.  Only take medicine as told by your doctor. GET HELP RIGHT AWAY IF:   Your pain gets worse.  You have very bad headaches.  You are sick to your stomach (nauseous).  You throw up (vomit).  You are very sleepy (drowsy) all the time.  Your face is puffy (swollen).  Your vision changes.  You have a stiff neck.  You have trouble breathing. MAKE SURE YOU:   Understand these instructions.  Will watch your condition.  Will get help right away if you are not doing well or get worse. Document Released: 08/24/2007 Document Revised: 11/30/2011 Document Reviewed: 10/11/2011 [facility] Patient Information 2015 [place], [place]. This information is not intended to replace advice given to you by your health care provider. Make sure you discuss any questions you have with your health care provider.

## 2014-06-22 NOTE — ED Provider Notes (Signed)
CSN: [id number]     Arrival date & time 06/22/14  2137 History   First MD Initiated Contact with Patient 06/22/14 2300     Chief Complaint  Patient presents with  . Sinusitis     (Consider location/radiation/quality/duration/timing/severity/associated sxs/prior Treatment) Patient is a 49 y.o. male presenting with sinusitis. The history is provided by the patient.  Sinusitis Pain details:    Location:  Maxillary   Quality:  Aching and pressure   Duration:  1 week   Timing:  Intermittent Duration:  1 week Progression:  Worsening Chronicity:  New Context: allergies   Relieved by:  Nothing Worsened by:  Nothing tried Associated symptoms: congestion and headaches   Associated symptoms: no chills, no fever, no shortness of breath and no vomiting   Risk factors: no diabetes, no immune deficiency and no smoke exposure     Past Medical History  Diagnosis Date  . Hypertension    History reviewed. No pertinent past surgical history. Family History  Problem Relation Age of Onset  . Healthy Sister   . Healthy Brother   . Healthy Sister   . Diabetes Other   . Osteoarthritis Other    History  Substance Use Topics  . Smoking status: Never Smoker   . Smokeless tobacco: Current User    Types: Snuff  . Alcohol Use: 1.8 oz/week    3 Cans of beer per week    Review of Systems  Constitutional: Negative for fever and chills.  HENT: Positive for congestion.   Respiratory: Negative for shortness of breath.   Gastrointestinal: Negative for vomiting.  Neurological: Positive for headaches.  All other systems reviewed and are negative.     Allergies  Review of patient's allergies indicates no known allergies.  Home Medications   Prior to Admission medications   Medication Sig Start Date End Date Taking? Authorizing Provider  benazepril (LOTENSIN) 10 MG tablet TAKE ONE TABLET BY MOUTH DAILY. 01/17/14   Orlena Sheldon, PA-C  celecoxib (CELEBREX) 200 MG capsule Take 200 mg by mouth  daily.    Historical Provider, MD  HYDROcodone-acetaminophen (NORCO/VICODIN) 5-325 MG per tablet Take one-two tabs po q 4-6 hrs prn pain 05/18/14   Tammi Triplett, PA-C  naproxen (NAPROSYN) 500 MG tablet Take 1 tablet (500 mg total) by mouth 2 (two) times daily with a meal. 05/18/14   Tammi Triplett, PA-C  PRESCRIPTION MEDICATION Inject as directed once. Cortisone Injection    Historical Provider, MD   BP 153/89 mmHg  Pulse 76  Temp(Src) 99.3 F (37.4 C) (Oral)  Resp 20  Ht 6' (1.829 m)  Wt 199 lb (90.266 kg)  BMI 26.98 kg/m2  SpO2 100% Physical Exam  Constitutional: He is oriented to person, place, and time. He appears well-developed and well-nourished.  Non-toxic appearance.  HENT:  Head: Normocephalic.  Right Ear: Tympanic membrane and external ear normal.  Left Ear: Tympanic membrane and external ear normal.  Nasal congestion present, left greater than right. There is puffiness of the upper and lower left eyelids.  There is no pain to percussion of the upper or lower teeth on the left. The airway is patent. There is mild soreness to percussion of the sinuses.  Eyes: EOM and lids are normal. Pupils are equal, round, and reactive to light.  Neck: Normal range of motion. Neck supple. Carotid bruit is not present.  Cardiovascular: Normal rate, regular rhythm, normal heart sounds, intact distal pulses and normal pulses.   Pulmonary/Chest: Breath sounds normal. No respiratory distress.  Abdominal: Soft. Bowel sounds are normal. There is no tenderness. There is no guarding.  Musculoskeletal: Normal range of motion.  Lymphadenopathy:       Head (right side): No submandibular adenopathy present.       Head (left side): No submandibular adenopathy present.    He has no cervical adenopathy.  Neurological: He is alert and oriented to person, place, and time. He has normal strength. No cranial nerve deficit or sensory deficit.  Skin: Skin is warm and dry.  Psychiatric: He has a normal mood  and affect. His speech is normal.  Nursing note and vitals reviewed.   ED Course  Procedures (including critical care time) Labs Review Labs Reviewed - No data to display  Imaging Review No results found.   EKG Interpretation None      MDM  The examination suggest a sinus infection. Vital signs are nonacute. Pulse oximetry is 100% on room air.  The patient will be treated with Claritin-D each morning, Benadryl at bedtime. Tylenol codeine for soreness. Prednisone taper.    Final diagnoses:  None    *I have reviewed nursing notes, vital signs, and all appropriate lab and imaging results for this patient.Lily Kocher, PA-C 06/22/14 2324  Daleen Bo, MD 06/22/14 [phone]

## 2014-06-26 ENCOUNTER — Encounter: Payer: Self-pay | Admitting: Family Medicine

## 2015-01-15 ENCOUNTER — Emergency Department (HOSPITAL_COMMUNITY)
Admission: EM | Admit: 2015-01-15 | Discharge: 2015-01-15 | Disposition: A | Discharge status: Home / Self Care | Payer: Medicaid Other | Attending: Emergency Medicine | Admitting: Emergency Medicine

## 2015-01-15 ENCOUNTER — Emergency Department (HOSPITAL_COMMUNITY): Payer: Medicaid Other

## 2015-01-15 ENCOUNTER — Encounter (HOSPITAL_COMMUNITY): Payer: Self-pay | Admitting: Emergency Medicine

## 2015-01-15 DIAGNOSIS — Y998 Other external cause status: Secondary | ICD-10-CM | POA: Insufficient documentation

## 2015-01-15 DIAGNOSIS — M19021 Primary osteoarthritis, right elbow: Secondary | ICD-10-CM | POA: Diagnosis not present

## 2015-01-15 DIAGNOSIS — Z791 Long term (current) use of non-steroidal anti-inflammatories (NSAID): Secondary | ICD-10-CM | POA: Insufficient documentation

## 2015-01-15 DIAGNOSIS — W1789XA Other fall from one level to another, initial encounter: Secondary | ICD-10-CM | POA: Insufficient documentation

## 2015-01-15 DIAGNOSIS — I10 Essential (primary) hypertension: Secondary | ICD-10-CM | POA: Insufficient documentation

## 2015-01-15 DIAGNOSIS — Z79899 Other long term (current) drug therapy: Secondary | ICD-10-CM | POA: Diagnosis not present

## 2015-01-15 DIAGNOSIS — S59901A Unspecified injury of right elbow, initial encounter: Secondary | ICD-10-CM | POA: Diagnosis present

## 2015-01-15 DIAGNOSIS — Y939 Activity, unspecified: Secondary | ICD-10-CM | POA: Diagnosis not present

## 2015-01-15 DIAGNOSIS — Y929 Unspecified place or not applicable: Secondary | ICD-10-CM | POA: Diagnosis not present

## 2015-01-15 HISTORY — DX: Unspecified osteoarthritis, unspecified site: M19.90

## 2015-01-15 MED ORDER — ACETAMINOPHEN-CODEINE #3 300-30 MG PO TABS: 1.0000 | ORAL_TABLET | Freq: Four times a day (QID) | ORAL | Status: DC | PRN

## 2015-01-15 MED ORDER — DEXAMETHASONE 4 MG PO TABS: 4.0000 mg | ORAL_TABLET | Freq: Two times a day (BID) | ORAL | Status: DC

## 2015-01-15 MED ORDER — MELOXICAM 15 MG PO TABS: 15.0000 mg | ORAL_TABLET | Freq: Every day | ORAL | Status: DC

## 2015-01-15 NOTE — Discharge Instructions (Signed)
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 No fracture or dislocation noted. Please use the Ace wrap for the next 4 or 5 days. Please use medications as prescribed. Please take Mobic and Decadron with food. Tylenol codeine may cause drowsiness, please use this medication with caution. Please see Dr. Dennard Schaumann as soon as possible for orthopedic referral concerning your elbow. Osteoarthritis Osteoarthritis is a disease that causes soreness and inflammation of a joint. It occurs when the cartilage at the affected joint wears down. Cartilage acts as a cushion, covering the ends of bones where they meet to form a joint. Osteoarthritis is the most common form of arthritis. It often occurs in older people. The joints affected most often by this condition include those in the:  Ends of the fingers.  Thumbs.  Neck.  Lower back.  Knees.  Hips. CAUSES  Over time, the cartilage that covers the ends of bones begins to wear away. This causes bone to rub on bone, producing pain and stiffness in the affected joints.  RISK FACTORS Certain factors can increase your chances of having osteoarthritis, including:  Older age.  Excessive body weight.  Overuse of joints.  Previous joint injury. SIGNS AND SYMPTOMS   Pain, swelling, and stiffness in the joint.  Over time, the joint may lose its normal shape.  Small deposits of bone (osteophytes) may grow on the edges of the joint.  Bits of bone or cartilage can break off and float inside the joint space. This may cause more pain and damage. DIAGNOSIS  Your health care provider will do a physical exam and ask about your symptoms. Various tests may be ordered, such as:  X-rays of the affected joint.  Blood tests to rule out other types of arthritis. Additional tests may be used to diagnose your condition. TREATMENT  Goals of treatment are to control pain and improve joint function. Treatment plans may include:  A prescribed exercise program  that allows for rest and joint relief.  A weight control plan.  Pain relief techniques, such as:  Properly applied heat and cold.  Electric pulses delivered to nerve endings under the skin (transcutaneous electrical nerve stimulation [TENS]).  Massage.  Certain nutritional supplements.  Medicines to control pain, such as:  Acetaminophen.  Nonsteroidal anti-inflammatory drugs (NSAIDs), such as naproxen.  Narcotic or central-acting agents, such as tramadol.  Corticosteroids. These can be given orally or as an injection.  Surgery to reposition the bones and relieve pain (osteotomy) or to remove loose pieces of bone and cartilage. Joint replacement may be needed in advanced states of osteoarthritis. HOME CARE INSTRUCTIONS   Take medicines only as directed by your health care provider.  Maintain a healthy weight. Follow your health care provider's instructions for weight control. This may include dietary instructions.  Exercise as directed. Your health care provider can recommend specific types of exercise. These may include:  Strengthening exercises. These are done to strengthen the muscles that support joints affected by arthritis. They can be performed with weights or with exercise bands to add resistance.  Aerobic activities. These are exercises, such as brisk walking or low-impact aerobics, that get your heart pumping.  Range-of-motion activities. These keep your joints limber.  Balance and agility exercises. These help you maintain daily living skills.  Rest your affected joints as directed by your health care provider.  Keep all follow-up visits as directed by your health care provider. SEEK MEDICAL CARE IF:   Your skin turns red.  You develop a rash in  addition to your joint pain.  You have worsening joint pain.  You have a fever along with joint or muscle aches. SEEK IMMEDIATE MEDICAL CARE IF:  You have a significant loss of weight or appetite.  You have  night sweats. [place] of Arthritis and Musculoskeletal and Skin Diseases: www.niams.SouthExposed.es  [employer] on Aging: http://kim-miller.com/  American College of Rheumatology: www.rheumatology.org   This information is not intended to replace advice given to you by your health care provider. Make sure you discuss any questions you have with your health care provider.   Document Released: 03/07/2005 Document Revised: 03/28/2014 Document Reviewed: 11/12/2012 Elsevier Interactive Patient Education 2016 [place] therapy can help ease sore, stiff, injured, and tight muscles and joints. Heat relaxes your muscles, which may help ease your pain. Heat therapy should only be used on old, pre-existing, or long-lasting (chronic) injuries. Do not use heat therapy unless told by your doctor. HOW TO USE HEAT THERAPY There are several different kinds of heat therapy, including:  Moist heat pack.  Warm water bath.  Hot water bottle.  Electric heating pad.  Heated gel pack.  Heated wrap.  Electric heating pad. GENERAL HEAT THERAPY RECOMMENDATIONS   Do not sleep while using heat therapy. Only use heat therapy while you are awake.  Your skin may turn pink while using heat therapy. Do not use heat therapy if your skin turns red.  Do not use heat therapy if you have new pain.  High heat or long exposure to heat can cause burns. Be careful when using heat therapy to avoid burning your skin.  Do not use heat therapy on areas of your skin that are already irritated, such as with a rash or sunburn. GET HELP IF:   You have blisters, redness, swelling (puffiness), or numbness.  You have new pain.  Your pain is worse. MAKE SURE YOU:  Understand these instructions.  Will watch your condition.  Will get help right away if you are not doing well or get worse.   This information is not intended to replace advice given to you by your  health care provider. Make sure you discuss any questions you have with your health care provider.   Document Released: 05/30/2011 Document Revised: 03/28/2014 Document Reviewed: 04/30/2013 Elsevier Interactive Patient Education [employer].

## 2015-01-15 NOTE — ED Notes (Signed)
Pt c/o of RT arm pain after a fall a month ago. No deformity or edema noted.

## 2015-01-15 NOTE — ED Notes (Signed)
Pt verbalized understanding of no driving and to use caution within 4 hours of taking pain meds due to meds cause drowsiness 

## 2015-01-15 NOTE — ED Provider Notes (Signed)
CSN: [id number]     Arrival date & time 01/15/15  1631 History   First MD Initiated Contact with Patient 01/15/15 1740     Chief Complaint  Patient presents with  . Arm Pain     (Consider location/radiation/quality/duration/timing/severity/associated sxs/prior Treatment) HPI Comments: Patient is a 49 year old male who presents to the emergency department with a complaint of right arm pain.  The patient says that approximately a month ago he sustained a fall from about 2 feet and injured his right arm. He did not have any evaluated at that time, but states he continues to have pain and discomfort in the right arm area. The elbow bothers him more than anything else. He notices a small area of swelling from time to time in this area. This is particularly uncomfortable with lifting or with turning his forearm in a particular position.  The history is provided by the patient.    Past Medical History  Diagnosis Date  . Hypertension   . Arthritis    History reviewed. No pertinent past surgical history. Family History  Problem Relation Age of Onset  . Healthy Sister   . Healthy Brother   . Healthy Sister   . Diabetes Other   . Osteoarthritis Other    Social History  Substance Use Topics  . Smoking status: Never Smoker   . Smokeless tobacco: Current User    Types: Chew  . Alcohol Use: 1.8 oz/week    3 Cans of beer per week    Review of Systems  Musculoskeletal: Positive for arthralgias.  All other systems reviewed and are negative.     Allergies  Review of patient's allergies indicates no known allergies.  Home Medications   Prior to Admission medications   Medication Sig Start Date End Date Taking? Authorizing Provider  acetaminophen-codeine (TYLENOL #3) 300-30 MG per tablet Take 1-2 tablets by mouth every 6 (six) hours as needed for moderate pain. 06/22/14   Lily Kocher, PA-C  benazepril (LOTENSIN) 10 MG tablet TAKE ONE TABLET BY MOUTH DAILY. 01/17/14   Orlena Sheldon,  PA-C  HYDROcodone-acetaminophen (NORCO/VICODIN) 5-325 MG per tablet Take one-two tabs po q 4-6 hrs prn pain 05/18/14   Tammy Triplett, PA-C  loratadine-pseudoephedrine (CLARITIN-D 12 HOUR) 5-120 MG per tablet 1 tablet each morning for congestion 06/22/14   Lily Kocher, PA-C  naproxen (NAPROSYN) 500 MG tablet Take 1 tablet (500 mg total) by mouth 2 (two) times daily with a meal. 05/18/14   Tammy Triplett, PA-C  predniSONE (DELTASONE) 10 MG tablet 5,4,3,2,1 - take with food Patient not taking: Reported on 01/15/2015 06/22/14   Lily Kocher, PA-C   BP 133/85 mmHg  Pulse 67  Temp(Src) 97.8 F (36.6 C) (Oral)  Resp 18  Ht 6\' 1"  (1.854 m)  Wt 205 lb (92.987 kg)  BMI 27.05 kg/m2  SpO2 100% Physical Exam  Constitutional: He is oriented to person, place, and time. He appears well-developed and well-nourished.  Non-toxic appearance.  HENT:  Head: Normocephalic.  Right Ear: Tympanic membrane and external ear normal.  Left Ear: Tympanic membrane and external ear normal.  Eyes: EOM and lids are normal. Pupils are equal, round, and reactive to light.  Neck: Normal range of motion. Neck supple. Carotid bruit is not present.  Cardiovascular: Normal rate, regular rhythm, normal heart sounds, intact distal pulses and normal pulses.   Pulmonary/Chest: Breath sounds normal. No respiratory distress.  Abdominal: Soft. Bowel sounds are normal. There is no tenderness. There is no guarding.  Musculoskeletal:  Right elbow: He exhibits decreased range of motion. Tenderness found. Lateral epicondyle tenderness noted. No olecranon process tenderness noted.  Lymphadenopathy:       Head (right side): No submandibular adenopathy present.       Head (left side): No submandibular adenopathy present.    He has no cervical adenopathy.  Neurological: He is alert and oriented to person, place, and time. He has normal strength. No cranial nerve deficit or sensory deficit.  Grip symmetrical  Skin: Skin is warm and dry.   Psychiatric: He has a normal mood and affect. His speech is normal.  Nursing note and vitals reviewed.   ED Course  Procedures (including critical care time) Labs Review Labs Reviewed - No data to display  Imaging Review No results found. I have personally reviewed and evaluated these images and lab results as part of my medical decision-making.   EKG Interpretation None      MDM  X-ray of the right elbow reveals olecranon spurring with degenerative joint narrowing. Patient will be treated with some open, Tylenol codeine, and Decadron. Patient is referred to orthopedics for additional evaluation of this degenerative change.    Final diagnoses:  None    *I have reviewed nursing notes, vital signs, and all appropriate lab and imaging results for this patient.[id number], PA-C 01/17/15 0102  Dorie Rank, MD 01/20/15 [phone]

## 2015-04-24 ENCOUNTER — Emergency Department (HOSPITAL_COMMUNITY)
Admission: EM | Admit: 2015-04-24 | Discharge: 2015-04-24 | Disposition: A | Discharge status: Home / Self Care | Payer: Medicaid Other | Attending: Emergency Medicine | Admitting: Emergency Medicine

## 2015-04-24 ENCOUNTER — Encounter (HOSPITAL_COMMUNITY): Payer: Self-pay | Admitting: Emergency Medicine

## 2015-04-24 DIAGNOSIS — M1990 Unspecified osteoarthritis, unspecified site: Secondary | ICD-10-CM | POA: Insufficient documentation

## 2015-04-24 DIAGNOSIS — Z7952 Long term (current) use of systemic steroids: Secondary | ICD-10-CM | POA: Insufficient documentation

## 2015-04-24 DIAGNOSIS — Z791 Long term (current) use of non-steroidal anti-inflammatories (NSAID): Secondary | ICD-10-CM | POA: Insufficient documentation

## 2015-04-24 DIAGNOSIS — I10 Essential (primary) hypertension: Secondary | ICD-10-CM | POA: Insufficient documentation

## 2015-04-24 DIAGNOSIS — K529 Noninfective gastroenteritis and colitis, unspecified: Secondary | ICD-10-CM | POA: Diagnosis not present

## 2015-04-24 DIAGNOSIS — R109 Unspecified abdominal pain: Secondary | ICD-10-CM | POA: Diagnosis present

## 2015-04-24 LAB — COMPREHENSIVE METABOLIC PANEL
ALK PHOS: 56 U/L (ref 38–126)
ALT: 20 U/L (ref 17–63)
ANION GAP: 11 (ref 5–15)
AST: 26 U/L (ref 15–41)
Albumin: 4.3 g/dL (ref 3.5–5.0)
BILIRUBIN TOTAL: 1.1 mg/dL (ref 0.3–1.2)
BUN: 15 mg/dL (ref 6–20)
CALCIUM: 8.9 mg/dL (ref 8.9–10.3)
CO2: 24 mmol/L (ref 22–32)
CREATININE: 1.19 mg/dL (ref 0.61–1.24)
Chloride: 102 mmol/L (ref 101–111)
GFR calc non Af Amer: >60 mL/min (ref >60)
Glucose, Bld: 106 mg/dL — ABNORMAL HIGH (ref 65–99)
Potassium: 3.4 mmol/L — ABNORMAL LOW (ref 3.5–5.1)
SODIUM: 137 mmol/L (ref 135–145)
Total Protein: 8.1 g/dL (ref 6.5–8.1)

## 2015-04-24 LAB — CBC
HCT: 44 % (ref 39.0–52.0)
HEMOGLOBIN: 15.5 g/dL (ref 13.0–17.0)
MCH: 31.5 pg (ref 26.0–34.0)
MCHC: 35.2 g/dL (ref 30.0–36.0)
MCV: 89.4 fL (ref 78.0–100.0)
PLATELETS: 188 10*3/uL (ref 150–400)
RBC: 4.92 MIL/uL (ref 4.22–5.81)
RDW: 13.2 % (ref 11.5–15.5)
WBC: 5.1 10*3/uL (ref 4.0–10.5)

## 2015-04-24 LAB — LIPASE, BLOOD: Lipase: 39 U/L (ref 11–51)

## 2015-04-24 MED ORDER — ONDANSETRON 4 MG PO TBDP
4.0000 mg | ORAL_TABLET | Freq: Once | ORAL | Status: AC
  Administered 2015-04-24: 4 mg via ORAL
  Filled 2015-04-24: qty 1

## 2015-04-24 MED ORDER — PROMETHAZINE HCL 25 MG PO TABS: 25.0000 mg | ORAL_TABLET | Freq: Four times a day (QID) | ORAL | Status: DC | PRN

## 2015-04-24 MED ORDER — LOPERAMIDE HCL 2 MG PO TABS: 2.0000 mg | ORAL_TABLET | Freq: Four times a day (QID) | ORAL | Status: DC | PRN

## 2015-04-24 NOTE — Discharge Instructions (Signed)
Take the Phenergan as directed. And as needed for nausea and vomiting. Take Imodium right ear to help control the diarrhea. As we discussed return for any new or worse symptoms worse abdominal pain fevers vomiting blood or blood in the bowel movements. Or not improving over the next few days. Work note provided for today.

## 2015-04-24 NOTE — ED Notes (Signed)
Pt tolerated PO water without difficulty. Ginger ale given.

## 2015-04-24 NOTE — ED Provider Notes (Signed)
CSN: [id number]     Arrival date & time 04/24/15  1305 History   First MD Initiated Contact with Patient 04/24/15 1431     Chief Complaint  Patient presents with  . Abdominal Pain     (Consider location/radiation/quality/duration/timing/severity/associated sxs/prior Treatment) Patient is a 50 y.o. male presenting with abdominal pain. The history is provided by the patient.  Abdominal Pain Associated symptoms: diarrhea, nausea and vomiting   Associated symptoms: no chest pain, no dysuria, no fever and no shortness of breath    acute onset of nausea vomiting and diarrhea associated with crampy abdominal pain at midnight. No unusual food that wasn't eating by his spouse. No blood in the vomit or diarrhea. More episodes of diarrhea and vomiting. Associated abdominal cramping. No prior abdominal surgeries. Patient did go to work this morning but then started feeling worse at work and continued to have loose bowel movements.  Past Medical History  Diagnosis Date  . Hypertension   . Arthritis    History reviewed. No pertinent past surgical history. Family History  Problem Relation Age of Onset  . Healthy Sister   . Healthy Brother   . Healthy Sister   . Diabetes Other   . Osteoarthritis Other    Social History  Substance Use Topics  . Smoking status: Never Smoker   . Smokeless tobacco: Current User    Types: Chew  . Alcohol Use: 1.8 oz/week    3 Cans of beer per week     Comment: occasionally    Review of Systems  Constitutional: Negative for fever.  HENT: Negative for congestion.   Eyes: Negative for visual disturbance.  Respiratory: Negative for shortness of breath.   Cardiovascular: Negative for chest pain.  Gastrointestinal: Positive for nausea, vomiting, abdominal pain and diarrhea. Negative for blood in stool.  Genitourinary: Negative for dysuria.  Musculoskeletal: Negative for back pain.  Skin: Negative for rash.  Neurological: Negative for headaches.  Hematological:  Does not bruise/bleed easily.  Psychiatric/Behavioral: Negative for confusion.      Allergies  Review of patient's allergies indicates no known allergies.  Home Medications   Prior to Admission medications   Medication Sig Start Date End Date Taking? Authorizing Provider  acetaminophen-codeine (TYLENOL #3) 300-30 MG tablet Take 1-2 tablets by mouth every 6 (six) hours as needed for moderate pain. 01/15/15   Lily Kocher, PA-C  benazepril (LOTENSIN) 10 MG tablet TAKE ONE TABLET BY MOUTH DAILY. Patient not taking: Reported on 01/15/2015 01/17/14   Orlena Sheldon, PA-C  dexamethasone (DECADRON) 4 MG tablet Take 1 tablet (4 mg total) by mouth 2 (two) times daily with a meal. 01/15/15   Lily Kocher, PA-C  HYDROcodone-acetaminophen (NORCO/VICODIN) 5-325 MG per tablet Take one-two tabs po q 4-6 hrs prn pain Patient not taking: Reported on 01/15/2015 05/18/14   Tammy Triplett, PA-C  loperamide (IMODIUM A-D) 2 MG tablet Take 1 tablet (2 mg total) by mouth 4 (four) times daily as needed for diarrhea or loose stools. 04/24/15   Fredia Sorrow, MD  loratadine-pseudoephedrine (CLARITIN-D 12 HOUR) 5-120 MG per tablet 1 tablet each morning for congestion Patient not taking: Reported on 01/15/2015 06/22/14   Lily Kocher, PA-C  meloxicam (MOBIC) 15 MG tablet Take 1 tablet (15 mg total) by mouth daily. 01/15/15   Lily Kocher, PA-C  naproxen (NAPROSYN) 500 MG tablet Take 1 tablet (500 mg total) by mouth 2 (two) times daily with a meal. Patient not taking: Reported on 01/15/2015 05/18/14   Kem Parkinson, PA-C  predniSONE (DELTASONE) 10 MG tablet 5,4,3,2,1 - take with food Patient not taking: Reported on 01/15/2015 06/22/14   Lily Kocher, PA-C  promethazine (PHENERGAN) 25 MG tablet Take 1 tablet (25 mg total) by mouth every 6 (six) hours as needed. 04/24/15   Fredia Sorrow, MD   BP 146/94 mmHg  Pulse 91  Temp(Src) 99.3 F (37.4 C) (Oral)  Resp 16  Ht 6' (1.829 m)  Wt 92.987 kg  BMI 27.80 kg/m2  SpO2  100% Physical Exam  Constitutional: He is oriented to person, place, and time. He appears well-developed and well-nourished. No distress.  HENT:  Head: Normocephalic and atraumatic.  Mouth/Throat: Oropharynx is clear and moist.  Eyes: Conjunctivae and EOM are normal. Pupils are equal, round, and reactive to light.  Neck: Normal range of motion. Neck supple.  Cardiovascular: Normal rate, regular rhythm and normal heart sounds.   No murmur heard. Pulmonary/Chest: Effort normal and breath sounds normal. No respiratory distress.  Abdominal: Soft. Bowel sounds are normal. There is no tenderness.  Musculoskeletal: Normal range of motion.  Neurological: He is alert and oriented to person, place, and time. No cranial nerve deficit. He exhibits normal muscle tone. Coordination normal.  Skin: Skin is warm.  Nursing note and vitals reviewed.   ED Course  Procedures (including critical care time) Labs Review Labs Reviewed  COMPREHENSIVE METABOLIC PANEL - Abnormal; Notable for the following:    Potassium 3.4 (*)    Glucose, Bld 106 (*)    All other components within normal limits  CBC  LIPASE, BLOOD   Results for orders placed or performed during the hospital encounter of 04/24/15  Comprehensive metabolic panel  Result Value Ref Range   Sodium 137 135 - 145 mmol/L   Potassium 3.4 (L) 3.5 - 5.1 mmol/L   Chloride 102 101 - 111 mmol/L   CO2 24 22 - 32 mmol/L   Glucose, Bld 106 (H) 65 - 99 mg/dL   BUN 15 6 - 20 mg/dL   Creatinine, Ser 1.19 0.61 - 1.24 mg/dL   Calcium 8.9 8.9 - 10.3 mg/dL   Total Protein 8.1 6.5 - 8.1 g/dL   Albumin 4.3 3.5 - 5.0 g/dL   AST 26 15 - 41 U/L   ALT 20 17 - 63 U/L   Alkaline Phosphatase 56 38 - 126 U/L   Total Bilirubin 1.1 0.3 - 1.2 mg/dL   GFR calc non Af Amer >60 >60 mL/min   GFR calc Af Amer >60 >60 mL/min   Anion gap 11 5 - 15  CBC  Result Value Ref Range   WBC 5.1 4.0 - 10.5 K/uL   RBC 4.92 4.22 - 5.81 MIL/uL   Hemoglobin 15.5 13.0 - 17.0 g/dL    HCT 44.0 39.0 - 52.0 %   MCV 89.4 78.0 - 100.0 fL   MCH 31.5 26.0 - 34.0 pg   MCHC 35.2 30.0 - 36.0 g/dL   RDW 13.2 11.5 - 15.5 %   Platelets 188 150 - 400 K/uL  Lipase, blood  Result Value Ref Range   Lipase 39 11 - 51 U/L     Imaging Review No results found. I have personally reviewed and evaluated these images and lab results as part of my medical decision-making.   EKG Interpretation None      MDM   Final diagnoses:  Gastroenteritis    Patient with acute onset of a nausea vomiting and diarrhea illness at midnight associated with abdominal cramping. More diarrhea than vomiting. No unusual  food that was in [place] by his wife. Should doubt this is food poisoning. Most likely a viral gastroenteritis. Patient nontoxic no acute distress. Abdomen soft nontender. Abs without significant abnormalities. Patient will be treated symptomatically and will return for any new or worse symptoms. Precautions provided. No blood in either one.    Fredia Sorrow, MD 04/24/15 [phone]

## 2015-04-24 NOTE — ED Notes (Signed)
Pt reports vomiting/ diarrhea since this am. Pt reports intermittent sharp abd pain with BM.

## 2015-10-05 ENCOUNTER — Encounter (HOSPITAL_COMMUNITY): Payer: Self-pay | Admitting: Emergency Medicine

## 2015-10-05 ENCOUNTER — Emergency Department (HOSPITAL_COMMUNITY)
Admission: EM | Admit: 2015-10-05 | Discharge: 2015-10-05 | Disposition: A | Discharge status: Home / Self Care | Payer: Medicaid Other | Attending: Emergency Medicine | Admitting: Emergency Medicine

## 2015-10-05 ENCOUNTER — Emergency Department (HOSPITAL_COMMUNITY): Payer: Medicaid Other

## 2015-10-05 DIAGNOSIS — Y929 Unspecified place or not applicable: Secondary | ICD-10-CM | POA: Insufficient documentation

## 2015-10-05 DIAGNOSIS — Y9301 Activity, walking, marching and hiking: Secondary | ICD-10-CM | POA: Insufficient documentation

## 2015-10-05 DIAGNOSIS — S56812A Strain of other muscles, fascia and tendons at forearm level, left arm, initial encounter: Secondary | ICD-10-CM | POA: Insufficient documentation

## 2015-10-05 DIAGNOSIS — Y990 Civilian activity done for income or pay: Secondary | ICD-10-CM | POA: Insufficient documentation

## 2015-10-05 DIAGNOSIS — X500XXA Overexertion from strenuous movement or load, initial encounter: Secondary | ICD-10-CM | POA: Insufficient documentation

## 2015-10-05 DIAGNOSIS — F17220 Nicotine dependence, chewing tobacco, uncomplicated: Secondary | ICD-10-CM | POA: Insufficient documentation

## 2015-10-05 DIAGNOSIS — M1990 Unspecified osteoarthritis, unspecified site: Secondary | ICD-10-CM | POA: Insufficient documentation

## 2015-10-05 DIAGNOSIS — I10 Essential (primary) hypertension: Secondary | ICD-10-CM | POA: Insufficient documentation

## 2015-10-05 DIAGNOSIS — Z79899 Other long term (current) drug therapy: Secondary | ICD-10-CM | POA: Insufficient documentation

## 2015-10-05 DIAGNOSIS — S46912A Strain of unspecified muscle, fascia and tendon at shoulder and upper arm level, left arm, initial encounter: Secondary | ICD-10-CM

## 2015-10-05 NOTE — ED Notes (Signed)
Patient states jarring injury to left elbow. States he was on walk behind lawn mower and hit a stump, causing the mower to jerk back, jerking arm backwards. Unable to fully extend arm.

## 2015-10-05 NOTE — Discharge Instructions (Signed)
If you were given medicines take as directed.  If you are on coumadin or contraceptives realize their levels and effectiveness is altered by many different medicines.  If you have any reaction (rash, tongues swelling, other) to the medicines stop taking and see a physician.    If your blood pressure was elevated in the ER make sure you follow up for management with a primary doctor or return for chest pain, shortness of breath or stroke symptoms.  Please follow up as directed and return to the ER or see a physician for new or worsening symptoms.  Thank you. Filed Vitals:   10/05/15 0951  BP: 151/99  Pulse: 71  Temp: 97.8 F (36.6 C)  TempSrc: Oral  Resp: 18  Height: 6' (1.829 m)  Weight: 198 lb 2 oz (89.869 kg)  SpO2: 99%

## 2015-10-05 NOTE — ED Provider Notes (Signed)
CSN: [id number]     Arrival date & time 10/05/15  0940 History  By signing my name below, I, Reola Mosher, attest that this documentation has been prepared under the direction and in the presence of Elnora Morrison, MD. Electronically Signed: Reola Mosher, ED Scribe. 10/05/2015. 10:01 AM.   Chief Complaint  Patient presents with  . Arm Injury   The history is provided by the patient. No language interpreter was used.   HPI Comments: Corey Thomas is a 50 y.o. male with a PMHx of HTN and arthritis who presents to the Emergency Department complaining of sudden onset, gradually worsening, constant, 8/10 left elbow pain x 2 days. Pt reports that he was pushing a lawnmower at work when he hit a stump which caused him to jerk backwards. He notes that he was gripping the bar of the lawnmower very tightly at the time. No PSHx to the arm. His pain is worsened with extension of his joint and sleeping on the left side. No alleviating factors noted. He denies numbness, tingling, or any other symptoms.   Past Medical History  Diagnosis Date  . Hypertension   . Arthritis    History reviewed. No pertinent past surgical history. Family History  Problem Relation Age of Onset  . Healthy Sister   . Healthy Brother   . Healthy Sister   . Diabetes Other   . Osteoarthritis Other    Social History  Substance Use Topics  . Smoking status: Never Smoker   . Smokeless tobacco: Current User    Types: Chew  . Alcohol Use: 1.8 oz/week    3 Cans of beer per week     Comment: occasionally    Review of Systems  Constitutional: Negative for fever.  Musculoskeletal: Positive for myalgias and arthralgias.  Neurological: Negative for numbness.       Negative for tingling.   Allergies  Review of patient's allergies indicates no known allergies.  Home Medications   Prior to Admission medications   Medication Sig Start Date End Date Taking? Authorizing Provider  omeprazole (PRILOSEC) 20 MG  capsule Take 20 mg by mouth daily as needed (acid reflux).   Yes Historical Provider, MD  benazepril (LOTENSIN) 10 MG tablet TAKE ONE TABLET BY MOUTH DAILY. Patient not taking: Reported on 01/15/2015 01/17/14   Orlena Sheldon, PA-C  loperamide (IMODIUM A-D) 2 MG tablet Take 1 tablet (2 mg total) by mouth 4 (four) times daily as needed for diarrhea or loose stools. Patient not taking: Reported on 10/05/2015 04/24/15   Fredia Sorrow, MD  promethazine (PHENERGAN) 25 MG tablet Take 1 tablet (25 mg total) by mouth every 6 (six) hours as needed. Patient not taking: Reported on 10/05/2015 04/24/15   Fredia Sorrow, MD   BP 151/99 mmHg  Pulse 71  Temp(Src) 97.8 F (36.6 C) (Oral)  Resp 18  Ht 6' (1.829 m)  Wt 198 lb 2 oz (89.869 kg)  BMI 26.86 kg/m2  SpO2 99%   Physical Exam  Constitutional: He is oriented to person, place, and time. He appears well-developed and well-nourished.  HENT:  Head: Normocephalic.  Eyes: Conjunctivae are normal.  Cardiovascular: Normal rate.   Pulmonary/Chest: Effort normal. No respiratory distress.  Abdominal: He exhibits no distension.  Musculoskeletal:  No joint effusion or tenderness to the left AC or shoulder joint. No significant bony tenderness. Pain and decreased ROM with extension of the left elbow. Discomfort with supination, but normal strength and no pain with pronation. Discomfort is  at the anterior elbow joint and AC area. Compartments are all soft of examination.  Neurological: He is alert and oriented to person, place, and time.  Skin: Skin is warm and dry.  Psychiatric: He has a normal mood and affect. His behavior is normal.  Nursing note and vitals reviewed.  ED Course  Procedures (including critical care time)  DIAGNOSTIC STUDIES: Oxygen Saturation is 99% on RA, normal by my interpretation.   COORDINATION OF CARE: 10:00 AM-Discussed next steps with pt including DG left elbow. Pt verbalized understanding and is agreeable with the plan.    Imaging Review Dg Elbow Complete Left  10/05/2015  CLINICAL DATA:  Pain after jerking motion while mowing yard EXAM: LEFT ELBOW - COMPLETE 3+ VIEW COMPARISON:  None. FINDINGS: Frontal, lateral, and bilateral oblique views were obtained it. There is no acute fracture or dislocation. No joint effusion. There are spurs arising from the proximal ulna at several sites. There is no appreciable joint space narrowing. No erosive change. IMPRESSION: Spurs arising from the proximal ulna. No appreciable joint space narrowing. No demonstrable fracture or dislocation. Electronically Signed   By: Lowella Grip III M.D.   On: 10/05/2015 10:29   I have personally reviewed and evaluated these images as part of my medical decision-making.  MDM   Final diagnoses:  Elbow strain, left, initial encounter   Patient presents with left elbow tenderness and decreased ability to fully extend his left arm. Concern for tendon injury based on mechanism. Discussed outpatient follow-up with orthopedics. X-ray no fracture.  Results and differential diagnosis were discussed with the patient/parent/guardian. Xrays were independently reviewed by myself.  Close follow up outpatient was discussed, comfortable with the plan.   Medications - No data to display  Filed Vitals:   10/05/15 0951  BP: 151/99  Pulse: 71  Temp: 97.8 F (36.6 C)  TempSrc: Oral  Resp: 18  Height: 6' (1.829 m)  Weight: 198 lb 2 oz (89.869 kg)  SpO2: 99%    Final diagnoses:  Elbow strain, left, initial encounter      Elnora Morrison, MD 10/05/15 1102

## 2016-01-04 ENCOUNTER — Encounter (HOSPITAL_COMMUNITY): Payer: Self-pay

## 2016-01-04 ENCOUNTER — Emergency Department (HOSPITAL_COMMUNITY): Payer: Self-pay

## 2016-01-04 ENCOUNTER — Emergency Department (HOSPITAL_COMMUNITY)
Admission: EM | Admit: 2016-01-04 | Discharge: 2016-01-04 | Disposition: A | Discharge status: Home / Self Care | Payer: Self-pay | Attending: Emergency Medicine | Admitting: Emergency Medicine

## 2016-01-04 DIAGNOSIS — I10 Essential (primary) hypertension: Secondary | ICD-10-CM | POA: Insufficient documentation

## 2016-01-04 DIAGNOSIS — Z79899 Other long term (current) drug therapy: Secondary | ICD-10-CM | POA: Insufficient documentation

## 2016-01-04 DIAGNOSIS — M778 Other enthesopathies, not elsewhere classified: Secondary | ICD-10-CM | POA: Insufficient documentation

## 2016-01-04 DIAGNOSIS — F17220 Nicotine dependence, chewing tobacco, uncomplicated: Secondary | ICD-10-CM | POA: Insufficient documentation

## 2016-01-04 MED ORDER — IBUPROFEN 800 MG PO TABS: 800.0000 mg | ORAL_TABLET | Freq: Three times a day (TID) | ORAL | 0 refills | Status: DC

## 2016-01-04 NOTE — ED Provider Notes (Signed)
[facility] DEPT Provider Note   CSN: [id number] Arrival date & time: 01/04/16  0758     History   Chief Complaint Chief Complaint  Patient presents with  . Wrist Pain    HPI Corey Thomas is a 50 y.o. male.  The history is provided by the patient. No language interpreter was used.  Wrist Pain  This is a new problem. The current episode started 3 to 5 hours ago. The problem occurs constantly. The problem has been gradually worsening. Nothing aggravates the symptoms. Nothing relieves the symptoms. He has tried nothing for the symptoms. The treatment provided no relief.  Pt reports he awoke with pain in his left wrist.  Pt complains of pain with movement.  No other complaint no numbness no arm or chest pain  Past Medical History:  Diagnosis Date  . Arthritis   . Hypertension     Patient Active Problem List   Diagnosis Date Noted  . Arthritis of left hip 05/13/2014  . Pain in limb 12/10/2013  . Essential hypertension, benign 10/14/2013    History reviewed. No pertinent surgical history.     Home Medications    Prior to Admission medications   Medication Sig Start Date End Date Taking? Authorizing Provider  benazepril (LOTENSIN) 10 MG tablet TAKE ONE TABLET BY MOUTH DAILY. Patient not taking: Reported on 01/15/2015 01/17/14   Orlena Sheldon, PA-C  loperamide (IMODIUM A-D) 2 MG tablet Take 1 tablet (2 mg total) by mouth 4 (four) times daily as needed for diarrhea or loose stools. Patient not taking: Reported on 10/05/2015 04/24/15   Fredia Sorrow, MD  omeprazole (PRILOSEC) 20 MG capsule Take 20 mg by mouth daily as needed (acid reflux).    Historical Provider, MD  promethazine (PHENERGAN) 25 MG tablet Take 1 tablet (25 mg total) by mouth every 6 (six) hours as needed. Patient not taking: Reported on 10/05/2015 04/24/15   Fredia Sorrow, MD    Family History Family History  Problem Relation Age of Onset  . Healthy Sister   . Healthy Brother   . Healthy Sister     . Diabetes Other   . Osteoarthritis Other     Social History Social History  Substance Use Topics  . Smoking status: Never Smoker  . Smokeless tobacco: Current User    Types: Chew  . Alcohol use 1.8 oz/week    3 Cans of beer per week     Comment: occasionally     Allergies   Review of patient's allergies indicates no known allergies.   Review of Systems Review of Systems  All other systems reviewed and are negative.    Physical Exam Updated Vital Signs BP (!) 162/113   Pulse 71   Temp 98.3 F (36.8 C) (Oral)   Resp 18   Ht 6' (1.829 m)   Wt 93 kg   SpO2 100%   BMI 27.80 kg/m   Physical Exam  Constitutional: He is oriented to person, place, and time.  Musculoskeletal: He exhibits tenderness.  Tenderness left wrist, pain with movement, decreased range of motion,  nv and ns intact  Neurological: He is alert and oriented to person, place, and time.  Skin: Skin is warm.  Psychiatric: He has a normal mood and affect.  Nursing note and vitals reviewed.    ED Treatments / Results  Labs (all labs ordered are listed, but only abnormal results are displayed) Labs Reviewed - No data to display  EKG  EKG Interpretation None  Radiology Dg Wrist Complete Left  Result Date: 01/04/2016 CLINICAL DATA:  Left wrist pain EXAM: LEFT WRIST - COMPLETE 3+ VIEW COMPARISON:  05/18/2014 FINDINGS: Four views of the left wrist submitted. No acute fracture or subluxation. Mild narrowing of radiocarpal joint space. Mild negative ulnar variance. IMPRESSION: No acute fracture or subluxation.  Mild negative ulnar variance. Electronically Signed   By: Lahoma Crocker M.D.   On: 01/04/2016 09:14    Procedures Procedures (including critical care time)  Medications Ordered in ED Medications - No data to display   Initial Impression / Assessment and Plan / ED Course  I have reviewed the triage vital signs and the nursing notes.  Pertinent labs & imaging results that were  available during my care of the patient were reviewed by me and considered in my medical decision making (see chart for details).  Clinical Course    Pt placed in a wrist splint. Ibuprofen for inflammation Follow up with Dr. Aline Brochure for recheck.  Call for appointment  Final Clinical Impressions(s) / ED Diagnoses   Final diagnoses:  Left wrist tendonitis    New Prescriptions New Prescriptions   IBUPROFEN (ADVIL,MOTRIN) 800 MG TABLET    Take 1 tablet (800 mg total) by mouth 3 (three) times daily.  An After Visit Summary was printed and given to the patient.   Hollace Kinnier Ripley, PA-C 01/04/16 [place], MD 01/04/16 [phone]

## 2016-01-04 NOTE — ED Triage Notes (Signed)
Patient states he woke up this morning and his wrist was hurting - no injury reported

## 2016-04-03 ENCOUNTER — Encounter (HOSPITAL_COMMUNITY): Payer: Self-pay | Admitting: Emergency Medicine

## 2016-04-03 ENCOUNTER — Emergency Department (HOSPITAL_COMMUNITY)
Admission: EM | Admit: 2016-04-03 | Discharge: 2016-04-03 | Disposition: A | Discharge status: Home / Self Care | Payer: BLUE CROSS/BLUE SHIELD | Attending: Emergency Medicine | Admitting: Emergency Medicine

## 2016-04-03 DIAGNOSIS — K602 Anal fissure, unspecified: Secondary | ICD-10-CM | POA: Diagnosis not present

## 2016-04-03 DIAGNOSIS — F17220 Nicotine dependence, chewing tobacco, uncomplicated: Secondary | ICD-10-CM | POA: Insufficient documentation

## 2016-04-03 DIAGNOSIS — I10 Essential (primary) hypertension: Secondary | ICD-10-CM | POA: Insufficient documentation

## 2016-04-03 DIAGNOSIS — K625 Hemorrhage of anus and rectum: Secondary | ICD-10-CM | POA: Diagnosis present

## 2016-04-03 NOTE — ED Triage Notes (Signed)
Patient c/o rectal bleeding that started yesterday with BMs. Per patient has noted red blood in toilet after BMs x2. Denies any abd pain. Patient unsure if he has been straining to have BM.

## 2016-04-03 NOTE — Discharge Instructions (Signed)
Continue to take your usual prescriptions as previously directed.  Begin to take over the counter fiber products (ie: Citucel, Metamucil) or stool softener (colace), as directed on packaging, for the next month. Sit in a warm water tub several times per day for the next week.  Call your regular medical doctor tomorrow to schedule a follow up appointment this week.  Return to the Emergency Department immediately if worsening.

## 2016-04-03 NOTE — ED Notes (Signed)
This nurse present with Dr. Thurnell Garbe for rectal exam

## 2016-04-03 NOTE — ED Provider Notes (Signed)
[facility] DEPT Provider Note   CSN: [id number] Arrival date & time: 04/03/16  1359     History   Chief Complaint Chief Complaint  Patient presents with  . Rectal Bleeding    HPI Corey Thomas is a 51 y.o. male.  HPI  Pt was seen at 1450. Per pt, c/o sudden onset and resolution of several episodes of rectal bleeding since yesterday. Pt states his symptoms began yesterday after he was "straining" to have a BM, then passed a large, hard stool. Pt describes the bleeding as seeing red blood on the toilet paper and in the toilet bowl. Denies abd pain, no rectal discharge, no back pain, no fevers, no black or bloody stools.   Past Medical History:  Diagnosis Date  . Arthritis   . Hypertension     Patient Active Problem List   Diagnosis Date Noted  . Arthritis of left hip 05/13/2014  . Pain in limb 12/10/2013  . Essential hypertension, benign 10/14/2013    History reviewed. No pertinent surgical history.     Home Medications    Prior to Admission medications   Medication Sig Start Date End Date Taking? Authorizing Provider  ibuprofen (ADVIL,MOTRIN) 800 MG tablet Take 1 tablet (800 mg total) by mouth 3 (three) times daily. 01/04/16   Fransico Meadow, PA-C    Family History Family History  Problem Relation Age of Onset  . Healthy Sister   . Healthy Brother   . Healthy Sister   . Diabetes Other   . Osteoarthritis Other     Social History Social History  Substance Use Topics  . Smoking status: Never Smoker  . Smokeless tobacco: Current User    Types: Chew  . Alcohol use 1.8 oz/week    3 Cans of beer per week     Comment: occasionally     Allergies   Patient has no known allergies.   Review of Systems Review of Systems ROS: Statement: All systems negative except as marked or noted in the HPI; Constitutional: Negative for fever and chills. ; ; Eyes: Negative for eye pain, redness and discharge. ; ; ENMT: Negative for ear pain, hoarseness, nasal  congestion, sinus pressure and sore throat. ; ; Cardiovascular: Negative for chest pain, palpitations, diaphoresis, dyspnea and peripheral edema. ; ; Respiratory: Negative for cough, wheezing and stridor. ; ; Gastrointestinal: Negative for nausea, vomiting, diarrhea, abdominal pain, hematemesis, jaundice and +rectal bleeding. . ; ; Genitourinary: Negative for dysuria, flank pain and hematuria. ; ; Musculoskeletal: Negative for back pain and neck pain. Negative for swelling and trauma.; ; Skin: Negative for pruritus, rash, abrasions, blisters, bruising and skin lesion.; ; Neuro: Negative for headache, lightheadedness and neck stiffness. Negative for weakness, altered level of consciousness, altered mental status, extremity weakness, paresthesias, involuntary movement, seizure and syncope.      Physical Exam Updated Vital Signs BP 155/90 (BP Location: Left Arm)   Pulse 80   Temp 98.1 F (36.7 C) (Oral)   Resp 18   Ht 6' (1.829 m)   Wt 205 lb (93 kg)   SpO2 97%   BMI 27.80 kg/m   Physical Exam 1455: Physical examination:  Nursing notes reviewed; Vital signs and O2 SAT reviewed;  Constitutional: Well developed, Well nourished, Well hydrated, In no acute distress; Head:  Normocephalic, atraumatic; Eyes: EOMI, PERRL, No scleral icterus; ENMT: Mouth and pharynx normal, Mucous membranes moist; Neck: Supple, Full range of motion, No lymphadenopathy; Cardiovascular: Regular rate and rhythm, No murmur, rub, or gallop;  Respiratory: Breath sounds clear & equal bilaterally, No rales, rhonchi, wheezes.  Speaking full sentences with ease, Normal respiratory effort/excursion; Chest: Nontender, Movement normal; Abdomen: Soft, Nontender, Nondistended, Normal bowel sounds. Rectal exam performed w/permission of pt and ED RN chaperone present.  Anal tone normal. +rectal fissure without active bleeding. No external hemorrhoids, no palp masses.;;; Genitourinary: No CVA tenderness; Extremities: Pulses normal, No  tenderness, No edema, No calf edema or asymmetry.; Neuro: AA&Ox3, Major CN grossly intact.  Speech clear. No gross focal motor or sensory deficits in extremities.; Skin: Color normal, Warm, Dry.   ED Treatments / Results  Labs (all labs ordered are listed, but only abnormal results are displayed)   EKG  EKG Interpretation None       Radiology   Procedures Procedures (including critical care time)  Medications Ordered in ED Medications - No data to display   Initial Impression / Assessment and Plan / ED Course  I have reviewed the triage vital signs and the nursing notes.  Pertinent labs & imaging results that were available during my care of the patient were reviewed by me and considered in my medical decision making (see chart for details).  MDM Reviewed: previous chart, nursing note and vitals    1500:  Normal CBC and CMP last year. Tx symptomatically for rectal fissure at this time. Dx d/w pt.  Questions answered.  Verb understanding, agreeable to d/c home with outpt f/u.   Final Clinical Impressions(s) / ED Diagnoses   Final diagnoses:  None    New Prescriptions New Prescriptions   No medications on file     Francine Graven, DO 04/06/16 [id number]

## 2016-04-03 NOTE — ED Notes (Addendum)
Pt reports that he drinks a six pack daily  Noticed today while defecating that he bled bright blood as he strained to "have a poop"  "didn't want to take a chance and came to get checked out"  Denies abd pain

## 2016-04-24 ENCOUNTER — Emergency Department (HOSPITAL_COMMUNITY)
Admission: EM | Admit: 2016-04-24 | Discharge: 2016-04-24 | Disposition: A | Discharge status: Home Health | Payer: BLUE CROSS/BLUE SHIELD | Attending: Emergency Medicine | Admitting: Emergency Medicine

## 2016-04-24 ENCOUNTER — Encounter (HOSPITAL_COMMUNITY): Payer: Self-pay | Admitting: Emergency Medicine

## 2016-04-24 DIAGNOSIS — I10 Essential (primary) hypertension: Secondary | ICD-10-CM | POA: Insufficient documentation

## 2016-04-24 DIAGNOSIS — Z791 Long term (current) use of non-steroidal anti-inflammatories (NSAID): Secondary | ICD-10-CM | POA: Insufficient documentation

## 2016-04-24 DIAGNOSIS — F17220 Nicotine dependence, chewing tobacco, uncomplicated: Secondary | ICD-10-CM | POA: Insufficient documentation

## 2016-04-24 DIAGNOSIS — R1013 Epigastric pain: Secondary | ICD-10-CM

## 2016-04-24 LAB — CBC WITH DIFFERENTIAL/PLATELET
BASOS PCT: 0 %
Basophils Absolute: 0 10*3/uL (ref 0.0–0.1)
EOS ABS: 0.1 10*3/uL (ref 0.0–0.7)
EOS PCT: 4 %
HCT: 43.2 % (ref 39.0–52.0)
HEMOGLOBIN: 15.6 g/dL (ref 13.0–17.0)
Lymphocytes Relative: 43 %
Lymphs Abs: 1.5 10*3/uL (ref 0.7–4.0)
MCH: 32.2 pg (ref 26.0–34.0)
MCHC: 36.1 g/dL — ABNORMAL HIGH (ref 30.0–36.0)
MCV: 89.3 fL (ref 78.0–100.0)
Monocytes Absolute: 0.4 10*3/uL (ref 0.1–1.0)
Monocytes Relative: 11 %
NEUTROS PCT: 42 %
Neutro Abs: 1.4 10*3/uL — ABNORMAL LOW (ref 1.7–7.7)
PLATELETS: 190 10*3/uL (ref 150–400)
RBC: 4.84 MIL/uL (ref 4.22–5.81)
RDW: 12.9 % (ref 11.5–15.5)
WBC: 3.4 10*3/uL — AB (ref 4.0–10.5)

## 2016-04-24 LAB — COMPREHENSIVE METABOLIC PANEL
ALBUMIN: 4.1 g/dL (ref 3.5–5.0)
ALK PHOS: 49 U/L (ref 38–126)
ALT: 24 U/L (ref 17–63)
ANION GAP: 8 (ref 5–15)
AST: 22 U/L (ref 15–41)
BUN: 11 mg/dL (ref 6–20)
CALCIUM: 9.2 mg/dL (ref 8.9–10.3)
CHLORIDE: 106 mmol/L (ref 101–111)
CO2: 26 mmol/L (ref 22–32)
CREATININE: 1.1 mg/dL (ref 0.61–1.24)
GFR calc non Af Amer: >60 mL/min (ref >60)
GLUCOSE: 96 mg/dL (ref 65–99)
Potassium: 3.9 mmol/L (ref 3.5–5.1)
SODIUM: 140 mmol/L (ref 135–145)
Total Bilirubin: 0.6 mg/dL (ref 0.3–1.2)
Total Protein: 7.6 g/dL (ref 6.5–8.1)

## 2016-04-24 LAB — LIPASE, BLOOD: Lipase: 29 U/L (ref 11–51)

## 2016-04-24 MED ORDER — SIMETHICONE 80 MG PO CHEW: 80.0000 mg | CHEWABLE_TABLET | Freq: Four times a day (QID) | ORAL | 0 refills | Status: DC | PRN

## 2016-04-24 MED ORDER — FAMOTIDINE 20 MG PO TABS: 20.0000 mg | ORAL_TABLET | Freq: Two times a day (BID) | ORAL | 1 refills | Status: DC

## 2016-04-24 MED ORDER — GI COCKTAIL ~~LOC~~
30.0000 mL | Freq: Once | ORAL | Status: AC
  Administered 2016-04-24: 30 mL via ORAL
  Filled 2016-04-24: qty 30

## 2016-04-24 NOTE — ED Triage Notes (Signed)
Patient c/o sharp, intermittent abd pain. Per patient generalized. Denies any nausea, vomiting, or diarrhea. Patient reports pain x1 month in which he states he was seen here 3 weeks ago for and some rectal bleeding. Patient told rectal fissure. Denies any relief. Per patient saw some bright red blood yesterday in stool.

## 2016-04-24 NOTE — ED Notes (Signed)
Removed IV

## 2016-04-24 NOTE — Discharge Instructions (Signed)
Mr. Samaniego  Please follow up with your primary care doctor for repeat blood work and for your abdominal pain.  Please start taking famotidine and simethicone to help with you abdominal pain.

## 2016-04-24 NOTE — ED Provider Notes (Signed)
[facility] DEPT Provider Note   CSN: [id number] Arrival date & time: 04/24/16  0935     History   Chief Complaint Chief Complaint  Patient presents with  . Abdominal Pain    HPI Corey NOGUEZ is a 51 y.o. male. With history of arthritis and hypertension that presents to the ED for abdominal pain.  He reports abdominal pain that comes and goes for the past month.  The pain becomes worse with eating or drinking beer.  He describes the pain as stabbing and localized to the epigastric region.  At times the pain migrates to the sides of the lower abdomin.  He reports the pain lasts for 10-63min and he has several episodes of abdominal pain every day.  He has associated symptoms of bloating. He denies fever, chills, nausea, vomiting, or diarrhea.  He denies changes in his bowel movements.     HPI  Past Medical History:  Diagnosis Date  . Arthritis   . Hypertension     Patient Active Problem List   Diagnosis Date Noted  . Arthritis of left hip 05/13/2014  . Pain in limb 12/10/2013  . Essential hypertension, benign 10/14/2013    History reviewed. No pertinent surgical history.     Home Medications    Prior to Admission medications   Medication Sig Start Date End Date Taking? Authorizing Provider  ibuprofen (ADVIL,MOTRIN) 800 MG tablet Take 1 tablet (800 mg total) by mouth 3 (three) times daily. 01/04/16  Yes Hollace Kinnier Sofia, PA-C  famotidine (PEPCID) 20 MG tablet Take 1 tablet (20 mg total) by mouth 2 (two) times daily. 04/24/16   Valinda Party, DO  simethicone (MYLICON) 80 MG chewable tablet Chew 1 tablet (80 mg total) by mouth every 6 (six) hours as needed for flatulence. 04/24/16   Valinda Party, DO    Family History Family History  Problem Relation Age of Onset  . Healthy Sister   . Healthy Brother   . Healthy Sister   . Diabetes Other   . Osteoarthritis Other     Social History Social History  Substance Use Topics  . Smoking status: Never  Smoker  . Smokeless tobacco: Current User    Types: Chew  . Alcohol use 1.8 oz/week    3 Cans of beer per week     Comment: occasionally     Allergies   Patient has no known allergies.   Review of Systems Review of Systems  Constitutional: Negative for appetite change.  Respiratory: Negative for cough.   Cardiovascular: Negative for chest pain.  Gastrointestinal: Positive for abdominal distention and abdominal pain. Negative for diarrhea and nausea.  Musculoskeletal: Negative for arthralgias.     Physical Exam Updated Vital Signs BP 159/93 (BP Location: Left Arm)   Pulse 68   Temp 98 F (36.7 C) (Oral)   Resp 20   Ht 6' (1.829 m)   Wt 93 kg   SpO2 98%   BMI 27.80 kg/m   Physical Exam  Constitutional: He appears well-developed and well-nourished. No distress.  Cardiovascular: Normal rate, regular rhythm and normal heart sounds.  Exam reveals no gallop and no friction rub.   No murmur heard. Pulmonary/Chest: Effort normal and breath sounds normal. No respiratory distress. He has no wheezes. He has no rales.  Abdominal: Soft. Bowel sounds are normal. He exhibits no distension and no mass. There is no rebound and no guarding.  Tenderness in epigastric region Negative murphy's sign   Musculoskeletal: He exhibits no  edema.     ED Treatments / Results  Labs (all labs ordered are listed, but only abnormal results are displayed) Labs Reviewed  CBC WITH DIFFERENTIAL/PLATELET - Abnormal; Notable for the following:       Result Value   WBC 3.4 (*)    MCHC 36.1 (*)    Neutro Abs 1.4 (*)    All other components within normal limits  COMPREHENSIVE METABOLIC PANEL  LIPASE, BLOOD    EKG  EKG Interpretation None       Radiology No results found.  Procedures Procedures (including critical care time)  Medications Ordered in ED Medications  gi cocktail (Maalox,Lidocaine,Donnatal) (30 mLs Oral Given 04/24/16 1144)     Initial Impression / Assessment and Plan  / ED Course  I have reviewed the triage vital signs and the nursing notes.  Pertinent labs & imaging results that were available during my care of the patient were reviewed by me and considered in my medical decision making (see chart for details).   Patient has had ongoing epigastric pain for the past 4 weeks with associated symptoms of bloating.  Patient is afebrile with slightly low WBC.  RUQ of abdomen did not reveal any choledocholithiasis.  Patient received a GI cocktail with improvement in symptoms.  Gave patient a prescription for famotidine and simethicone.  Encouraged patient to follow up with PCP for repeat blood work and follow up on abdominal pain.         Final Clinical Impressions(s) / ED Diagnoses   Final diagnoses:  Epigastric pain    New Prescriptions New Prescriptions   FAMOTIDINE (PEPCID) 20 MG TABLET    Take 1 tablet (20 mg total) by mouth 2 (two) times daily.   SIMETHICONE (MYLICON) 80 MG CHEWABLE TABLET    Chew 1 tablet (80 mg total) by mouth every 6 (six) hours as needed for flatulence.     [place], DO 04/24/16 1314    Elnora Morrison, MD 04/24/16 [phone]

## 2016-06-08 ENCOUNTER — Ambulatory Visit (INDEPENDENT_AMBULATORY_CARE_PROVIDER_SITE_OTHER): Payer: BLUE CROSS/BLUE SHIELD | Admitting: Family Medicine

## 2016-06-08 ENCOUNTER — Encounter: Payer: Self-pay | Admitting: Family Medicine

## 2016-06-08 VITALS — BP 154/100 | HR 72 | Temp 97.5°F | Resp 16 | Ht 72.0 in | Wt 200.0 lb

## 2016-06-08 DIAGNOSIS — Z1211 Encounter for screening for malignant neoplasm of colon: Secondary | ICD-10-CM | POA: Diagnosis not present

## 2016-06-08 DIAGNOSIS — H539 Unspecified visual disturbance: Secondary | ICD-10-CM | POA: Diagnosis not present

## 2016-06-08 DIAGNOSIS — K219 Gastro-esophageal reflux disease without esophagitis: Secondary | ICD-10-CM | POA: Insufficient documentation

## 2016-06-08 DIAGNOSIS — F1090 Alcohol use, unspecified, uncomplicated: Secondary | ICD-10-CM | POA: Insufficient documentation

## 2016-06-08 DIAGNOSIS — Z7689 Persons encountering health services in other specified circumstances: Secondary | ICD-10-CM

## 2016-06-08 DIAGNOSIS — Z91148 Patient's other noncompliance with medication regimen for other reason: Secondary | ICD-10-CM | POA: Insufficient documentation

## 2016-06-08 DIAGNOSIS — Z7289 Other problems related to lifestyle: Secondary | ICD-10-CM

## 2016-06-08 DIAGNOSIS — Z720 Tobacco use: Secondary | ICD-10-CM | POA: Insufficient documentation

## 2016-06-08 DIAGNOSIS — Z789 Other specified health status: Secondary | ICD-10-CM | POA: Diagnosis not present

## 2016-06-08 DIAGNOSIS — I10 Essential (primary) hypertension: Secondary | ICD-10-CM | POA: Diagnosis not present

## 2016-06-08 DIAGNOSIS — Z9114 Patient's other noncompliance with medication regimen: Secondary | ICD-10-CM

## 2016-06-08 HISTORY — DX: Gastro-esophageal reflux disease without esophagitis: K21.9

## 2016-06-08 LAB — LIPID PANEL
CHOL/HDL RATIO: 3.7 ratio (ref <5.0)
CHOLESTEROL: 182 mg/dL (ref <200)
HDL: 49 mg/dL (ref >40)
LDL Cholesterol: 117 mg/dL — ABNORMAL HIGH (ref <100)
Triglycerides: 79 mg/dL (ref <150)
VLDL: 16 mg/dL (ref <30)

## 2016-06-08 MED ORDER — BENAZEPRIL HCL 10 MG PO TABS: 10.0000 mg | ORAL_TABLET | Freq: Every day | ORAL | 3 refills | Status: DC

## 2016-06-08 MED ORDER — PANTOPRAZOLE SODIUM 40 MG PO TBEC: 40.0000 mg | DELAYED_RELEASE_TABLET | Freq: Every day | ORAL | 3 refills | Status: DC

## 2016-06-08 NOTE — Patient Instructions (Signed)
Take the blood pressure pill and the acid reflux pill daily Once a day every morning Come back for a BP check in 2 weeks Need additional lab tests Stop the tobacco See me in a month Referred for colon cancer screening

## 2016-06-08 NOTE — Progress Notes (Signed)
Chief Complaint  Patient presents with  . Establish Care    htn   Prior diagnosis hypertension Took meds for over a year then stopped for no reason except that he did not like taking pills. 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 Feels well Normal diet - unrestricted.  Drinks too many sodas ans sweet drinks, per wife "likes his salt". 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 Noncompliance with medications 06/08/2016  . Heavy alcohol consumption 06/08/2016  . Tobacco dipper 06/08/2016  . GERD without esophagitis 06/08/2016  . Monocular visual disturbance 06/08/2016  . Arthritis of left hip 05/13/2014  . Essential hypertension, benign 10/14/2013    Outpatient Encounter Prescriptions as of 06/08/2016  Medication Sig  . simethicone (MYLICON) 80 MG chewable tablet Chew 1 tablet (80 mg total) by mouth every 6 (six) hours as needed for flatulence.  . benazepril (LOTENSIN) 10 MG tablet Take 1 tablet (10 mg total) by mouth daily.  . pantoprazole (PROTONIX) 40 MG tablet Take 1 tablet (40 mg total) by mouth daily.   No facility-administered encounter medications on file as of 06/08/2016.     Past Medical History:  Diagnosis Date  . Arthritis   . GERD without esophagitis 06/08/2016  . Hypertension     History reviewed. No pertinent surgical history.  Social History   Social History  . Marital status: Married    Spouse name: N/A  . Number of children: 2  . Years of education: 11   Occupational History  . CREW MEMBER [employer]   Social History Main Topics  . Smoking status: Never Smoker  . Smokeless tobacco: Current User    Types: Snuff  . Alcohol use 1.8 oz/week    3 Cans of beer per week     Comment: 3-4 cans   . Drug use: No  . Sexual activity: Yes    Birth control/ protection: None   Other Topics  Concern  . Not on file   Social History Narrative   Lives with wife and 59 year old step child    Family History  Problem Relation Age of Onset  . Healthy Sister   . Healthy Sister   . Alcohol abuse Brother 41    cirrhosis  . Diabetes Other   . Osteoarthritis Other   . Arthritis Maternal Grandmother     Review of Systems  Constitutional: Negative for chills, fever and weight loss.  HENT: Negative for congestion and hearing loss.   Eyes: Negative.  Negative for blurred vision and pain.       Blind one eye  Respiratory: Negative for cough and shortness of breath.   Cardiovascular: Negative for chest pain and leg swelling.  Gastrointestinal: Positive for heartburn. Negative for abdominal pain, constipation and diarrhea.  Genitourinary: Negative for dysuria and frequency.  Musculoskeletal: Negative for falls, joint pain and myalgias.  Neurological: Negative for dizziness, seizures and headaches.  Psychiatric/Behavioral: Negative for depression. The patient is not nervous/anxious and does not have insomnia.    BP (!) 154/100 (BP Location: Right Arm, Patient Position: Sitting, Cuff Size: Large)   Pulse 72   Temp 97.5 F (36.4 C) (Temporal)   Resp 16   Ht 6' (1.829 m)   Wt 200 lb 0.6 oz (90.7 kg)   SpO2 99%   BMI  27.13 kg/m   Physical Exam  Constitutional: He is oriented to person, place, and time. He appears well-developed and well-nourished.  HENT:  Head: Normocephalic and atraumatic.  Right Ear: External ear normal.  Left Ear: External ear normal.  Mouth/Throat: Oropharynx is clear and moist.  Eyes: Conjunctivae are normal. Pupils are equal, round, and reactive to light.  Discs flat  Neck: Normal range of motion. Neck supple. No thyromegaly present.  Cardiovascular: Normal rate, regular rhythm and normal heart sounds.   Pulmonary/Chest: Effort normal and breath sounds normal. No respiratory distress.  Abdominal: Soft. Bowel sounds are normal. There is tenderness.    Mild tender epigastrium  Musculoskeletal: Normal range of motion. He exhibits no edema.  Lymphadenopathy:    He has no cervical adenopathy.  Neurological: He is alert and oriented to person, place, and time.  Gait normal  Skin: Skin is warm and dry.  Psychiatric: He has a normal mood and affect. His behavior is normal. Thought content normal.  Nursing note and vitals reviewed.  ASSESSMENT/PLAN:  1. Encounter to establish care with new doctor  2. Essential hypertension  - Lipid panel - Hemoglobin A1c - Urinalysis, Routine w reflex microscopic - VITAMIN D 25 Hydroxy (Vit-D Deficiency, Fractures)  3. GERD without esophagitis  4. Screen for colon cancer  - Ambulatory referral to Gastroenterology  5. Noncompliance with medications Discussed importance of treating hypertension to prevent cardiovascular complications  6. Heavy alcohol consumption  7. Tobacco dipper    Patient Instructions  Take the blood pressure pill and the acid reflux pill daily Once a day every morning Come back for a BP check in 2 weeks Need additional lab tests Stop the tobacco See me in a month Referred for colon cancer screening   Raylene Everts, MD

## 2016-06-09 ENCOUNTER — Encounter: Payer: Self-pay | Admitting: Family Medicine

## 2016-06-09 DIAGNOSIS — E559 Vitamin D deficiency, unspecified: Secondary | ICD-10-CM

## 2016-06-09 LAB — URINALYSIS, ROUTINE W REFLEX MICROSCOPIC
Bilirubin Urine: NEGATIVE
GLUCOSE, UA: NEGATIVE
HGB URINE DIPSTICK: NEGATIVE
KETONES UR: NEGATIVE
LEUKOCYTES UA: NEGATIVE
Nitrite: NEGATIVE
PH: 5.5 (ref 5.0–8.0)
Protein, ur: NEGATIVE
Specific Gravity, Urine: 1.021 (ref 1.001–1.035)

## 2016-06-09 LAB — HEMOGLOBIN A1C
Hgb A1c MFr Bld: 5.1 % (ref <5.7)
Mean Plasma Glucose: 100 mg/dL

## 2016-06-09 LAB — VITAMIN D 25 HYDROXY (VIT D DEFICIENCY, FRACTURES): VIT D 25 HYDROXY: 10 ng/mL — AB (ref 30–100)

## 2016-06-09 MED ORDER — VITAMIN D (ERGOCALCIFEROL) 1.25 MG (50000 UNIT) PO CAPS: 50000.0000 [IU] | ORAL_CAPSULE | ORAL | 0 refills | Status: DC

## 2016-06-15 ENCOUNTER — Telehealth: Payer: Self-pay | Admitting: Family Medicine

## 2016-06-15 NOTE — Telephone Encounter (Signed)
Patients spouse called to let you know that his blood pressure was checked this morning at [facility], the nurse there said he may need stronger medication because his blood pressure is high, spoke with you and patient will be here tomorrow at 4pm for a nurse visit

## 2016-06-15 NOTE — Telephone Encounter (Signed)
FYI

## 2016-06-16 ENCOUNTER — Telehealth: Payer: Self-pay

## 2016-06-16 ENCOUNTER — Ambulatory Visit: Payer: BLUE CROSS/BLUE SHIELD

## 2016-06-16 VITALS — BP 156/110

## 2016-06-16 DIAGNOSIS — K219 Gastro-esophageal reflux disease without esophagitis: Secondary | ICD-10-CM

## 2016-06-16 DIAGNOSIS — I10 Essential (primary) hypertension: Secondary | ICD-10-CM

## 2016-06-16 NOTE — Telephone Encounter (Signed)
Pt in to have b/p ck = 156 110 Is taking benazepril 10 mg po daily. Per dr Meda Coffee, advised to increase to 20 mg po daily. Cont to ck b/p at home, and to come to office for b/p ck Monday.

## 2016-06-22 ENCOUNTER — Telehealth: Payer: Self-pay | Admitting: Family Medicine

## 2016-06-22 NOTE — Telephone Encounter (Signed)
Corey Thomas, [place] wife is calling asking if Dr. Meda Coffee could see him sooner than 07/15/16 for a DOT Physical for his job, Please advise?

## 2016-06-22 NOTE — Telephone Encounter (Signed)
Are you doing DOT physicals here?

## 2016-06-24 ENCOUNTER — Encounter (INDEPENDENT_AMBULATORY_CARE_PROVIDER_SITE_OTHER): Payer: Self-pay | Admitting: Internal Medicine

## 2016-06-28 NOTE — Telephone Encounter (Signed)
NO

## 2016-06-29 ENCOUNTER — Other Ambulatory Visit: Payer: Self-pay

## 2016-06-29 MED ORDER — BENAZEPRIL HCL 10 MG PO TABS: 20.0000 mg | ORAL_TABLET | Freq: Every day | ORAL | 3 refills | Status: DC

## 2016-06-29 NOTE — Telephone Encounter (Signed)
[name] is calling asking if Dr. Meda Coffee would call in [place] a new Rx for his Blood Pressure Medication since the dosage was changed, please advise?

## 2016-06-30 NOTE — Telephone Encounter (Signed)
Left message to call office; new rx sent to pharmacy yesterday.

## 2016-07-04 ENCOUNTER — Encounter (INDEPENDENT_AMBULATORY_CARE_PROVIDER_SITE_OTHER): Payer: Self-pay | Admitting: Internal Medicine

## 2016-07-04 ENCOUNTER — Encounter (INDEPENDENT_AMBULATORY_CARE_PROVIDER_SITE_OTHER): Payer: Self-pay | Admitting: *Deleted

## 2016-07-04 ENCOUNTER — Ambulatory Visit (INDEPENDENT_AMBULATORY_CARE_PROVIDER_SITE_OTHER): Payer: BLUE CROSS/BLUE SHIELD | Admitting: Internal Medicine

## 2016-07-04 ENCOUNTER — Telehealth (INDEPENDENT_AMBULATORY_CARE_PROVIDER_SITE_OTHER): Payer: Self-pay | Admitting: *Deleted

## 2016-07-04 ENCOUNTER — Other Ambulatory Visit (INDEPENDENT_AMBULATORY_CARE_PROVIDER_SITE_OTHER): Payer: Self-pay | Admitting: Internal Medicine

## 2016-07-04 VITALS — BP 144/84 | HR 60 | Temp 98.3°F | Ht 72.0 in | Wt 197.5 lb

## 2016-07-04 DIAGNOSIS — Z1211 Encounter for screening for malignant neoplasm of colon: Secondary | ICD-10-CM

## 2016-07-04 DIAGNOSIS — K219 Gastro-esophageal reflux disease without esophagitis: Secondary | ICD-10-CM

## 2016-07-04 DIAGNOSIS — Z7141 Alcohol abuse counseling and surveillance of alcoholic: Secondary | ICD-10-CM | POA: Insufficient documentation

## 2016-07-04 MED ORDER — PEG 3350-KCL-NA BICARB-NACL 420 G PO SOLR: 4000.0000 mL | Freq: Once | ORAL | 0 refills | Status: AC

## 2016-07-04 NOTE — Telephone Encounter (Signed)
Patient needs trilyte 

## 2016-07-04 NOTE — Patient Instructions (Signed)
Screening colonoscopy.The risks and benefits such as perforation, bleeding, and infection were reviewed with the patient and is agreeable. 

## 2016-07-04 NOTE — Progress Notes (Signed)
   Subjective:    Patient ID: Corey Thomas, male    DOB: 04/23/1965, 51 y.o.   MRN: [id number]  HPI  Referred by Dr. Meda Coffee for GERD. States his symptoms come and go. He may go for 3-4 months without any epigastric pian.  Had epigastric pain and saw Dr. Meda Coffee and was started on Protonix. Since starting the Protonix he has not had any epigastric pain. His appetite is good. No weight loss. He has a BM daily. No melena or BRRB. No family hx of colon cancer. No Nsaids. Has never undergone a colonoscopy in the past.  (Patient did not know why he had a OV with [place] on arrival).   Review of Systems Past Medical History:  Diagnosis Date  . Arthritis   . GERD without esophagitis 06/08/2016  . Hypertension     No past surgical history on file.  No Known Allergies  Current Outpatient Prescriptions on File Prior to Visit  Medication Sig Dispense Refill  . benazepril (LOTENSIN) 10 MG tablet Take 2 tablets (20 mg total) by mouth daily. 180 tablet 3  . pantoprazole (PROTONIX) 40 MG tablet Take 1 tablet (40 mg total) by mouth daily. 30 tablet 3  . simethicone (MYLICON) 80 MG chewable tablet Chew 1 tablet (80 mg total) by mouth every 6 (six) hours as needed for flatulence. 30 tablet 0  . Vitamin D, Ergocalciferol, (DRISDOL) 50000 units CAPS capsule Take 1 capsule (50,000 Units total) by mouth every 7 (seven) days. 12 capsule 0   No current facility-administered medications on file prior to visit.        Objective:   Physical Exam Blood pressure (!) 144/84, pulse 60, temperature 98.3 F (36.8 C), height 6' (1.829 m), weight 197 lb 8 oz (89.6 kg). Alert and oriented. Skin warm and dry. Oral mucosa is moist.   . Sclera anicteric, conjunctivae is pink. Thyroid not enlarged. No cervical lymphadenopathy. Lungs clear. Heart regular rate and rhythm.  Abdomen is soft. Bowel sounds are positive. No hepatomegaly. No abdominal masses felt. No tenderness.  No edema to lower extremities.            Assessment & Plan:  GERD controlled at this time with Protonix. Screening colonoscopy. The risks and benefits such as perforation, bleeding, and infection were reviewed with the patient and is agreeable.

## 2016-07-15 ENCOUNTER — Encounter: Payer: Self-pay | Admitting: Family Medicine

## 2016-07-15 ENCOUNTER — Ambulatory Visit: Payer: BLUE CROSS/BLUE SHIELD | Admitting: Family Medicine

## 2016-07-15 DIAGNOSIS — E785 Hyperlipidemia, unspecified: Secondary | ICD-10-CM

## 2016-07-15 HISTORY — DX: Hyperlipidemia, unspecified: E78.5

## 2016-09-12 ENCOUNTER — Encounter: Payer: Self-pay | Admitting: Family Medicine

## 2016-09-12 ENCOUNTER — Ambulatory Visit (INDEPENDENT_AMBULATORY_CARE_PROVIDER_SITE_OTHER): Payer: BLUE CROSS/BLUE SHIELD | Admitting: Family Medicine

## 2016-09-12 VITALS — BP 138/80 | HR 72 | Temp 97.8°F | Resp 16 | Ht 72.0 in | Wt 198.0 lb

## 2016-09-12 DIAGNOSIS — I10 Essential (primary) hypertension: Secondary | ICD-10-CM

## 2016-09-12 DIAGNOSIS — E559 Vitamin D deficiency, unspecified: Secondary | ICD-10-CM | POA: Diagnosis not present

## 2016-09-12 DIAGNOSIS — R252 Cramp and spasm: Secondary | ICD-10-CM | POA: Diagnosis not present

## 2016-09-12 NOTE — Progress Notes (Signed)
Chief Complaint  Patient presents with  . Abdominal Cramping    x 2 days   Patient is here with his wife He works outdoors as a Development worker, international aid. He has been doing physical labor in the heat. He does a lot of perspiring. He thinks he drinks enough fluids to manage dehydration, but has been having muscular cramps in the evening. He states sometimes his urine is "dark". He does not drink enough water. He mostly drinks sweetened drinks, fruits juice, and soda. The importance of fluid replacement with water and Gatorade is emphasized to him At his last visit he was found to be vitamin D deficient. He states that he only took 4 of the vitamin D pills, and has not continued on an over-the-counter supplement. Appropriate vitamin D supplementation is reviewed with him. Diet is reviewed with him. Natural sources of vitamin D. Natural sources of potassium. He is lactose intolerant and does not drink milk.  Patient Active Problem List   Diagnosis Date Noted  . HLD (hyperlipidemia) 07/15/2016  . Encounter for screening colonoscopy 07/04/2016  . Noncompliance with medications 06/08/2016  . Heavy alcohol consumption 06/08/2016  . Tobacco dipper 06/08/2016  . GERD without esophagitis 06/08/2016  . Monocular visual disturbance 06/08/2016  . Arthritis of left hip 05/13/2014  . Essential hypertension 10/14/2013    Outpatient Encounter Prescriptions as of 09/12/2016  Medication Sig  . benazepril (LOTENSIN) 10 MG tablet Take 2 tablets (20 mg total) by mouth daily.  . cholecalciferol (VITAMIN D) 1000 units tablet Take 2,000 Units by mouth daily.   No facility-administered encounter medications on file as of 09/12/2016.     No Known Allergies  Review of Systems  Constitutional: Negative for activity change, appetite change and fatigue.  HENT: Negative for congestion and dental problem.   Eyes: Negative for visual disturbance.  Respiratory: Negative for cough and shortness of breath.   Cardiovascular:  Negative for chest pain and palpitations.  Gastrointestinal: Negative for abdominal pain, blood in stool and constipation.  Genitourinary: Negative for difficulty urinating and dysuria.  Musculoskeletal: Positive for myalgias. Negative for arthralgias.  Neurological: Negative for dizziness and headaches.  Psychiatric/Behavioral: Negative for dysphoric mood. The patient is not nervous/anxious.     BP 138/80   Pulse 72   Temp 97.8 F (36.6 C) (Temporal)   Resp 16   Ht 6' (1.829 m)   Wt 198 lb 0.3 oz (89.8 kg)   SpO2 98%   BMI 26.86 kg/m   Physical Exam  Constitutional: He is oriented to person, place, and time. He appears well-developed and well-nourished.  HENT:  Head: Normocephalic and atraumatic.  Right Ear: External ear normal.  Left Ear: External ear normal.  Mouth/Throat: Oropharynx is clear and moist.  Eyes: Conjunctivae are normal. Pupils are equal, round, and reactive to light.  Neck: Normal range of motion. Neck supple. No thyromegaly present.  Cardiovascular: Normal rate, regular rhythm and normal heart sounds.   Pulmonary/Chest: Effort normal and breath sounds normal. No respiratory distress.  Abdominal: Soft. Bowel sounds are normal.  Musculoskeletal: Normal range of motion. He exhibits no edema.  Lymphadenopathy:    He has no cervical adenopathy.  Neurological: He is alert and oriented to person, place, and time. He displays normal reflexes. Coordination normal.  Gait normal  Skin: Skin is warm and dry.  Psychiatric: He has a normal mood and affect. His behavior is normal. Thought content normal.  Nursing note and vitals reviewed.   ASSESSMENT/PLAN:  1. Vitamin D  deficiency  - VITAMIN D 25 Hydroxy (Vit-D Deficiency, Fractures)  2. Essential hypertension  - Urinalysis, Routine w reflex microscopic - BASIC METABOLIC PANEL WITH GFR - Magnesium  3. Muscle cramping  - Urinalysis, Routine w reflex microscopic - BASIC METABOLIC PANEL WITH GFR -  Magnesium   Patient Instructions  Get labs today I will send you a letter with your test results.  If there is anything of concern, we will call right away. Drink more water and gatorade Avoid caffeine and soda Take the vitamin D OTC 2000 units a day Take every day with your BP medicine  See me in 6 months Call sooner for problems   Hypokalemia Hypokalemia means that the amount of potassium in the blood is lower than normal.Potassium is a chemical that helps regulate the amount of fluid in the body (electrolyte). It also stimulates muscle tightening (contraction) and helps nerves work properly.Normally, most of the body's potassium is inside of cells, and only a very small amount is in the blood. Because the amount in the blood is so small, minor changes to potassium levels in the blood can be life-threatening. What are the causes? This condition may be caused by:  Antibiotic medicine.  Diarrhea or vomiting. Taking too much of a medicine that helps you have a bowel movement (laxative) can cause diarrhea and lead to hypokalemia.  Chronic kidney disease (CKD).  Medicines that help the body get rid of excess fluid (diuretics).  Eating disorders, such as bulimia.  Low magnesium levels in the body.  Sweating a lot.  What are the signs or symptoms? Symptoms of this condition include:  Weakness.  Constipation.  Fatigue.  Muscle cramps.  Mental confusion.  Skipped heartbeats or irregular heartbeat (palpitations).  Tingling or numbness.  How is this diagnosed? This condition is diagnosed with a blood test. How is this treated? Hypokalemia can be treated by taking potassium supplements by mouth or adjusting the medicines that you take. Treatment may also include eating more foods that contain a lot of potassium. If your potassium level is very low, you may need to get potassium through an IV tube in one of your veins and be monitored in the hospital. Follow these  instructions at home:  Take over-the-counter and prescription medicines only as told by your health care provider. This includes vitamins and supplements.  Eat a healthy diet. A healthy diet includes fresh fruits and vegetables, whole grains, healthy fats, and lean proteins.  If instructed, eat more foods that contain a lot of potassium, such as: ? Nuts, such as peanuts and pistachios. ? Seeds, such as sunflower seeds and pumpkin seeds. ? Peas, lentils, and lima beans. ? Whole grain and bran cereals and breads. ? Fresh fruits and vegetables, such as apricots, avocado, bananas, cantaloupe, kiwi, oranges, tomatoes, asparagus, and potatoes. ? Orange juice. ? Tomato juice. ? Red meats. ? Yogurt.  Keep all follow-up visits as told by your health care provider. This is important.       Raylene Everts, MD

## 2016-09-12 NOTE — Patient Instructions (Addendum)
Get labs today I will send you a letter with your test results.  If there is anything of concern, we will call right away. Drink more water and gatorade Avoid caffeine and soda Take the vitamin D OTC 2000 units a day Take every day with your BP medicine  See me in 6 months Call sooner for problems   Hypokalemia Hypokalemia means that the amount of potassium in the blood is lower than normal.Potassium is a chemical that helps regulate the amount of fluid in the body (electrolyte). It also stimulates muscle tightening (contraction) and helps nerves work properly.Normally, most of the body's potassium is inside of cells, and only a very small amount is in the blood. Because the amount in the blood is so small, minor changes to potassium levels in the blood can be life-threatening. What are the causes? This condition may be caused by:  Antibiotic medicine.  Diarrhea or vomiting. Taking too much of a medicine that helps you have a bowel movement (laxative) can cause diarrhea and lead to hypokalemia.  Chronic kidney disease (CKD).  Medicines that help the body get rid of excess fluid (diuretics).  Eating disorders, such as bulimia.  Low magnesium levels in the body.  Sweating a lot.  What are the signs or symptoms? Symptoms of this condition include:  Weakness.  Constipation.  Fatigue.  Muscle cramps.  Mental confusion.  Skipped heartbeats or irregular heartbeat (palpitations).  Tingling or numbness.  How is this diagnosed? This condition is diagnosed with a blood test. How is this treated? Hypokalemia can be treated by taking potassium supplements by mouth or adjusting the medicines that you take. Treatment may also include eating more foods that contain a lot of potassium. If your potassium level is very low, you may need to get potassium through an IV tube in one of your veins and be monitored in the hospital. Follow these instructions at home:  Take  over-the-counter and prescription medicines only as told by your health care provider. This includes vitamins and supplements.  Eat a healthy diet. A healthy diet includes fresh fruits and vegetables, whole grains, healthy fats, and lean proteins.  If instructed, eat more foods that contain a lot of potassium, such as: ? Nuts, such as peanuts and pistachios. ? Seeds, such as sunflower seeds and pumpkin seeds. ? Peas, lentils, and lima beans. ? Whole grain and bran cereals and breads. ? Fresh fruits and vegetables, such as apricots, avocado, bananas, cantaloupe, kiwi, oranges, tomatoes, asparagus, and potatoes. ? Orange juice. ? Tomato juice. ? Red meats. ? Yogurt.  Keep all follow-up visits as told by your health care provider. This is important.

## 2016-09-13 ENCOUNTER — Encounter: Payer: Self-pay | Admitting: Family Medicine

## 2016-09-13 LAB — VITAMIN D 25 HYDROXY (VIT D DEFICIENCY, FRACTURES): Vit D, 25-Hydroxy: 26 ng/mL — ABNORMAL LOW (ref 30–100)

## 2016-09-13 LAB — BASIC METABOLIC PANEL WITH GFR
BUN: 13 mg/dL (ref 7–25)
CALCIUM: 9.4 mg/dL (ref 8.6–10.3)
CO2: 22 mmol/L (ref 20–31)
Chloride: 105 mmol/L (ref 98–110)
Creat: 1.3 mg/dL (ref 0.70–1.33)
GFR, EST AFRICAN AMERICAN: 74 mL/min (ref >=60)
GFR, EST NON AFRICAN AMERICAN: 64 mL/min (ref >=60)
GLUCOSE: 86 mg/dL (ref 65–99)
POTASSIUM: 4.2 mmol/L (ref 3.5–5.3)
SODIUM: 139 mmol/L (ref 135–146)

## 2016-09-13 LAB — URINALYSIS, ROUTINE W REFLEX MICROSCOPIC
Bilirubin Urine: NEGATIVE
GLUCOSE, UA: NEGATIVE
Hgb urine dipstick: NEGATIVE
Ketones, ur: NEGATIVE
LEUKOCYTES UA: NEGATIVE
NITRITE: NEGATIVE
PH: 5.5 (ref 5.0–8.0)
Protein, ur: NEGATIVE
Specific Gravity, Urine: 1.016 (ref 1.001–1.035)

## 2016-09-13 LAB — MAGNESIUM: Magnesium: 2.2 mg/dL (ref 1.5–2.5)

## 2016-10-04 ENCOUNTER — Encounter (INDEPENDENT_AMBULATORY_CARE_PROVIDER_SITE_OTHER): Payer: Self-pay | Admitting: *Deleted

## 2016-11-11 ENCOUNTER — Encounter (HOSPITAL_COMMUNITY)
Admission: RE | Disposition: A | Discharge status: Home / Self Care | Payer: Self-pay | Source: Ambulatory Visit | Attending: Internal Medicine

## 2016-11-11 ENCOUNTER — Encounter (HOSPITAL_COMMUNITY): Payer: Self-pay

## 2016-11-11 ENCOUNTER — Ambulatory Visit (HOSPITAL_COMMUNITY)
Admission: RE | Admit: 2016-11-11 | Discharge: 2016-11-11 | Disposition: A | Discharge status: Home / Self Care | Payer: BLUE CROSS/BLUE SHIELD | Source: Ambulatory Visit | Attending: Internal Medicine | Admitting: Internal Medicine

## 2016-11-11 DIAGNOSIS — D123 Benign neoplasm of transverse colon: Secondary | ICD-10-CM | POA: Insufficient documentation

## 2016-11-11 DIAGNOSIS — K621 Rectal polyp: Secondary | ICD-10-CM

## 2016-11-11 DIAGNOSIS — M1990 Unspecified osteoarthritis, unspecified site: Secondary | ICD-10-CM | POA: Diagnosis not present

## 2016-11-11 DIAGNOSIS — E785 Hyperlipidemia, unspecified: Secondary | ICD-10-CM | POA: Diagnosis not present

## 2016-11-11 DIAGNOSIS — I10 Essential (primary) hypertension: Secondary | ICD-10-CM | POA: Diagnosis not present

## 2016-11-11 DIAGNOSIS — Z7141 Alcohol abuse counseling and surveillance of alcoholic: Secondary | ICD-10-CM | POA: Insufficient documentation

## 2016-11-11 DIAGNOSIS — K648 Other hemorrhoids: Secondary | ICD-10-CM | POA: Insufficient documentation

## 2016-11-11 DIAGNOSIS — K219 Gastro-esophageal reflux disease without esophagitis: Secondary | ICD-10-CM | POA: Insufficient documentation

## 2016-11-11 DIAGNOSIS — Z1211 Encounter for screening for malignant neoplasm of colon: Secondary | ICD-10-CM | POA: Insufficient documentation

## 2016-11-11 DIAGNOSIS — K5730 Diverticulosis of large intestine without perforation or abscess without bleeding: Secondary | ICD-10-CM | POA: Diagnosis not present

## 2016-11-11 DIAGNOSIS — D120 Benign neoplasm of cecum: Secondary | ICD-10-CM | POA: Insufficient documentation

## 2016-11-11 HISTORY — PX: COLONOSCOPY: SHX5424

## 2016-11-11 SURGERY — COLONOSCOPY
Anesthesia: Moderate Sedation

## 2016-11-11 MED ORDER — STERILE WATER FOR IRRIGATION IR SOLN
Status: DC | PRN
  Administered 2016-11-11: 11:00:00

## 2016-11-11 MED ORDER — SODIUM CHLORIDE 0.9 % IV SOLN
INTRAVENOUS | Status: DC
  Administered 2016-11-11: 10:00:00 via INTRAVENOUS

## 2016-11-11 MED ORDER — MIDAZOLAM HCL 5 MG/5ML IJ SOLN
INTRAMUSCULAR | Status: AC
  Filled 2016-11-11: qty 10

## 2016-11-11 MED ORDER — MIDAZOLAM HCL 5 MG/5ML IJ SOLN
INTRAMUSCULAR | Status: DC | PRN
  Administered 2016-11-11 (×4): 1 mg via INTRAVENOUS
  Administered 2016-11-11 (×3): 2 mg via INTRAVENOUS

## 2016-11-11 MED ORDER — MEPERIDINE HCL 50 MG/ML IJ SOLN
INTRAMUSCULAR | Status: DC | PRN
  Administered 2016-11-11 (×2): 25 mg via INTRAVENOUS

## 2016-11-11 MED ORDER — MEPERIDINE HCL 50 MG/ML IJ SOLN
INTRAMUSCULAR | Status: AC
  Filled 2016-11-11: qty 1

## 2016-11-11 MED ORDER — MIDAZOLAM HCL 5 MG/5ML IJ SOLN
INTRAMUSCULAR | Status: AC
  Filled 2016-11-11: qty 5

## 2016-11-11 NOTE — Op Note (Signed)
[facility] Patient Name: Corey Thomas Procedure Date: 11/11/2016 10:42 AM MRN: [id number] Date of Birth: 1965/10/10 Attending MD: Hildred Laser , MD CSN: [id number] Age: 51 Admit Type: Outpatient Procedure:                Colonoscopy Indications:              Screening for colorectal malignant neoplasm Providers:                Hildred Laser, MD, Otis Peak B. Sharon Seller, RN, Randa Spike, Technician Referring MD:             Lysle Morales, MD Medicines:                Meperidine 50 mg IV, Midazolam 10 mg IV Complications:            No immediate complications. Estimated Blood Loss:     Estimated blood loss: none. Procedure:                Pre-Anesthesia Assessment:                           - Prior to the procedure, a History and Physical                            was performed, and patient medications and                            allergies were reviewed. The patient's tolerance of                            previous anesthesia was also reviewed. The risks                            and benefits of the procedure and the sedation                            options and risks were discussed with the patient.                            All questions were answered, and informed consent                            was obtained. Prior Anticoagulants: The patient has                            taken no previous anticoagulant or antiplatelet                            agents. ASA Grade Assessment: I - A normal, healthy                            patient. After reviewing the risks and benefits,  the patient was deemed in satisfactory condition to                            undergo the procedure.                           After obtaining informed consent, the colonoscope                            was passed under direct vision. Throughout the                            procedure, the patient's blood pressure, pulse, and            oxygen saturations were monitored continuously. The                            EC-[id number] ([id number]) scope was introduced through                            the anus and advanced to the the cecum, identified                            by appendiceal orifice and ileocecal valve. The                            colonoscopy was somewhat difficult due to a                            tortuous colon. The patient tolerated the procedure                            well. The quality of the bowel preparation was                            adequate. The ileocecal valve, appendiceal orifice,                            and rectum were photographed. Scope In: 10:57:51 AM Scope Out: 11:50:40 AM Scope Withdrawal Time: 0 hours 41 minutes 52 seconds  Total Procedure Duration: 0 hours 52 minutes 49 seconds  Findings:      The perianal and digital rectal examinations were normal.      A 20 mm polyp was found in the cecum. The polyp was multi-lobulated. The       polyp was removed with a hot snare. Resection and retrieval were       complete. The pathology specimen was placed into Bottle Number 2.      A 12 mm polyp was found in the cecum. The polyp was semi-pedunculated.       The polyp was removed with a hot snare. Base of polyp coagulated with       APC. Resection and retrieval were complete. The pathology specimen was       placed into Bottle Number 2.      A 6 mm polyp was found in the hepatic flexure. The polyp  was       semi-sessile. The polyp was removed with a hot snare. Resection and       retrieval were complete. The pathology specimen was placed into Bottle       Number 1.      A 8 mm polyp was found in the transverse colon. The polyp was       semi-pedunculated. The polyp was removed with a hot snare. Resection was       complete, but the polyp tissue was not retrieved.      A 4 mm polyp was found in the rectum. The polyp was sessile. Coagulation       for tissue destruction using  monopolar probe was successful.      A single small-mouthed diverticulum was found in the ascending colon.      Internal hemorrhoids were found during retroflexion. The hemorrhoids       were small. Impression:               - One 20 mm polyp in the cecum, removed with a hot                            snare. Resected and retrieved.                           - One 12 mm polyp in the cecum, removed with a hot                            snare. polypectomy site treated with APC to ablate                            residual polyp at margin. Resected and retrieved.                           - One 6 mm polyp at the hepatic flexure, removed                            with a hot snare. Resected and retrieved.                           - One 8 mm polyp in the transverse colon, removed                            with a hot snare. Complete resection. Polyp tissue                            not retrieved.                           - One 4 mm polyp in the rectum. Treated with a                            monopolar probe.                           - Diverticulosis in the ascending colon.                           -  Internal hemorrhoids. Moderate Sedation:      Moderate (conscious) sedation was administered by the endoscopy nurse       and supervised by the endoscopist. The following parameters were       monitored: oxygen saturation, heart rate, blood pressure, CO2       capnography and response to care. Total physician intraservice time was       58 minutes. Recommendation:           - Patient has a contact number available for                            emergencies. The signs and symptoms of potential                            delayed complications were discussed with the                            patient. Return to normal activities tomorrow.                            Written discharge instructions were provided to the                            patient.                           - Resume  previous diet today.                           - Continue present medications.                           - No aspirin, ibuprofen, naproxen, or other                            non-steroidal anti-inflammatory drugs for 10 days                            after polyp removal.                           - Await pathology results.                           - Repeat colonoscopy for surveillance based on                            pathology results. Procedure Code(s):        --- Professional ---                           [phone], Colonoscopy, flexible; with ablation of                            tumor(s), polyp(s), or other lesion(s) (includes  pre- and post-dilation and guide wire passage, when                            performed)                           45385, 59, Colonoscopy, flexible; with removal of                            tumor(s), polyp(s), or other lesion(s) by snare                            technique                           99152, Moderate sedation services provided by the                            same physician or other qualified health care                            professional performing the diagnostic or                            therapeutic service that the sedation supports,                            requiring the presence of an independent trained                            observer to assist in the monitoring of the                            patient's level of consciousness and physiological                            status; initial 15 minutes of intraservice time,                            patient age 85 years or older                           [phone], Moderate sedation services; each additional                            15 minutes intraservice time                           99153, Moderate sedation services; each additional                            15 minutes intraservice time                           99153, Moderate sedation  services; each additional  15 minutes intraservice time Diagnosis Code(s):        --- Professional ---                           Z12.11, Encounter for screening for malignant                            neoplasm of colon                           D12.0, Benign neoplasm of cecum                           D12.3, Benign neoplasm of transverse colon (hepatic                            flexure or splenic flexure)                           K62.1, Rectal polyp                           K64.8, Other hemorrhoids                           K57.30, Diverticulosis of large intestine without                            perforation or abscess without bleeding CPT copyright 2016 [employer]. All rights reserved. The codes documented in this report are preliminary and upon coder review may  be revised to meet current compliance requirements. Hildred Laser, MD Hildred Laser, MD 11/11/2016 12:06:02 PM This report has been signed electronically. Number of Addenda: 0

## 2016-11-11 NOTE — Discharge Instructions (Signed)
No aspirin or NSAIDs for 10 days. Resume usual medications and diet. No driving for 24 hours. Physician will call with biopsy results.     Colonoscopy, Adult, Care After This sheet gives you information about how to care for yourself after your procedure. Your doctor may also give you more specific instructions. If you have problems or questions, call your doctor. Follow these instructions at home: General instructions   For the first 24 hours after the procedure: ? Do not drive or use machinery. ? Do not sign important documents. ? Do not drink alcohol. ? Do your daily activities more slowly than normal. ? Eat foods that are soft and easy to digest. ? Rest often.  Take over-the-counter or prescription medicines only as told by your doctor.  It is up to you to get the results of your procedure. Ask your doctor, or the department performing the procedure, when your results will be ready. To help cramping and bloating:  Try walking around.  Put heat on your belly (abdomen) as told by your doctor. Use a heat source that your doctor recommends, such as a moist heat pack or a heating pad. ? Put a towel between your skin and the heat source. ? Leave the heat on for 20-30 minutes. ? Remove the heat if your skin turns bright red. This is especially important if you cannot feel pain, heat, or cold. You can get burned. Eating and drinking  Drink enough fluid to keep your pee (urine) clear or pale yellow.  Return to your normal diet as told by your doctor. Avoid heavy or fried foods that are hard to digest.  Avoid drinking alcohol for as long as told by your doctor. Contact a doctor if:  You have blood in your poop (stool) 2-3 days after the procedure. Get help right away if:  You have more than a small amount of blood in your poop.  You see large clumps of tissue (blood clots) in your poop.  Your belly is swollen.  You feel sick to your stomach (nauseous).  You throw up  (vomit).  You have a fever.  You have belly pain that gets worse, and medicine does not help your pain. This information is not intended to replace advice given to you by your health care provider. Make sure you discuss any questions you have with your health care provider. Document Released: 04/09/2010 Document Revised: 11/30/2015 Document Reviewed: 11/30/2015 Elsevier Interactive Patient Education  2017 [place].    Colon Polyps Polyps are tissue growths inside the body. Polyps can grow in many places, including the large intestine (colon). A polyp may be a round bump or a mushroom-shaped growth. You could have one polyp or several. Most colon polyps are noncancerous (benign). However, some colon polyps can become cancerous over time. What are the causes? The exact cause of colon polyps is not known. What increases the risk? This condition is more likely to develop in people who:  Have a family history of colon cancer or colon polyps.  Are older than 55 or older than 45 if they are African American.  Have inflammatory bowel disease, such as ulcerative colitis or Crohn disease.  Are overweight.  Smoke cigarettes.  Do not get enough exercise.  Drink too much alcohol.  Eat a diet that is: ? High in fat and red meat. ? Low in fiber.  Had childhood cancer that was treated with abdominal radiation.  What are the signs or symptoms? Most polyps do not  cause symptoms. If you have symptoms, they may include:  Blood coming from your rectum when having a bowel movement.  Blood in your stool.The stool may look dark red or black.  A change in bowel habits, such as constipation or diarrhea.  How is this diagnosed? This condition is diagnosed with a colonoscopy. This is a procedure that uses a lighted, flexible scope to look at the inside of your colon. How is this treated? Treatment for this condition involves removing any polyps that are found. Those polyps will then be  tested for cancer. If cancer is found, your health care provider will talk to you about options for colon cancer treatment. Follow these instructions at home: Diet  Eat plenty of fiber, such as fruits, vegetables, and whole grains.  Eat foods that are high in calcium and vitamin D, such as milk, cheese, yogurt, eggs, liver, fish, and broccoli.  Limit foods high in fat, red meats, and processed meats, such as hot dogs, sausage, bacon, and lunch meats.  Maintain a healthy weight, or lose weight if recommended by your health care provider. General instructions  Do not smoke cigarettes.  Do not drink alcohol excessively.  Keep all follow-up visits as told by your health care provider. This is important. This includes keeping regularly scheduled colonoscopies. Talk to your health care provider about when you need a colonoscopy.  Exercise every day or as told by your health care provider. Contact a health care provider if:  You have new or worsening bleeding during a bowel movement.  You have new or increased blood in your stool.  You have a change in bowel habits.  You unexpectedly lose weight. This information is not intended to replace advice given to you by your health care provider. Make sure you discuss any questions you have with your health care provider. Document Released: 12/02/2003 Document Revised: 08/13/2015 Document Reviewed: 01/26/2015 Elsevier Interactive Patient Education  [employer].

## 2016-11-11 NOTE — H&P (Signed)
Corey Thomas is an 51 y.o. male.   Chief Complaint: Patient is here for colonoscopy. HPI: Patient is 51 year old African-American male was here for screening colonoscopy. He denies abdominal pain change in bowel habits or rectal bleeding. He has good appetite and his weight is stable. Family history is negative for CRC.  Past Medical History:  Diagnosis Date  . Arthritis   . GERD without esophagitis 06/08/2016  . HLD (hyperlipidemia) 07/15/2016  . Hypertension   . Vitamin D deficiency     Past Surgical History:  Procedure Laterality Date  . NO PAST SURGERIES      Family History  Problem Relation Age of Onset  . Healthy Sister   . Healthy Sister   . Alcohol abuse Brother 41       cirrhosis  . Diabetes Other   . Osteoarthritis Other   . Arthritis Maternal Grandmother    Social History:  reports that he has never smoked. His smokeless tobacco use includes Snuff. He reports that he drinks about 1.8 oz of alcohol per week . He reports that he does not use drugs.  Allergies: No Known Allergies  Medications Prior to Admission  Medication Sig Dispense Refill  . benazepril (LOTENSIN) 10 MG tablet Take 2 tablets (20 mg total) by mouth daily. 180 tablet 3  . cholecalciferol (VITAMIN D) 1000 units tablet Take 2,000 Units by mouth daily.      No results found for this or any previous visit (from the past 48 hour(s)). No results found.  ROS  Blood pressure 139/87, pulse (!) 57, temperature 98.2 F (36.8 C), temperature source Oral, resp. rate 14, height 6' (1.829 m), weight 195 lb (88.5 kg), SpO2 100 %. Physical Exam  Constitutional: He appears well-developed and well-nourished.  HENT:  Mouth/Throat: Oropharynx is clear and moist.  Eyes: Conjunctivae are normal. No scleral icterus.  Neck: No thyromegaly present.  Cardiovascular: Normal rate, regular rhythm and normal heart sounds.   No murmur heard. Respiratory: Effort normal and breath sounds normal.  GI: Soft. He exhibits  no distension and no mass. There is no tenderness.  Musculoskeletal: He exhibits no edema.  Lymphadenopathy:    He has no cervical adenopathy.  Neurological: He is alert.  Skin: Skin is warm and dry.     Assessment/Plan Average risk screening colonoscopy.  Hildred Laser, MD 11/11/2016, 10:49 AM

## 2016-11-14 ENCOUNTER — Encounter: Payer: Self-pay | Admitting: Family Medicine

## 2016-11-14 DIAGNOSIS — D126 Benign neoplasm of colon, unspecified: Secondary | ICD-10-CM

## 2016-11-14 HISTORY — DX: Benign neoplasm of colon, unspecified: D12.6

## 2016-11-15 ENCOUNTER — Encounter (HOSPITAL_COMMUNITY): Payer: Self-pay | Admitting: Internal Medicine

## 2016-12-07 ENCOUNTER — Telehealth: Payer: Self-pay | Admitting: Family Medicine

## 2016-12-07 NOTE — Telephone Encounter (Signed)
Called patient to r/s per dr.nelson No answer; no vm (i added him to the cancellation list as well)

## 2016-12-15 ENCOUNTER — Ambulatory Visit: Payer: BLUE CROSS/BLUE SHIELD | Admitting: Family Medicine

## 2016-12-15 DIAGNOSIS — K5790 Diverticulosis of intestine, part unspecified, without perforation or abscess without bleeding: Secondary | ICD-10-CM | POA: Insufficient documentation

## 2016-12-15 HISTORY — DX: Diverticulosis of intestine, part unspecified, without perforation or abscess without bleeding: K57.90

## 2017-02-07 ENCOUNTER — Telehealth: Payer: Self-pay | Admitting: *Deleted

## 2017-02-07 NOTE — Telephone Encounter (Signed)
Patient's wife called left message stating patient needs to be seen for arthritis and he doesn't have anything for pain. When can this patient be scheduled?

## 2017-02-07 NOTE — Telephone Encounter (Signed)
I called patient to schedule. Phone is not working per [employer]

## 2017-02-07 NOTE — Telephone Encounter (Signed)
Next available return,

## 2017-02-13 NOTE — Telephone Encounter (Signed)
I have tried to call pt again, am not able to get through on number provided.

## 2017-02-14 ENCOUNTER — Encounter (HOSPITAL_COMMUNITY): Payer: Self-pay

## 2017-02-14 ENCOUNTER — Emergency Department (HOSPITAL_COMMUNITY): Payer: BLUE CROSS/BLUE SHIELD

## 2017-02-14 ENCOUNTER — Emergency Department (HOSPITAL_COMMUNITY)
Admission: EM | Admit: 2017-02-14 | Discharge: 2017-02-14 | Disposition: A | Discharge status: Home / Self Care | Payer: BLUE CROSS/BLUE SHIELD | Attending: Emergency Medicine | Admitting: Emergency Medicine

## 2017-02-14 DIAGNOSIS — Y990 Civilian activity done for income or pay: Secondary | ICD-10-CM | POA: Diagnosis not present

## 2017-02-14 DIAGNOSIS — F17290 Nicotine dependence, other tobacco product, uncomplicated: Secondary | ICD-10-CM | POA: Insufficient documentation

## 2017-02-14 DIAGNOSIS — Y33XXXA Other specified events, undetermined intent, initial encounter: Secondary | ICD-10-CM | POA: Insufficient documentation

## 2017-02-14 DIAGNOSIS — Z79899 Other long term (current) drug therapy: Secondary | ICD-10-CM | POA: Diagnosis not present

## 2017-02-14 DIAGNOSIS — Y9289 Other specified places as the place of occurrence of the external cause: Secondary | ICD-10-CM | POA: Insufficient documentation

## 2017-02-14 DIAGNOSIS — E785 Hyperlipidemia, unspecified: Secondary | ICD-10-CM | POA: Diagnosis not present

## 2017-02-14 DIAGNOSIS — Y9389 Activity, other specified: Secondary | ICD-10-CM | POA: Insufficient documentation

## 2017-02-14 DIAGNOSIS — I10 Essential (primary) hypertension: Secondary | ICD-10-CM | POA: Diagnosis not present

## 2017-02-14 DIAGNOSIS — S8991XA Unspecified injury of right lower leg, initial encounter: Secondary | ICD-10-CM | POA: Diagnosis present

## 2017-02-14 DIAGNOSIS — S8391XA Sprain of unspecified site of right knee, initial encounter: Secondary | ICD-10-CM | POA: Diagnosis not present

## 2017-02-14 MED ORDER — ONDANSETRON HCL 4 MG PO TABS
4.0000 mg | ORAL_TABLET | Freq: Once | ORAL | Status: AC
  Administered 2017-02-14: 4 mg via ORAL
  Filled 2017-02-14: qty 1

## 2017-02-14 MED ORDER — IBUPROFEN 800 MG PO TABS
800.0000 mg | ORAL_TABLET | Freq: Once | ORAL | Status: AC
  Administered 2017-02-14: 800 mg via ORAL
  Filled 2017-02-14: qty 1

## 2017-02-14 MED ORDER — TRAMADOL HCL 50 MG PO TABS: ORAL_TABLET | ORAL | 0 refills | Status: DC

## 2017-02-14 MED ORDER — TRAMADOL HCL 50 MG PO TABS
100.0000 mg | ORAL_TABLET | Freq: Once | ORAL | Status: AC
  Administered 2017-02-14: 100 mg via ORAL
  Filled 2017-02-14: qty 2

## 2017-02-14 MED ORDER — IBUPROFEN 600 MG PO TABS: 600.0000 mg | ORAL_TABLET | Freq: Four times a day (QID) | ORAL | 0 refills | Status: DC

## 2017-02-14 NOTE — Discharge Instructions (Signed)
Your x-ray is negative for fracture or dislocation.  Your examination suggests knee strain/sprain.  Please use your knee immobilizer over the next 3 days.  Use your crutches until you are able to safely apply weight to your lower extremity.  Please rest your knee is much as possible.  Please see Dr. Aline Brochure for orthopedic evaluation and management if this pain continues or is not improving.  Your blood pressure is extremely elevated at 175/111 today in the emergency department.  Please see your primary physician to have this rechecked, and to see if you need an adjustment in your blood pressure medications.  This should be done within the next 5-7 days.

## 2017-02-14 NOTE — ED Triage Notes (Signed)
Pt reports was at work and his leg slipped of of the truck.  Says it didn't hit anything but woke up this morning and it was hurting.

## 2017-02-14 NOTE — ED Provider Notes (Signed)
[facility] EMERGENCY DEPARTMENT Provider Note   CSN: [id number] Arrival date & time: 02/14/17  7989     History   Chief Complaint Chief Complaint  Patient presents with  . Knee Pain    HPI Corey Thomas is a 51 y.o. male.  Patient is a 51 year old male who presents to the emergency department with a complaint of right knee and leg pain. The patient states he was at work.  He slipped on 1 of the steps of his truck.  He states that his body went one way and his leg went another.  He was able to continue working at that time, but states this morning he could barely walk on it and had to have help getting out of the bed with.  He denies actually hitting the knee, but thinks he twisted it.  He has not had any previous operations or procedures involving the lower extremity.  He is not on any anticoagulation medications.  No other injury reported at this time.       Past Medical History:  Diagnosis Date  . Arthritis   . GERD without esophagitis 06/08/2016  . HLD (hyperlipidemia) 07/15/2016  . Hypertension   . Vitamin D deficiency     Patient Active Problem List   Diagnosis Date Noted  . Diverticulosis 12/15/2016  . Tubular adenoma of colon 11/14/2016  . HLD (hyperlipidemia) 07/15/2016  . Encounter for screening colonoscopy 07/04/2016  . Noncompliance with medications 06/08/2016  . Heavy alcohol consumption 06/08/2016  . Tobacco dipper 06/08/2016  . GERD without esophagitis 06/08/2016  . Monocular visual disturbance 06/08/2016  . Arthritis of left hip 05/13/2014  . Essential hypertension 10/14/2013    Past Surgical History:  Procedure Laterality Date  . COLONOSCOPY N/A 11/11/2016   Procedure: COLONOSCOPY;  Surgeon: Rogene Houston, MD;  Location: [facility];  Service: Endoscopy;  Laterality: N/A;  12:55-rescheduled to 8/24 @ 10:25am per Lelon Frohlich  . NO PAST SURGERIES         Home Medications    Prior to Admission medications   Medication Sig Start Date End Date  Taking? Authorizing Provider  benazepril (LOTENSIN) 10 MG tablet Take 2 tablets (20 mg total) by mouth daily. 06/29/16   Raylene Everts, MD  cholecalciferol (VITAMIN D) 1000 units tablet Take 2,000 Units by mouth daily.    [provider]    Family History Family History  Problem Relation Age of Onset  . Healthy Sister   . Healthy Sister   . Alcohol abuse Brother 41       cirrhosis  . Diabetes Other   . Osteoarthritis Other   . Arthritis Maternal Grandmother     Social History Social History   Tobacco Use  . Smoking status: Never Smoker  . Smokeless tobacco: Current User    Types: Snuff  Substance Use Topics  . Alcohol use: Yes    Alcohol/week: 1.8 oz    Types: 3 Cans of beer per week    Comment: 3-4 beers daily  . Drug use: No     Allergies   Patient has no known allergies.   Review of Systems Review of Systems  Constitutional: Negative for activity change.       All ROS Neg except as noted in HPI  HENT: Negative for nosebleeds.   Eyes: Negative for photophobia and discharge.  Respiratory: Negative for cough, shortness of breath and wheezing.   Cardiovascular: Negative for chest pain and palpitations.  Gastrointestinal: Negative for abdominal  pain and blood in stool.  Genitourinary: Negative for dysuria, frequency and hematuria.  Musculoskeletal: Positive for arthralgias. Negative for back pain and neck pain.  Skin: Negative.   Neurological: Negative for dizziness, seizures and speech difficulty.  Psychiatric/Behavioral: Negative for confusion and hallucinations.     Physical Exam Updated Vital Signs BP (!) 175/111 Comment: has not taken b/p medicine today   Pulse 76   Temp 99.4 F (37.4 C) (Temporal)   Resp 16   Ht 6' (1.829 m)   Wt 89.8 kg (198 lb)   SpO2 97%   BMI 26.85 kg/m   Physical Exam  Constitutional: He is oriented to person, place, and time. He appears well-developed and well-nourished.  Non-toxic appearance.  HENT:  Head:  Normocephalic.  Right Ear: Tympanic membrane and external ear normal.  Left Ear: Tympanic membrane and external ear normal.  Eyes: EOM and lids are normal. Pupils are equal, round, and reactive to light.  Neck: Normal range of motion. Neck supple. Carotid bruit is not present.  Cardiovascular: Normal rate, regular rhythm, normal heart sounds, intact distal pulses and normal pulses.  Pulmonary/Chest: Breath sounds normal. No respiratory distress.  Abdominal: Soft. Bowel sounds are normal. There is no tenderness. There is no guarding.  Musculoskeletal:       Right hip: Normal.       Right knee: He exhibits decreased range of motion. He exhibits no effusion and no deformity. Tenderness found. Medial joint line tenderness noted. No patellar tendon tenderness noted.       Right ankle: Normal.       Legs: Lymphadenopathy:       Head (right side): No submandibular adenopathy present.       Head (left side): No submandibular adenopathy present.    He has no cervical adenopathy.  Neurological: He is alert and oriented to person, place, and time. He has normal strength. No cranial nerve deficit or sensory deficit.  Skin: Skin is warm and dry.  Psychiatric: He has a normal mood and affect. His speech is normal.  Nursing note and vitals reviewed.    ED Treatments / Results  Labs (all labs ordered are listed, but only abnormal results are displayed) Labs Reviewed - No data to display  EKG  EKG Interpretation None       Radiology Dg Knee Complete 4 Views Right  Result Date: 02/14/2017 CLINICAL DATA:  Right knee pain. EXAM: RIGHT KNEE - COMPLETE 4+ VIEW COMPARISON:  None. FINDINGS: No evidence of fracture, dislocation, or joint effusion. No evidence of arthropathy or other focal bone abnormality. Soft tissues are unremarkable. IMPRESSION: Negative. Electronically Signed   By: Fidela Salisbury M.D.   On: 02/14/2017 10:22    Procedures Procedures (including critical care  time)  Medications Ordered in ED Medications  ibuprofen (ADVIL,MOTRIN) tablet 800 mg (800 mg Oral Given 02/14/17 1120)  traMADol (ULTRAM) tablet 100 mg (100 mg Oral Given 02/14/17 1120)  ondansetron (ZOFRAN) tablet 4 mg (4 mg Oral Given 02/14/17 1120)     Initial Impression / Assessment and Plan / ED Course  I have reviewed the triage vital signs and the nursing notes.  Pertinent labs & imaging results that were available during my care of the patient were reviewed by me and considered in my medical decision making (see chart for details).       Final Clinical Impressions(s) / ED Diagnoses MDM Blood pressure is elevated.  Patient has a history of hypertension.  We will asked the patient to  have this rechecked soon. No neurovascular deficits noted of the right lower extremity.  There is good range of motion of the right hip and the right ankle.  There is no deformity appreciated.  There is pain with attempted flexion and extension of the right knee.  There is no effusion.  There is pain at the medial more so than the lateral area.  There is some pain in the posterior area of the knee, but no swelling or mass appreciated.  The examination suggest possible strain/sprain of the knee.  No neurovascular deficits appreciated.  Patient will be fitted with a knee immobilizer.  He will be  Treated with ibuprofen and Ultram.  He is referred to orthopedics for additional evaluation and management if not improving.   Final diagnoses:  Sprain of right knee, unspecified ligament, initial encounter    ED Discharge Orders        Ordered    ibuprofen (ADVIL,MOTRIN) 600 MG tablet  4 times daily     02/14/17 1129    traMADol (ULTRAM) 50 MG tablet     02/14/17 1129       Lily Kocher, PA-C 02/18/17 1131    Milton Ferguson, MD 02/18/17 1551

## 2017-02-21 ENCOUNTER — Ambulatory Visit (INDEPENDENT_AMBULATORY_CARE_PROVIDER_SITE_OTHER): Payer: BLUE CROSS/BLUE SHIELD | Admitting: Family Medicine

## 2017-02-21 ENCOUNTER — Encounter: Payer: Self-pay | Admitting: Family Medicine

## 2017-02-21 ENCOUNTER — Other Ambulatory Visit: Payer: Self-pay

## 2017-02-21 VITALS — BP 138/88 | HR 72 | Temp 97.0°F | Resp 18 | Ht 72.0 in | Wt 195.0 lb

## 2017-02-21 DIAGNOSIS — F5101 Primary insomnia: Secondary | ICD-10-CM | POA: Diagnosis not present

## 2017-02-21 DIAGNOSIS — M1711 Unilateral primary osteoarthritis, right knee: Secondary | ICD-10-CM | POA: Diagnosis not present

## 2017-02-21 DIAGNOSIS — I10 Essential (primary) hypertension: Secondary | ICD-10-CM | POA: Diagnosis not present

## 2017-02-21 DIAGNOSIS — E785 Hyperlipidemia, unspecified: Secondary | ICD-10-CM

## 2017-02-21 MED ORDER — IBUPROFEN 800 MG PO TABS: 800.0000 mg | ORAL_TABLET | Freq: Three times a day (TID) | ORAL | 3 refills | Status: DC | PRN

## 2017-02-21 NOTE — Progress Notes (Signed)
Chief Complaint  Patient presents with  . Follow-up   Patient is here for emergency room follow-up. He twisted his knee last week and had severe pain.  He went to the emergency room had x-rays.  It showed early arthritis and "bone spurs".  He was treated with ibuprofen 600 mg 3 times a day, and tramadol for pain.  He states the tramadol made him "sweat" so he only took 1 dose.  Ibuprofen is helping.  The knee pain is much better.  He went back to work yesterday. I reviewed his ER report and x-rays with him.  He does have mild medial joint space narrowing.  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  He has a tiny bone spur on his superior patella which is also a traction spur. Patient also complains of insomnia.  He states that he goes to bed at night, then wakes up around 10 or 11 PM.  He then stays awake until he goes to work at 4.  He does not feel rested.  He has not tried any over-the-counter medicines for pain.  He states when he wakes up he watches television. He has quit drinking alcohol.  When I saw him last, and told him that alcohol would make his blood pressure worse he decided to quit.  He has had no alcohol since that time.  He is here with his wife who corroborates his history.  He is congratulated on his ability to stop drinking, and for his efforts to improve his health.  Patient Active Problem List   Diagnosis Date Noted  . Primary osteoarthritis of right knee 02/21/2017  . Primary insomnia 02/21/2017  . Diverticulosis 12/15/2016  . Tubular adenoma of colon 11/14/2016  . HLD (hyperlipidemia) 07/15/2016  . Encounter for screening colonoscopy 07/04/2016  . Noncompliance with medications 06/08/2016  . Tobacco dipper 06/08/2016  . GERD without esophagitis 06/08/2016  . Monocular visual disturbance 06/08/2016  . Arthritis of left hip 05/13/2014  . Essential hypertension 10/14/2013     Outpatient Encounter Medications as of 02/21/2017  Medication Sig  . benazepril (LOTENSIN) 10 MG tablet Take 2 tablets (20 mg total) by mouth daily.  . cholecalciferol (VITAMIN D) 1000 units tablet Take 2,000 Units by mouth daily.  Marland Kitchen ibuprofen (ADVIL,MOTRIN) 800 MG tablet Take 1 tablet (800 mg total) by mouth every 8 (eight) hours as needed for moderate pain. TAKE WITH FOOD   No facility-administered encounter medications on file as of 02/21/2017.     Allergies  Allergen Reactions  . Tramadol Other (See Comments)    Patient states "sweats"    Review of Systems  Constitutional: Negative for activity change, appetite change and fatigue.  HENT: Negative for congestion and dental problem.   Eyes: Negative for visual disturbance.  Respiratory: Negative for cough and shortness of breath.   Cardiovascular: Negative for chest pain and palpitations.  Gastrointestinal: Negative for abdominal pain, blood in stool and constipation.  Genitourinary: Negative for difficulty urinating and dysuria.  Musculoskeletal: Positive for arthralgias and myalgias.  Neurological: Negative for dizziness and headaches.  Psychiatric/Behavioral: Positive for sleep disturbance. Negative for dysphoric mood. The patient is not nervous/anxious.     BP 138/88 (BP Location: Right Arm, Patient Position: Sitting, Cuff Size: Large)   Pulse 72   Temp (!) 97 F (36.1 C) (Temporal)   Resp 18   Ht 6' (1.829 m)   Wt 195 lb (  88.5 kg)   SpO2 100%   BMI 26.45 kg/m   Physical Exam  Constitutional: He is oriented to person, place, and time. He appears well-developed and well-nourished.  HENT:  Head: Normocephalic and atraumatic.  Mouth/Throat: Oropharynx is clear and moist.  Eyes: Conjunctivae are normal. Pupils are equal, round, and reactive to light.  Neck: Normal range of motion. Neck supple. No thyromegaly present.  Cardiovascular: Normal rate, regular rhythm and normal heart sounds.  Pulmonary/Chest: Effort  normal and breath sounds normal. No respiratory distress.  Musculoskeletal: Normal range of motion. He exhibits no edema.  Right knee with trace effusion.  Mild warmth.  Slight "click" with extension.  No instability.  Mild medial joint line tenderness.  Neurological: He is alert and oriented to person, place, and time. He displays normal reflexes.  Gait normal  Skin: Skin is warm and dry.  Psychiatric: He has a normal mood and affect. His behavior is normal. Thought content normal.  Nursing note and vitals reviewed.   ASSESSMENT/PLAN:  1. Primary osteoarthritis of right knee Discussed joint protection.  Discussed anti-inflammatories and ice.  Discussed long-term ibuprofen can cause GI distress, potentially kidney impairment.  He will take daily until his knee pain resolves, then as needed.  He will watch for heartburn.  2. Essential hypertension Controlled - CBC - COMPLETE METABOLIC PANEL WITH GFR - Urinalysis, Routine w reflex microscopic  3. Hyperlipidemia, unspecified hyperlipidemia type Patient is on diet, needs repeat lipids - Lipid panel  4. Primary insomnia Discussed.  Discussed diet and exercise.  Avoiding caffeine.  Avoiding alcohol.  Going to bed at the same time every night, waking up at the same time every morning.  Limiting "screen time".  Over-the-counter medications.   Patient Instructions  TAKE THE IBUPROFEN FOR PAIN AND INFLAMMATION OF THE KNEE USE FOR ANY PAINFUL JOINT USE ICE AFTER WORK OR ACTIVITY IF PAINFUL  NEED FOLLOW UP AND LAB TESTS IN 3 MONTHS  TRY UNISOM ( DOXYLAMINE SUCCINATE) FOR SLEEP THIS IS OTC MAY ALSO TRY MELATONIN TAKE AN HOUR BEFORE BED     Insomnia Insomnia is a sleep disorder that makes it difficult to fall asleep or to stay asleep. Insomnia can cause tiredness (fatigue), low energy, difficulty concentrating, mood swings, and poor performance at work or school. There are three different ways to classify insomnia:  Difficulty  falling asleep.  Difficulty staying asleep.  Waking up too early in the morning.  Any type of insomnia can be long-term (chronic) or short-term (acute). Both are common. Short-term insomnia usually lasts for three months or less. Chronic insomnia occurs at least three times a week for longer than three months. What are the causes? Insomnia may be caused by another condition, situation, or substance, such as:  Anxiety.  Certain medicines.  Gastroesophageal reflux disease (GERD) or other gastrointestinal conditions.  Asthma or other breathing conditions.  Restless legs syndrome, sleep apnea, or other sleep disorders.  Chronic pain.  Menopause. This may include hot flashes.  Stroke.  Abuse of alcohol, tobacco, or illegal drugs.  Depression.  Caffeine.  Neurological disorders, such as Alzheimer disease.  An overactive thyroid (hyperthyroidism).  The cause of insomnia may not be known. What increases the risk? Risk factors for insomnia include:  Gender. Women are more commonly affected than men.  Age. Insomnia is more common as you get older.  Stress. This may involve your professional or personal life.  Income. Insomnia is more common in people with lower income.  Lack of exercise.  Irregular work schedule or night shifts.  Traveling between different time zones.  What are the signs or symptoms? If you have insomnia, trouble falling asleep or trouble staying asleep is the main symptom. This may lead to other symptoms, such as:  Feeling fatigued.  Feeling nervous about going to sleep.  Not feeling rested in the morning.  Having trouble concentrating.  Feeling irritable, anxious, or depressed.  How is this treated? Treatment for insomnia depends on the cause. If your insomnia is caused by an underlying condition, treatment will focus on addressing the condition. Treatment may also include:  Medicines to help you sleep.  Counseling or  therapy.  Lifestyle adjustments.  Follow these instructions at home:  Take medicines only as directed by your health care provider.  Keep regular sleeping and waking hours. Avoid naps.  Keep a sleep diary to help you and your health care provider figure out what could be causing your insomnia. Include: ? When you sleep. ? When you wake up during the night. ? How well you sleep. ? How rested you feel the next day. ? Any side effects of medicines you are taking. ? What you eat and drink.  Make your bedroom a comfortable place where it is easy to fall asleep: ? Put up shades or special blackout curtains to block light from outside. ? Use a white noise machine to block noise. ? Keep the temperature cool.  Exercise regularly as directed by your health care provider. Avoid exercising right before bedtime.  Use relaxation techniques to manage stress. Ask your health care provider to suggest some techniques that may work well for you. These may include: ? Breathing exercises. ? Routines to release muscle tension. ? Visualizing peaceful scenes.  Cut back on alcohol, caffeinated beverages, and cigarettes, especially close to bedtime. These can disrupt your sleep.  Do not overeat or eat spicy foods right before bedtime. This can lead to digestive discomfort that can make it hard for you to sleep.  Limit screen use before bedtime. This includes: ? Watching TV. ? Using your smartphone, tablet, and computer.  Stick to a routine. This can help you fall asleep faster. Try to do a quiet activity, brush your teeth, and go to bed at the same time each night.  Get out of bed if you are still awake after 15 minutes of trying to sleep. Keep the lights down, but try reading or doing a quiet activity. When you feel sleepy, go back to bed.  Make sure that you drive carefully. Avoid driving if you feel very sleepy.  Keep all follow-up appointments as directed by your health care provider. This is  important. Contact a health care provider if:  You are tired throughout the day or have trouble in your daily routine due to sleepiness.  You continue to have sleep problems or your sleep problems get worse. Get help right away if:  You have serious thoughts about hurting yourself or someone else. This information is not intended to replace advice given to you by your health care provider. Make sure you discuss any questions you have with your health care provider. Document Released: 03/04/2000 Document Revised: 08/07/2015 Document Reviewed: 12/06/2013 Elsevier Interactive Patient Education  2018 [employer]    Raylene Everts, MD

## 2017-02-21 NOTE — Patient Instructions (Addendum)
TAKE THE IBUPROFEN FOR PAIN AND INFLAMMATION OF THE KNEE USE FOR ANY PAINFUL JOINT USE ICE AFTER WORK OR ACTIVITY IF PAINFUL  NEED FOLLOW UP AND LAB TESTS IN 3 MONTHS  TRY UNISOM ( DOXYLAMINE SUCCINATE) FOR SLEEP THIS IS OTC MAY ALSO TRY MELATONIN TAKE AN HOUR BEFORE BED     Insomnia Insomnia is a sleep disorder that makes it difficult to fall asleep or to stay asleep. Insomnia can cause tiredness (fatigue), low energy, difficulty concentrating, mood swings, and poor performance at work or school. There are three different ways to classify insomnia:  Difficulty falling asleep.  Difficulty staying asleep.  Waking up too early in the morning.  Any type of insomnia can be long-term (chronic) or short-term (acute). Both are common. Short-term insomnia usually lasts for three months or less. Chronic insomnia occurs at least three times a week for longer than three months. What are the causes? Insomnia may be caused by another condition, situation, or substance, such as:  Anxiety.  Certain medicines.  Gastroesophageal reflux disease (GERD) or other gastrointestinal conditions.  Asthma or other breathing conditions.  Restless legs syndrome, sleep apnea, or other sleep disorders.  Chronic pain.  Menopause. This may include hot flashes.  Stroke.  Abuse of alcohol, tobacco, or illegal drugs.  Depression.  Caffeine.  Neurological disorders, such as Alzheimer disease.  An overactive thyroid (hyperthyroidism).  The cause of insomnia may not be known. What increases the risk? Risk factors for insomnia include:  Gender. Women are more commonly affected than men.  Age. Insomnia is more common as you get older.  Stress. This may involve your professional or personal life.  Income. Insomnia is more common in people with lower income.  Lack of exercise.  Irregular work schedule or night shifts.  Traveling between different time zones.  What are the signs or  symptoms? If you have insomnia, trouble falling asleep or trouble staying asleep is the main symptom. This may lead to other symptoms, such as:  Feeling fatigued.  Feeling nervous about going to sleep.  Not feeling rested in the morning.  Having trouble concentrating.  Feeling irritable, anxious, or depressed.  How is this treated? Treatment for insomnia depends on the cause. If your insomnia is caused by an underlying condition, treatment will focus on addressing the condition. Treatment may also include:  Medicines to help you sleep.  Counseling or therapy.  Lifestyle adjustments.  Follow these instructions at home:  Take medicines only as directed by your health care provider.  Keep regular sleeping and waking hours. Avoid naps.  Keep a sleep diary to help you and your health care provider figure out what could be causing your insomnia. Include: ? When you sleep. ? When you wake up during the night. ? How well you sleep. ? How rested you feel the next day. ? Any side effects of medicines you are taking. ? What you eat and drink.  Make your bedroom a comfortable place where it is easy to fall asleep: ? Put up shades or special blackout curtains to block light from outside. ? Use a white noise machine to block noise. ? Keep the temperature cool.  Exercise regularly as directed by your health care provider. Avoid exercising right before bedtime.  Use relaxation techniques to manage stress. Ask your health care provider to suggest some techniques that may work well for you. These may include: ? Breathing exercises. ? Routines to release muscle tension. ? Visualizing peaceful scenes.  Cut back on  alcohol, caffeinated beverages, and cigarettes, especially close to bedtime. These can disrupt your sleep.  Do not overeat or eat spicy foods right before bedtime. This can lead to digestive discomfort that can make it hard for you to sleep.  Limit screen use before bedtime.  This includes: ? Watching TV. ? Using your smartphone, tablet, and computer.  Stick to a routine. This can help you fall asleep faster. Try to do a quiet activity, brush your teeth, and go to bed at the same time each night.  Get out of bed if you are still awake after 15 minutes of trying to sleep. Keep the lights down, but try reading or doing a quiet activity. When you feel sleepy, go back to bed.  Make sure that you drive carefully. Avoid driving if you feel very sleepy.  Keep all follow-up appointments as directed by your health care provider. This is important. Contact a health care provider if:  You are tired throughout the day or have trouble in your daily routine due to sleepiness.  You continue to have sleep problems or your sleep problems get worse. Get help right away if:  You have serious thoughts about hurting yourself or someone else. This information is not intended to replace advice given to you by your health care provider. Make sure you discuss any questions you have with your health care provider. Document Released: 03/04/2000 Document Revised: 08/07/2015 Document Reviewed: 12/06/2013 Elsevier Interactive Patient Education  [employer].

## 2017-02-23 ENCOUNTER — Ambulatory Visit: Payer: BLUE CROSS/BLUE SHIELD | Admitting: Family Medicine

## 2017-03-16 ENCOUNTER — Ambulatory Visit: Payer: BLUE CROSS/BLUE SHIELD | Admitting: Family Medicine

## 2017-05-26 ENCOUNTER — Encounter: Payer: Self-pay | Admitting: Family Medicine

## 2017-05-26 ENCOUNTER — Ambulatory Visit (INDEPENDENT_AMBULATORY_CARE_PROVIDER_SITE_OTHER): Payer: Managed Care, Other (non HMO) | Admitting: Family Medicine

## 2017-05-26 ENCOUNTER — Ambulatory Visit: Payer: BLUE CROSS/BLUE SHIELD | Admitting: Family Medicine

## 2017-05-26 ENCOUNTER — Other Ambulatory Visit: Payer: Self-pay

## 2017-05-26 VITALS — BP 134/84 | HR 75 | Temp 98.4°F | Ht 72.0 in | Wt 199.5 lb

## 2017-05-26 DIAGNOSIS — R9431 Abnormal electrocardiogram [ECG] [EKG]: Secondary | ICD-10-CM

## 2017-05-26 DIAGNOSIS — K219 Gastro-esophageal reflux disease without esophagitis: Secondary | ICD-10-CM | POA: Diagnosis not present

## 2017-05-26 DIAGNOSIS — R079 Chest pain, unspecified: Secondary | ICD-10-CM

## 2017-05-26 DIAGNOSIS — E785 Hyperlipidemia, unspecified: Secondary | ICD-10-CM

## 2017-05-26 DIAGNOSIS — I10 Essential (primary) hypertension: Secondary | ICD-10-CM | POA: Diagnosis not present

## 2017-05-26 NOTE — Progress Notes (Signed)
Chief Complaint  Patient presents with  . Follow-up    rib pain, chest pain when moving    Patient is here for routine follow-up. He is compliant with his blood pressure medication and his blood pressure is well controlled. He has a new complaint of chest pain.  He states it feels like a "pressure".  He points to his mid sternum.  He states it is worse with heavy activity and better with rest.  No nausea.  No diaphoresis.  No palpitations.  He states it is brief and goes away with rest.  No tenderness to the chest wall.  No coughing or shortness of breath. He has hyperlipidemia.  He has declined going on a statin.  He agreed to try diet and get his lipids repeated in 3 months.  He is going to get the lipids done today. No swelling of the ankles.  No dyspnea on exertion.  No history of heart disease of any kind. GERD well controlled on Protonix.  Patient Active Problem List   Diagnosis Date Noted  . Primary osteoarthritis of right knee 02/21/2017  . Primary insomnia 02/21/2017  . Diverticulosis 12/15/2016  . Tubular adenoma of colon 11/14/2016  . HLD (hyperlipidemia) 07/15/2016  . Encounter for screening colonoscopy 07/04/2016  . Noncompliance with medications 06/08/2016  . Tobacco dipper 06/08/2016  . GERD without esophagitis 06/08/2016  . Monocular visual disturbance 06/08/2016  . Arthritis of left hip 05/13/2014  . Essential hypertension 10/14/2013    Outpatient Encounter Medications as of 05/26/2017  Medication Sig  . benazepril (LOTENSIN) 10 MG tablet Take 2 tablets (20 mg total) by mouth daily.  . Pantoprazole Sodium (PROTONIX PO) Take by mouth.  . cholecalciferol (VITAMIN D) 1000 units tablet Take 2,000 Units by mouth daily.  Marland Kitchen ibuprofen (ADVIL,MOTRIN) 800 MG tablet Take 1 tablet (800 mg total) by mouth every 8 (eight) hours as needed for moderate pain. TAKE WITH FOOD (Patient not taking: Reported on 05/26/2017)   No facility-administered encounter medications on file as of  05/26/2017.     Allergies  Allergen Reactions  . Tramadol Other (See Comments)    Patient states "sweats"    Review of Systems  Constitutional: Negative for activity change, appetite change and fatigue.  HENT: Negative for congestion and dental problem.   Eyes: Negative for visual disturbance.  Respiratory: Negative for cough and shortness of breath.   Cardiovascular: Positive for chest pain. Negative for palpitations.  Gastrointestinal: Negative for abdominal pain, blood in stool and constipation.  Genitourinary: Negative for difficulty urinating and dysuria.  Musculoskeletal: Negative for arthralgias and myalgias.  Neurological: Negative for dizziness and headaches.  Psychiatric/Behavioral: Negative for dysphoric mood and sleep disturbance. The patient is not nervous/anxious.     BP 134/84 (BP Location: Right Arm, Patient Position: Supine)   Pulse 75   Temp 98.4 F (36.9 C) (Temporal)   Ht 6' (1.829 m)   Wt 199 lb 8 oz (90.5 kg)   SpO2 98%   BMI 27.06 kg/m   Physical Exam  Constitutional: He is oriented to person, place, and time. He appears well-developed and well-nourished.  HENT:  Head: Normocephalic and atraumatic.  Mouth/Throat: Oropharynx is clear and moist.  Eyes: Conjunctivae are normal. Pupils are equal, round, and reactive to light.  Neck: Normal range of motion. Neck supple. No thyromegaly present.  Cardiovascular: Normal rate, regular rhythm and normal heart sounds.  Pulmonary/Chest: Effort normal and breath sounds normal. No respiratory distress.  Musculoskeletal: Normal range of motion.  He exhibits no edema.  Right knee exam normal  Neurological: He is alert and oriented to person, place, and time. He displays normal reflexes.  Gait normal  Skin: Skin is warm and dry.  Psychiatric: He has a normal mood and affect. His behavior is normal. Thought content normal.  Nursing note and vitals reviewed.  EKG_early LVH.  Nonspecific ST  changes. ASSESSMENT/PLAN:  1. Essential hypertension Controlled  2. Hyperlipidemia, unspecified hyperlipidemia type Uncontrolled.  Will check lipid panel  3. Chest pain, unspecified type Not typical for angina, but with an abnormal EKG and cardiac risk factors I believe a referral to cardiology as appropriate.  4. GERD without esophagitis Controlled with Protonix  5. Abnormal EKG Discussed   Patient Instructions  Continue to take the blood pressure medicine every day Eat a heart healthy diet Need lab test today to check the cholesterol I will notify you of your test result  Your EKG shows some heart strain from the high blood pressure I have referred you to a cardiologist for consultation  Return in 6 months You will see Dr Mannie Stabile next time    [place] stands for "Dietary Approaches to Stop Hypertension." The DASH eating plan is a healthy eating plan that has been shown to reduce high blood pressure (hypertension). It may also reduce your risk for type 2 diabetes, heart disease, and stroke. The DASH eating plan may also help with weight loss. What are tips for following this plan? General guidelines  Avoid eating more than 2,300 mg (milligrams) of salt (sodium) a day. If you have hypertension, you may need to reduce your sodium intake to 1,500 mg a day.  Limit alcohol intake to no more than 1 drink a day for nonpregnant women and 2 drinks a day for men. One drink equals 12 oz of beer, 5 oz of wine, or 1 oz of hard liquor.  Work with your health care provider to maintain a healthy body weight or to lose weight. Ask what an ideal weight is for you.  Get at least 30 minutes of exercise that causes your heart to beat faster (aerobic exercise) most days of the week. Activities may include walking, swimming, or biking. Reading food labels  Check food labels for the amount of sodium per serving. Choose foods with less than 5 percent of the Daily Value of sodium.  Generally, foods with less than 300 mg of sodium per serving fit into this eating plan.  To find whole grains, look for the word "whole" as the first word in the ingredient list. Shopping  Buy products labeled as "low-sodium" or "no salt added."  Buy fresh foods. Avoid canned foods and premade or frozen meals. Cooking  Avoid adding salt when cooking. Use salt-free seasonings or herbs instead of table salt or sea salt. Check with your health care provider or pharmacist before using salt substitutes.  Do not fry foods. Cook foods using healthy methods such as baking, boiling, grilling, and broiling instead.  Cook with heart-healthy oils, such as olive, canola, soybean, or sunflower oil. Meal planning   Eat a balanced diet that includes: ? 5 or more servings of fruits and vegetables each day. At each meal, try to fill half of your plate with fruits and vegetables. ? Up to 6-8 servings of whole grains each day. ? Less than 6 oz of lean meat, poultry, or fish each day. A 3-oz serving of meat is about the same size as a deck of cards.  One egg equals 1 oz. ? 2 servings of low-fat dairy each day. ? A serving of nuts, seeds, or beans 5 times each week. ? Heart-healthy fats. Healthy fats called Omega-3 fatty acids are found in foods such as flaxseeds and coldwater fish, like sardines, salmon, and mackerel.  Limit how much you eat of the following: ? Canned or prepackaged foods. ? Food that is high in trans fat, such as fried foods. ? Food that is high in saturated fat, such as fatty meat. ? Sweets, desserts, sugary drinks, and other foods with added sugar. ? Full-fat dairy products.  Do not salt foods before eating.  Try to eat at least 2 vegetarian meals each week.  Eat more home-cooked food and less restaurant, buffet, and fast food.  When eating at a restaurant, ask that your food be prepared with less salt or no salt, if possible. What foods are recommended? The items listed may  not be a complete list. Talk with your dietitian about what dietary choices are best for you. Grains Whole-grain or whole-wheat bread. Whole-grain or whole-wheat pasta. Brown rice. [place]. Bulgur. Whole-grain and low-sodium cereals. Pita bread. Low-fat, low-sodium crackers. Whole-wheat flour tortillas. Vegetables Fresh or frozen vegetables (raw, steamed, roasted, or grilled). Low-sodium or reduced-sodium tomato and vegetable juice. Low-sodium or reduced-sodium tomato sauce and tomato paste. Low-sodium or reduced-sodium canned vegetables. Fruits All fresh, dried, or frozen fruit. Canned fruit in natural juice (without added sugar). Meat and other protein foods Skinless chicken or [place]. Ground chicken or [place]. Pork with fat trimmed off. Fish and seafood. Egg whites. Dried beans, peas, or lentils. Unsalted nuts, nut butters, and seeds. Unsalted canned beans. Lean cuts of beef with fat trimmed off. Low-sodium, lean deli meat. Dairy Low-fat (1%) or fat-free (skim) milk. Fat-free, low-fat, or reduced-fat cheeses. Nonfat, low-sodium ricotta or cottage cheese. Low-fat or nonfat yogurt. Low-fat, low-sodium cheese. Fats and oils Soft margarine without trans fats. Vegetable oil. Low-fat, reduced-fat, or light mayonnaise and salad dressings (reduced-sodium). Canola, safflower, olive, soybean, and sunflower oils. Avocado. Seasoning and other foods Herbs. Spices. Seasoning mixes without salt. Unsalted popcorn and pretzels. Fat-free sweets. What foods are not recommended? The items listed may not be a complete list. Talk with your dietitian about what dietary choices are best for you. Grains Baked goods made with fat, such as croissants, muffins, or some breads. Dry pasta or rice meal packs. Vegetables Creamed or fried vegetables. Vegetables in a cheese sauce. Regular canned vegetables (not low-sodium or reduced-sodium). Regular canned tomato sauce and paste (not low-sodium or reduced-sodium).  Regular tomato and vegetable juice (not low-sodium or reduced-sodium). Angie Fava. Olives. Fruits Canned fruit in a light or heavy syrup. Fried fruit. Fruit in cream or butter sauce. Meat and other protein foods Fatty cuts of meat. Ribs. Fried meat. Berniece Salines. Sausage. Bologna and other processed lunch meats. Salami. Fatback. Hotdogs. Bratwurst. Salted nuts and seeds. Canned beans with added salt. Canned or smoked fish. Whole eggs or egg yolks. Chicken or [place] with skin. Dairy Whole or 2% milk, cream, and half-and-half. Whole or full-fat cream cheese. Whole-fat or sweetened yogurt. Full-fat cheese. Nondairy creamers. Whipped toppings. Processed cheese and cheese spreads. Fats and oils Butter. Stick margarine. Lard. Shortening. Ghee. Bacon fat. Tropical oils, such as coconut, palm kernel, or palm oil. Seasoning and other foods Salted popcorn and pretzels. Onion salt, garlic salt, seasoned salt, table salt, and sea salt. Worcestershire sauce. Tartar sauce. Barbecue sauce. Teriyaki sauce. Soy sauce, including reduced-sodium. Steak sauce. Canned and packaged gravies.  Fish sauce. Oyster sauce. Cocktail sauce. Horseradish that you find on the shelf. Ketchup. Mustard. Meat flavorings and tenderizers. Bouillon cubes. Hot sauce and Tabasco sauce. Premade or packaged marinades. Premade or packaged taco seasonings. Relishes. Regular salad dressings. Where to find more information:  National Heart, Lung, and [place]: https://wilson-eaton.com/  American Heart Association: www.heart.org Summary  The DASH eating plan is a healthy eating plan that has been shown to reduce high blood pressure (hypertension). It may also reduce your risk for type 2 diabetes, heart disease, and stroke.  With the DASH eating plan, you should limit salt (sodium) intake to 2,300 mg a day. If you have hypertension, you may need to reduce your sodium intake to 1,500 mg a day.  When on the DASH eating plan, aim to eat more fresh fruits and  vegetables, whole grains, lean proteins, low-fat dairy, and heart-healthy fats.    Raylene Everts, MD

## 2017-05-26 NOTE — Patient Instructions (Addendum)
Continue to take the blood pressure medicine every day Eat a heart healthy diet Need lab test today to check the cholesterol I will notify you of your test result  Your EKG shows some heart strain from the high blood pressure I have referred you to a cardiologist for consultation  Return in 6 months You will see Dr Mannie Stabile next time    [place] stands for "Dietary Approaches to Stop Hypertension." The DASH eating plan is a healthy eating plan that has been shown to reduce high blood pressure (hypertension). It may also reduce your risk for type 2 diabetes, heart disease, and stroke. The DASH eating plan may also help with weight loss. What are tips for following this plan? General guidelines  Avoid eating more than 2,300 mg (milligrams) of salt (sodium) a day. If you have hypertension, you may need to reduce your sodium intake to 1,500 mg a day.  Limit alcohol intake to no more than 1 drink a day for nonpregnant women and 2 drinks a day for men. One drink equals 12 oz of beer, 5 oz of wine, or 1 oz of hard liquor.  Work with your health care provider to maintain a healthy body weight or to lose weight. Ask what an ideal weight is for you.  Get at least 30 minutes of exercise that causes your heart to beat faster (aerobic exercise) most days of the week. Activities may include walking, swimming, or biking. Reading food labels  Check food labels for the amount of sodium per serving. Choose foods with less than 5 percent of the Daily Value of sodium. Generally, foods with less than 300 mg of sodium per serving fit into this eating plan.  To find whole grains, look for the word "whole" as the first word in the ingredient list. Shopping  Buy products labeled as "low-sodium" or "no salt added."  Buy fresh foods. Avoid canned foods and premade or frozen meals. Cooking  Avoid adding salt when cooking. Use salt-free seasonings or herbs instead of table salt or sea salt. Check  with your health care provider or pharmacist before using salt substitutes.  Do not fry foods. Cook foods using healthy methods such as baking, boiling, grilling, and broiling instead.  Cook with heart-healthy oils, such as olive, canola, soybean, or sunflower oil. Meal planning   Eat a balanced diet that includes: ? 5 or more servings of fruits and vegetables each day. At each meal, try to fill half of your plate with fruits and vegetables. ? Up to 6-8 servings of whole grains each day. ? Less than 6 oz of lean meat, poultry, or fish each day. A 3-oz serving of meat is about the same size as a deck of cards. One egg equals 1 oz. ? 2 servings of low-fat dairy each day. ? A serving of nuts, seeds, or beans 5 times each week. ? Heart-healthy fats. Healthy fats called Omega-3 fatty acids are found in foods such as flaxseeds and coldwater fish, like sardines, salmon, and mackerel.  Limit how much you eat of the following: ? Canned or prepackaged foods. ? Food that is high in trans fat, such as fried foods. ? Food that is high in saturated fat, such as fatty meat. ? Sweets, desserts, sugary drinks, and other foods with added sugar. ? Full-fat dairy products.  Do not salt foods before eating.  Try to eat at least 2 vegetarian meals each week.  Eat more home-cooked food and less restaurant,  buffet, and fast food.  When eating at a restaurant, ask that your food be prepared with less salt or no salt, if possible. What foods are recommended? The items listed may not be a complete list. Talk with your dietitian about what dietary choices are best for you. Grains Whole-grain or whole-wheat bread. Whole-grain or whole-wheat pasta. Brown rice. [place]. Bulgur. Whole-grain and low-sodium cereals. Pita bread. Low-fat, low-sodium crackers. Whole-wheat flour tortillas. Vegetables Fresh or frozen vegetables (raw, steamed, roasted, or grilled). Low-sodium or reduced-sodium tomato and  vegetable juice. Low-sodium or reduced-sodium tomato sauce and tomato paste. Low-sodium or reduced-sodium canned vegetables. Fruits All fresh, dried, or frozen fruit. Canned fruit in natural juice (without added sugar). Meat and other protein foods Skinless chicken or [place]. Ground chicken or [place]. Pork with fat trimmed off. Fish and seafood. Egg whites. Dried beans, peas, or lentils. Unsalted nuts, nut butters, and seeds. Unsalted canned beans. Lean cuts of beef with fat trimmed off. Low-sodium, lean deli meat. Dairy Low-fat (1%) or fat-free (skim) milk. Fat-free, low-fat, or reduced-fat cheeses. Nonfat, low-sodium ricotta or cottage cheese. Low-fat or nonfat yogurt. Low-fat, low-sodium cheese. Fats and oils Soft margarine without trans fats. Vegetable oil. Low-fat, reduced-fat, or light mayonnaise and salad dressings (reduced-sodium). Canola, safflower, olive, soybean, and sunflower oils. Avocado. Seasoning and other foods Herbs. Spices. Seasoning mixes without salt. Unsalted popcorn and pretzels. Fat-free sweets. What foods are not recommended? The items listed may not be a complete list. Talk with your dietitian about what dietary choices are best for you. Grains Baked goods made with fat, such as croissants, muffins, or some breads. Dry pasta or rice meal packs. Vegetables Creamed or fried vegetables. Vegetables in a cheese sauce. Regular canned vegetables (not low-sodium or reduced-sodium). Regular canned tomato sauce and paste (not low-sodium or reduced-sodium). Regular tomato and vegetable juice (not low-sodium or reduced-sodium). Angie Fava. Olives. Fruits Canned fruit in a light or heavy syrup. Fried fruit. Fruit in cream or butter sauce. Meat and other protein foods Fatty cuts of meat. Ribs. Fried meat. Berniece Salines. Sausage. Bologna and other processed lunch meats. Salami. Fatback. Hotdogs. Bratwurst. Salted nuts and seeds. Canned beans with added salt. Canned or smoked fish. Whole eggs or  egg yolks. Chicken or [place] with skin. Dairy Whole or 2% milk, cream, and half-and-half. Whole or full-fat cream cheese. Whole-fat or sweetened yogurt. Full-fat cheese. Nondairy creamers. Whipped toppings. Processed cheese and cheese spreads. Fats and oils Butter. Stick margarine. Lard. Shortening. Ghee. Bacon fat. Tropical oils, such as coconut, palm kernel, or palm oil. Seasoning and other foods Salted popcorn and pretzels. Onion salt, garlic salt, seasoned salt, table salt, and sea salt. Worcestershire sauce. Tartar sauce. Barbecue sauce. Teriyaki sauce. Soy sauce, including reduced-sodium. Steak sauce. Canned and packaged gravies. Fish sauce. Oyster sauce. Cocktail sauce. Horseradish that you find on the shelf. Ketchup. Mustard. Meat flavorings and tenderizers. Bouillon cubes. Hot sauce and Tabasco sauce. Premade or packaged marinades. Premade or packaged taco seasonings. Relishes. Regular salad dressings. Where to find more information:  National Heart, Lung, and [place]: https://wilson-eaton.com/  American Heart Association: www.heart.org Summary  The DASH eating plan is a healthy eating plan that has been shown to reduce high blood pressure (hypertension). It may also reduce your risk for type 2 diabetes, heart disease, and stroke.  With the DASH eating plan, you should limit salt (sodium) intake to 2,300 mg a day. If you have hypertension, you may need to reduce your sodium intake to 1,500 mg a day.  When  on the DASH eating plan, aim to eat more fresh fruits and vegetables, whole grains, lean proteins, low-fat dairy, and heart-healthy fats.

## 2017-06-05 ENCOUNTER — Encounter (HOSPITAL_COMMUNITY): Payer: Self-pay | Admitting: Emergency Medicine

## 2017-06-05 ENCOUNTER — Emergency Department (HOSPITAL_COMMUNITY)
Admission: EM | Admit: 2017-06-05 | Discharge: 2017-06-05 | Disposition: A | Discharge status: Home / Self Care | Payer: Managed Care, Other (non HMO) | Attending: Emergency Medicine | Admitting: Emergency Medicine

## 2017-06-05 DIAGNOSIS — I159 Secondary hypertension, unspecified: Secondary | ICD-10-CM | POA: Diagnosis not present

## 2017-06-05 DIAGNOSIS — K625 Hemorrhage of anus and rectum: Secondary | ICD-10-CM | POA: Diagnosis not present

## 2017-06-05 DIAGNOSIS — F17290 Nicotine dependence, other tobacco product, uncomplicated: Secondary | ICD-10-CM | POA: Diagnosis not present

## 2017-06-05 LAB — COMPREHENSIVE METABOLIC PANEL
ALK PHOS: 65 U/L (ref 38–126)
ALT: 29 U/L (ref 17–63)
AST: 30 U/L (ref 15–41)
Albumin: 4.4 g/dL (ref 3.5–5.0)
Anion gap: 11 (ref 5–15)
BILIRUBIN TOTAL: 1 mg/dL (ref 0.3–1.2)
BUN: 13 mg/dL (ref 6–20)
CHLORIDE: 102 mmol/L (ref 101–111)
CO2: 26 mmol/L (ref 22–32)
CREATININE: 1.27 mg/dL — AB (ref 0.61–1.24)
Calcium: 9.2 mg/dL (ref 8.9–10.3)
GFR calc Af Amer: >60 mL/min (ref >60)
Glucose, Bld: 92 mg/dL (ref 65–99)
Potassium: 3.6 mmol/L (ref 3.5–5.1)
Sodium: 139 mmol/L (ref 135–145)
TOTAL PROTEIN: 8.4 g/dL — AB (ref 6.5–8.1)

## 2017-06-05 LAB — CBC
HCT: 45.6 % (ref 39.0–52.0)
Hemoglobin: 15.7 g/dL (ref 13.0–17.0)
MCH: 31.4 pg (ref 26.0–34.0)
MCHC: 34.4 g/dL (ref 30.0–36.0)
MCV: 91.2 fL (ref 78.0–100.0)
PLATELETS: 239 10*3/uL (ref 150–400)
RBC: 5 MIL/uL (ref 4.22–5.81)
RDW: 13.5 % (ref 11.5–15.5)
WBC: 5 10*3/uL (ref 4.0–10.5)

## 2017-06-05 LAB — POC OCCULT BLOOD, ED: Fecal Occult Bld: POSITIVE — AB

## 2017-06-05 MED ORDER — HYDROCORTISONE ACETATE 25 MG RE SUPP: 25.0000 mg | Freq: Two times a day (BID) | RECTAL | 0 refills | Status: DC

## 2017-06-05 NOTE — ED Triage Notes (Signed)
Pt reports dark red blood in stool since last Monday.  States it is not enough to fill the toilet, but when wiping and noted in stool.

## 2017-06-05 NOTE — ED Provider Notes (Signed)
Emergency Department Provider Note   I have reviewed the triage vital signs and the nursing notes.   HISTORY  Chief Complaint Rectal Bleeding   HPI Corey Thomas is a 52 y.o. male she of hypertension presents to the emergency department today secondary to bright red blood on his toilet paper and stools.  This happen intermittently for the last week and 1/2-2 weeks.  Sometimes on the outside of his stool and sometimes on the toilet paper.  He has had anal fissures before with a similar presentation.  His last colonoscopy was 6 months ago and was normal.  Patient does have any pain.  No constipation.  No other associated symptoms.  No fevers.  No history of colon cancer or inflammatory bowel disease. No other associated or modifying symptoms.    Past Medical History:  Diagnosis Date  . Arthritis   . GERD without esophagitis 06/08/2016  . HLD (hyperlipidemia) 07/15/2016  . Hypertension   . Vitamin D deficiency     Patient Active Problem List   Diagnosis Date Noted  . Primary osteoarthritis of right knee 02/21/2017  . Primary insomnia 02/21/2017  . Diverticulosis 12/15/2016  . Tubular adenoma of colon 11/14/2016  . HLD (hyperlipidemia) 07/15/2016  . Encounter for screening colonoscopy 07/04/2016  . Noncompliance with medications 06/08/2016  . Tobacco dipper 06/08/2016  . GERD without esophagitis 06/08/2016  . Monocular visual disturbance 06/08/2016  . Arthritis of left hip 05/13/2014  . Essential hypertension 10/14/2013    Past Surgical History:  Procedure Laterality Date  . COLONOSCOPY N/A 11/11/2016   Procedure: COLONOSCOPY;  Surgeon: Rogene Houston, MD;  Location: [facility];  Service: Endoscopy;  Laterality: N/A;  12:55-rescheduled to 8/24 @ 10:25am per Lelon Frohlich  . NO PAST SURGERIES      Current Outpatient Rx  . Order #: [id number] Class: Normal  . Order #: [id number] Class: Historical Med  . Order #: [id number] Class: Print  . Order #: [id number] Class: Normal  .  Order #: [id number] Class: Historical Med    Allergies Tramadol  Family History  Problem Relation Age of Onset  . Healthy Sister   . Healthy Sister   . Alcohol abuse Brother 41       cirrhosis  . Diabetes Other   . Osteoarthritis Other   . Arthritis Maternal Grandmother     Social History Social History   Tobacco Use  . Smoking status: Never Smoker  . Smokeless tobacco: Current User    Types: Snuff  Substance Use Topics  . Alcohol use: Yes    Frequency: Never    Comment: occasional  . Drug use: No    Review of Systems  All other systems negative except as documented in the HPI. All pertinent positives and negatives as reviewed in the HPI. ____________________________________________   PHYSICAL EXAM:  VITAL SIGNS: ED Triage Vitals  Enc Vitals Group     BP 06/05/17 1216 (!) 176/114     Pulse Rate 06/05/17 1216 68     Resp 06/05/17 1216 16     Temp 06/05/17 1216 98.3 F (36.8 C)     Temp Source 06/05/17 1216 Oral     SpO2 06/05/17 1216 99 %     Weight 06/05/17 1217 199 lb (90.3 kg)     Height 06/05/17 1217 6' (1.829 m)    Constitutional: Alert and oriented. Well appearing and in no acute distress. Eyes: Conjunctivae are normal. PERRL. EOMI. Head: Atraumatic. Nose: No congestion/rhinnorhea. Mouth/Throat: Mucous membranes are moist.  Oropharynx non-erythematous.  Neck: No stridor.  No meningeal signs.   Cardiovascular: Normal rate, regular rhythm. Good peripheral circulation. Grossly normal heart sounds.   Respiratory: Normal respiratory effort.  No retractions. Lungs CTAB. Gastrointestinal: Soft and nontender. No distention.  Rectal:  No fissures, no hemorrhiods, hemoccult positive, normal firm prostate. Musculoskeletal: No lower extremity tenderness nor edema. No gross deformities of extremities. Neurologic:  Normal speech and language. No gross focal neurologic deficits are appreciated.  Skin:  Skin is warm, dry and intact. No rash  noted.   ____________________________________________   LABS (all labs ordered are listed, but only abnormal results are displayed)  Labs Reviewed  COMPREHENSIVE METABOLIC PANEL - Abnormal; Notable for the following components:      Result Value   Creatinine, Ser 1.27 (*)    Total Protein 8.4 (*)    All other components within normal limits  POC OCCULT BLOOD, ED - Abnormal; Notable for the following components:   Fecal Occult Bld POSITIVE (*)    All other components within normal limits  CBC  ____________________________________________   PROCEDURES  Procedure(s) performed:   Procedures   ____________________________________________   INITIAL IMPRESSION / ASSESSMENT AND PLAN / ED COURSE  Suspect distal bleed.  Patient refused anoscope and will follow up with his gastroneurologist.  No evidence of hemodynamic instability or acute blood loss anemia at this time.  Patient is stable for discharge to follow-up with his gastroenterologist for further workup and management.   Pertinent labs & imaging results that were available during my care of the patient were reviewed by me and considered in my medical decision making (see chart for details).  ____________________________________________  FINAL CLINICAL IMPRESSION(S) / ED DIAGNOSES  Final diagnoses:  Secondary hypertension  Rectal bleeding     MEDICATIONS GIVEN DURING THIS VISIT:  Medications - No data to display   NEW OUTPATIENT MEDICATIONS STARTED DURING THIS VISIT:  New Prescriptions   HYDROCORTISONE (ANUSOL-HC) 25 MG SUPPOSITORY    Place 1 suppository (25 mg total) rectally 2 (two) times daily. For 7 days    Note:  This note was prepared with assistance of Systems analyst. Occasional wrong-word or sound-a-like substitutions may have occurred due to the inherent limitations of voice recognition software.   Merrily Pew, MD 06/05/17 [phone]

## 2017-09-08 ENCOUNTER — Emergency Department (HOSPITAL_COMMUNITY)
Admission: EM | Admit: 2017-09-08 | Discharge: 2017-09-08 | Disposition: A | Discharge status: Home / Self Care | Payer: Managed Care, Other (non HMO) | Attending: Emergency Medicine | Admitting: Emergency Medicine

## 2017-09-08 ENCOUNTER — Other Ambulatory Visit: Payer: Self-pay

## 2017-09-08 ENCOUNTER — Emergency Department (HOSPITAL_COMMUNITY): Payer: Managed Care, Other (non HMO)

## 2017-09-08 ENCOUNTER — Encounter (HOSPITAL_COMMUNITY): Payer: Self-pay | Admitting: Student

## 2017-09-08 DIAGNOSIS — S8992XA Unspecified injury of left lower leg, initial encounter: Secondary | ICD-10-CM | POA: Diagnosis present

## 2017-09-08 DIAGNOSIS — F17220 Nicotine dependence, chewing tobacco, uncomplicated: Secondary | ICD-10-CM | POA: Insufficient documentation

## 2017-09-08 DIAGNOSIS — I10 Essential (primary) hypertension: Secondary | ICD-10-CM | POA: Insufficient documentation

## 2017-09-08 DIAGNOSIS — Y999 Unspecified external cause status: Secondary | ICD-10-CM | POA: Diagnosis not present

## 2017-09-08 DIAGNOSIS — W268XXA Contact with other sharp object(s), not elsewhere classified, initial encounter: Secondary | ICD-10-CM | POA: Insufficient documentation

## 2017-09-08 DIAGNOSIS — S81802A Unspecified open wound, left lower leg, initial encounter: Secondary | ICD-10-CM | POA: Diagnosis not present

## 2017-09-08 DIAGNOSIS — Y93H9 Activity, other involving exterior property and land maintenance, building and construction: Secondary | ICD-10-CM | POA: Diagnosis not present

## 2017-09-08 DIAGNOSIS — Y929 Unspecified place or not applicable: Secondary | ICD-10-CM | POA: Diagnosis not present

## 2017-09-08 DIAGNOSIS — Z79899 Other long term (current) drug therapy: Secondary | ICD-10-CM | POA: Diagnosis not present

## 2017-09-08 DIAGNOSIS — S81812A Laceration without foreign body, left lower leg, initial encounter: Secondary | ICD-10-CM

## 2017-09-08 MED ORDER — BACITRACIN ZINC 500 UNIT/GM EX OINT
TOPICAL_OINTMENT | Freq: Once | CUTANEOUS | Status: AC
  Administered 2017-09-08: 1 via TOPICAL

## 2017-09-08 NOTE — Discharge Instructions (Addendum)
You were seen in the emergency department for a wound to your left knee.  The x-ray and my exam did not show evidence of any retained metal in the cut.  Please keep this clean and dry.  Apply antibiotic ointment and a bandage as needed.  Follow-up with your primary care provider in 1 week.  Return to the ER if the area starts to have drainage, redness around it, or you develop fevers, or any other concerns.\   Additionally your blood pressure was elevated in the emergency department today.  Please take your blood pressure medication as prescribed and have this rechecked by her primary care provider. Vitals:   09/08/17 0946  BP: (!) 170/104  Pulse: 71  Resp: 18  Temp: 98.2 F (36.8 C)  SpO2: 100%

## 2017-09-08 NOTE — ED Provider Notes (Signed)
[facility] DEPT Provider Note   CSN: [id number] Arrival date & time: 09/08/17  0930     History   Chief Complaint Chief Complaint  Patient presents with  . Laceration    HPI Corey Thomas is a 52 y.o. male.with a hx of hyperlipidemia, HTN, and arthritis who presents to the ED for laceration to L knee which occurred at 08:30 this AM. Patient states that he was holding a blade to a lawn mower, he was sharpening it, and accidentally dropped it. The blade grazed his L medial knee. States that the cleaned the area and applied abx ointment. He is here due to concern for possible metal retained FB, does not feel like there is one there, he states he just wants to be sure. Denies numbness, weakness, or pain. Last tetanus was  2 years ago.  Patient has hx of HTN, did not take BP meds this AM, states he forgot.   HPI  Past Medical History:  Diagnosis Date  . Arthritis   . GERD without esophagitis 06/08/2016  . HLD (hyperlipidemia) 07/15/2016  . Hypertension   . Vitamin D deficiency     Patient Active Problem List   Diagnosis Date Noted  . Primary osteoarthritis of right knee 02/21/2017  . Primary insomnia 02/21/2017  . Diverticulosis 12/15/2016  . Tubular adenoma of colon 11/14/2016  . HLD (hyperlipidemia) 07/15/2016  . Encounter for screening colonoscopy 07/04/2016  . Noncompliance with medications 06/08/2016  . Tobacco dipper 06/08/2016  . GERD without esophagitis 06/08/2016  . Monocular visual disturbance 06/08/2016  . Arthritis of left hip 05/13/2014  . Essential hypertension 10/14/2013    Past Surgical History:  Procedure Laterality Date  . COLONOSCOPY N/A 11/11/2016   Procedure: COLONOSCOPY;  Surgeon: Rogene Houston, MD;  Location: [facility];  Service: Endoscopy;  Laterality: N/A;  12:55-rescheduled to 8/24 @ 10:25am per Lelon Frohlich  . NO PAST SURGERIES          Home Medications    Prior to Admission medications   Medication Sig  Start Date End Date Taking? Authorizing Provider  benazepril (LOTENSIN) 10 MG tablet Take 2 tablets (20 mg total) by mouth daily. 06/29/16   Raylene Everts, MD  cholecalciferol (VITAMIN D) 1000 units tablet Take 2,000 Units by mouth daily.    [provider]  hydrocortisone (ANUSOL-HC) 25 MG suppository Place 1 suppository (25 mg total) rectally 2 (two) times daily. For 7 days 06/05/17   Mesner, Corene Cornea, MD  ibuprofen (ADVIL,MOTRIN) 800 MG tablet Take 1 tablet (800 mg total) by mouth every 8 (eight) hours as needed for moderate pain. TAKE WITH FOOD Patient not taking: Reported on 05/26/2017 02/21/17   Raylene Everts, MD  Pantoprazole Sodium (PROTONIX PO) Take by mouth.    [provider]    Family History Family History  Problem Relation Age of Onset  . Healthy Sister   . Healthy Sister   . Alcohol abuse Brother 41       cirrhosis  . Diabetes Other   . Osteoarthritis Other   . Arthritis Maternal Grandmother     Social History Social History   Tobacco Use  . Smoking status: Never Smoker  . Smokeless tobacco: Current User    Types: Snuff  Substance Use Topics  . Alcohol use: Yes    Frequency: Never    Comment: occasional  . Drug use: No     Allergies   Tramadol   Review of Systems Review of Systems  Constitutional: Negative for chills and fever.  Skin: Positive for wound.  Neurological: Negative for weakness and numbness.     Physical Exam Updated Vital Signs BP (!) 170/104 (BP Location: Right Arm)   Pulse 71   Temp 98.2 F (36.8 C) (Oral)   Resp 18   SpO2 100%   Physical Exam  Constitutional: He appears well-developed and well-nourished. No distress.  HENT:  Head: Normocephalic and atraumatic.  Eyes: Conjunctivae are normal. Right eye exhibits no discharge. Left eye exhibits no discharge.  Musculoskeletal:  No obvious deformity, appreciable swelling, erythema, ecchymosis, or overlying warmth.  Laceration per skin exam.  Lower  extremities: Patient has normal range of motion to bilateral knees and ankles.  Nontender to palpation.  Neurological: He is alert.  Clear speech.  Sensation grossly intact bilateral lower extremities.  5 5 strength plantar dorsiflexion bilaterally.  Gait steady and intact.  Skin:  Patient has a 1.5 cm length superficial skin tear type laceration to the medial aspect of the L knee. 1-52mm wide, 31mm depth.  No active bleeding.  No appreciable foreign body.  Psychiatric: He has a normal mood and affect. His behavior is normal. Thought content normal.  Nursing note and vitals reviewed.    ED Treatments / Results  Labs (all labs ordered are listed, but only abnormal results are displayed) Labs Reviewed - No data to display  EKG None  Radiology Dg Knee Complete 4 Views Left  Result Date: 09/08/2017 CLINICAL DATA:  Left leg laceration. EXAM: LEFT KNEE - COMPLETE 4+ VIEW COMPARISON:  No recent prior. FINDINGS: No acute bony or joint abnormality. No evidence of fracture or dislocation. IMPRESSION: No acute abnormality. Electronically Signed   By: Marcello Moores  Register   On: 09/08/2017 11:30    Procedures Procedures (including critical care time)  Medications Ordered in ED Medications - No data to display   Initial Impression / Assessment and Plan / ED Course  I have reviewed the triage vital signs and the nursing notes.  Pertinent labs & imaging results that were available during my care of the patient were reviewed by me and considered in my medical decision making (see chart for details).  Patient presents with fairly superficial skin tear type laceration to the left knee. Pressure irrigation performed. Wound explored and base of wound visualized in a bloodless field without evidence of foreign body- imaging confirmed absence of FB. This does not appear to be amenable to closure with sutures or skin glue given how superficial it is, patient in agreement and states he does not want stitches.   Tetanus is up to date. Abx ointment and bandage applied following irrigation. Additionally patient BP elevated in the ER today- did not take antihypertensive meds this AM- doubt HTN emergency, patient aware of need for compliance and recheck.  I discussed results, PCP follow-up, and return precautions with the patient. Provided opportunity for questions, patient confirmed understanding and is in agreement with plan.    Final Clinical Impressions(s) / ED Diagnoses   Final diagnoses:  Noninfected skin tear of left lower extremity, initial encounter    ED Discharge Orders    None       Leafy Kindle 09/08/17 1258    Hayden Rasmussen, MD 09/10/17 1104

## 2017-09-08 NOTE — ED Notes (Signed)
Discharge instructions reviewed with patient. Patient verbalizes understanding. VSS.   

## 2017-09-08 NOTE — ED Triage Notes (Signed)
Patient presents with laceration to left leg. Patient reports he was sharpening a piece of metal at work when he dropped it and it lacerated his left leg. Patient ambulatory to room. Patient reports controlled bleeding in triage. Patient reports he put antibiotic ointment on the laceration at the scene. Patient reports his last tetanus shot was in the last 2 years.  Patient states "Im just worried part of the metal broke off in my leg."

## 2017-12-01 ENCOUNTER — Ambulatory Visit: Payer: Managed Care, Other (non HMO) | Admitting: Family Medicine

## 2018-02-23 IMAGING — DX DG KNEE COMPLETE 4+V*R*
4 series · 4 of 4 positions shown · non-contrast
Comparison: None.

CLINICAL DATA: Right knee pain.

EXAM:
RIGHT KNEE - COMPLETE 4+ VIEW

[knee ap]
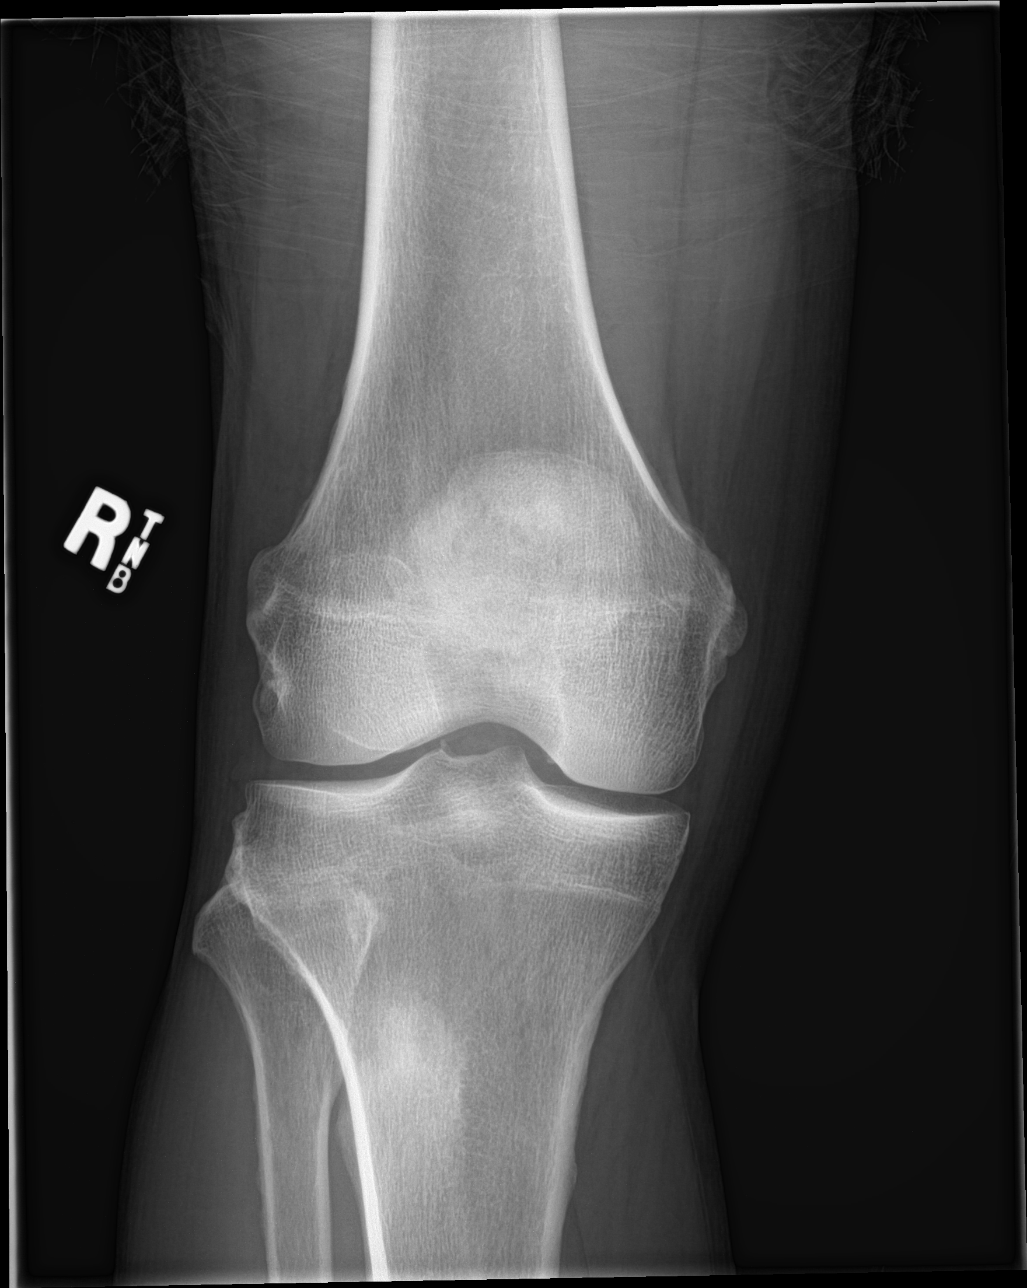

[knee obl (1 of 2)]
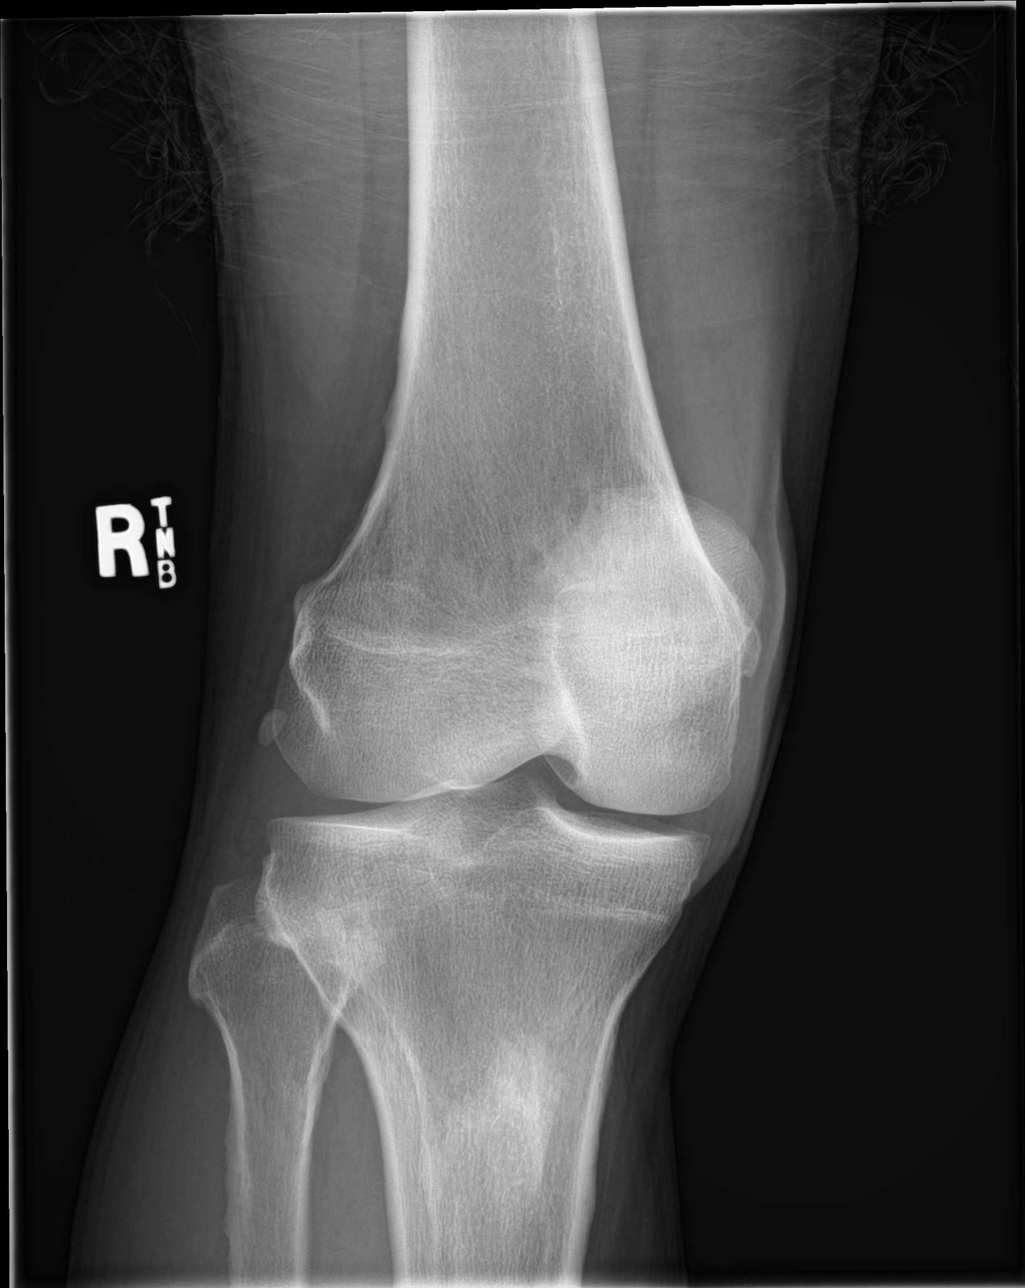

[knee obl (2 of 2)]
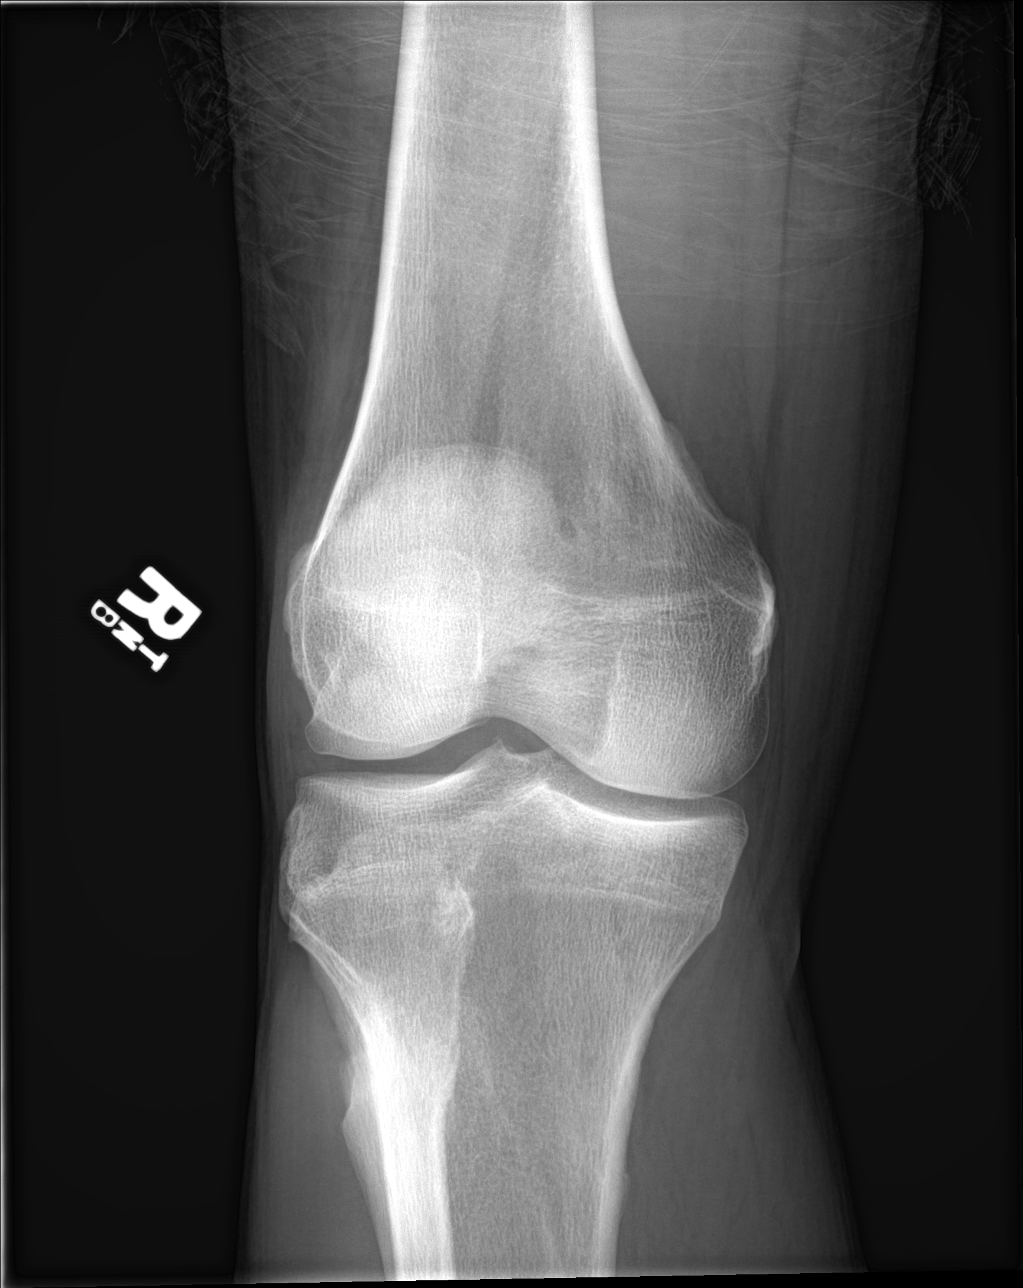

[knee lat]
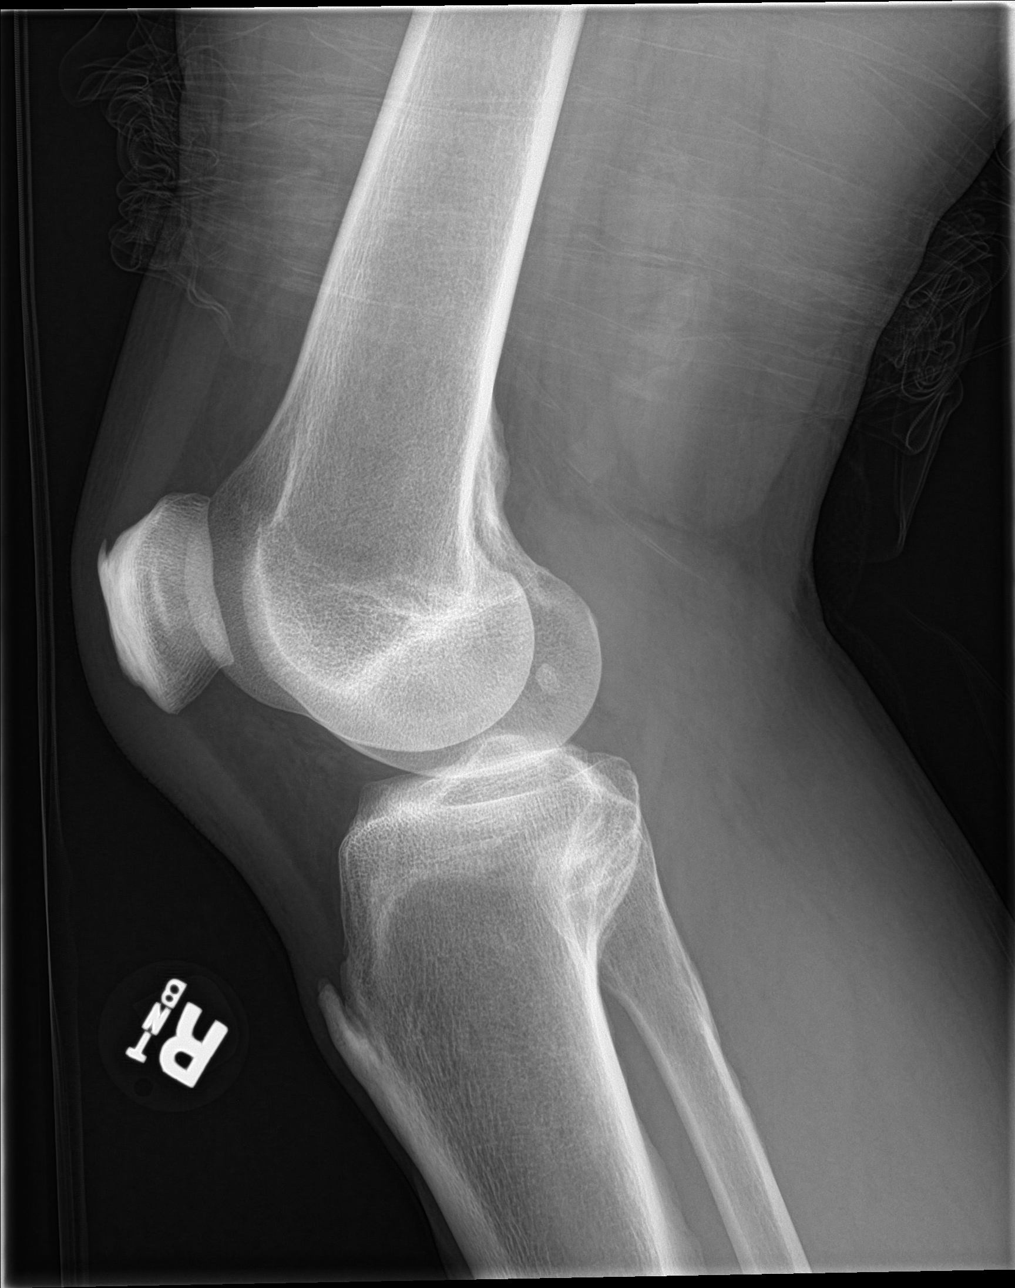

[4 of 4 positions shown; findings below may reference images not displayed]

FINDINGS: No evidence of fracture, dislocation, or joint effusion. No evidence
of arthropathy or other focal bone abnormality. Soft tissues are
unremarkable.
IMPRESSION: Negative.

## 2018-09-18 ENCOUNTER — Ambulatory Visit (INDEPENDENT_AMBULATORY_CARE_PROVIDER_SITE_OTHER): Payer: BC Managed Care – PPO | Admitting: Family Medicine

## 2018-09-18 ENCOUNTER — Encounter: Payer: Self-pay | Admitting: Family Medicine

## 2018-09-18 ENCOUNTER — Other Ambulatory Visit: Payer: Self-pay

## 2018-09-18 VITALS — BP 170/104 | HR 81 | Temp 98.0°F | Resp 15 | Ht 72.0 in | Wt 190.0 lb

## 2018-09-18 DIAGNOSIS — E663 Overweight: Secondary | ICD-10-CM

## 2018-09-18 DIAGNOSIS — K219 Gastro-esophageal reflux disease without esophagitis: Secondary | ICD-10-CM

## 2018-09-18 DIAGNOSIS — I10 Essential (primary) hypertension: Secondary | ICD-10-CM

## 2018-09-18 DIAGNOSIS — E785 Hyperlipidemia, unspecified: Secondary | ICD-10-CM

## 2018-09-18 DIAGNOSIS — E559 Vitamin D deficiency, unspecified: Secondary | ICD-10-CM

## 2018-09-18 MED ORDER — AMLODIPINE BESYLATE 5 MG PO TABS: 5.0000 mg | ORAL_TABLET | Freq: Every day | ORAL | 3 refills | Status: DC

## 2018-09-18 MED ORDER — PANTOPRAZOLE SODIUM 20 MG PO TBEC: 20.0000 mg | DELAYED_RELEASE_TABLET | Freq: Every day | ORAL | 0 refills | Status: DC

## 2018-09-18 MED ORDER — BENAZEPRIL HCL 20 MG PO TABS: 20.0000 mg | ORAL_TABLET | Freq: Every day | ORAL | 2 refills | Status: DC

## 2018-09-18 NOTE — Progress Notes (Signed)
Subjective:     Patient ID: Corey Thomas, male   DOB: 1965/08/31, 53 y.o.   MRN: [id number]  Corey Thomas presents for Establish Care, Hypertension (has very elevated BP and having to wear the mask all day without air on the job is causing him to have nosebleeds and shortness of breath ), and Insomnia (x 6 months. Hard to sleep at night and the only time he can go to sleep is when its time for work ) Corey Thomas is a 53 year old male patient who was deviously established with Dr. Meda Coffee in a year or so ago.  Has not been seen by anybody since he been seen by her.  Reports that he has not run out of his blood pressure medications at this time.  But his blood pressure is uncontrolled.  Denies having any headaches, vision changes, chest pain, chest tightness, leg swelling or dizziness or any other signs or symptoms of uncontrolled BP at this time.  He reports that he is having trouble with nosebleeds and shortness of breath which he thought was related to a mask.    Additionally he reports having some insomnia for the last 6 months.  He reports that he is exhausted when getting up for work.  But he also reports that he drinks a 12 pack of soda daily.  He drinks caffeine throughout the day.  Even before bed.  He reports that he falls asleep it is time for him to get up to go to work.  Denies trying anything over-the-counter for this at this time.  Has a history of vitamin D deficiency, hypertension, hyperlipidemia, GERD without esophagitis, arthritis. Reports that he does not have a candidate.  Reports that he occasionally drinks alcohol.  Denies any illicit or prescription drugs that are not his.  Reports sexually active, lifelong birth control.  Lives with wife [name], and 1 of her sons. All of his other children are grown.  He has 2 from previous marriage she had 4.  He has 1 granddaughter who comes to stay with him frequently.  He reports that he knows how to cook but he eats a lot of fried  food, too much salt and lots of fat.  In addition to drinking the caffeine as stated above.  Does not like to drink water.  Is not currently doing any form of exercise.  He denies knowing when his last tetanus was.  Denies having any HIV screening.  Reports that he gets his influenza vaccines as directed.  Reports that he got his colonoscopy back in 2018.  No other screening or immunizations are needed at this time.  Does need updated lab work as he reports a.m. and lab work since he will start seen by Dr. Meda Coffee.  Today patient denies signs and symptoms of COVID 19 infection including fever, chills, cough, shortness of breath, and headache.  Past Medical, Surgical, Social History, Allergies, and Medications have been Reviewed.    Past Medical History:  Diagnosis Date  . Arthritis   . GERD without esophagitis 06/08/2016  . HLD (hyperlipidemia) 07/15/2016  . Hypertension   . Vitamin D deficiency    Past Surgical History:  Procedure Laterality Date  . COLONOSCOPY N/A 11/11/2016   Procedure: COLONOSCOPY;  Surgeon: Rogene Houston, MD;  Location: [facility];  Service: Endoscopy;  Laterality: N/A;  12:55-rescheduled to 8/24 @ 10:25am per Lelon Frohlich  . NO PAST SURGERIES     Social History   Socioeconomic History  .  Marital status: Married    Spouse name: Not on file  . Number of children: 2  . Years of education: 78  . Highest education level: Not on file  Occupational History  . Occupation: CREW MEMBER    Employer: [employer]    Comment: landscaping  Social Needs  . Financial resource strain: Not on file  . Food insecurity    Worry: Not on file    Inability: Not on file  . Transportation needs    Medical: Not on file    Non-medical: Not on file  Tobacco Use  . Smoking status: Never Smoker  . Smokeless tobacco: Current User    Types: Snuff  . Tobacco comment: 1/2 can daily   Substance and Sexual Activity  . Alcohol use: Yes    Frequency: Never     Comment: occasional  . Drug use: No  . Sexual activity: Yes    Birth control/protection: None  Lifestyle  . Physical activity    Days per week: Not on file    Minutes per session: Not on file  . Stress: Not on file  Relationships  . Social Herbalist on phone: Not on file    Gets together: Not on file    Attends religious service: Not on file    Active member of club or organization: Not on file    Attends meetings of clubs or organizations: Not on file    Relationship status: Not on file  . Intimate partner violence    Fear of current or ex partner: Not on file    Emotionally abused: Not on file    Physically abused: Not on file    Forced sexual activity: Not on file  Other Topics Concern  . Not on file  Social History Narrative   Lives with wife [name]) July 30th 8 years    Son-Marties 17 step son   All other child grown   Grandchild-1 baby girl       Enjoys: sleep      Diet: fried foods (too much salt)   Caffeine: 12 pk daily    Water: Does not like water      Wears seat belt   Smoke and carbon monoxide detectors      Does not use phone while driving     Outpatient Encounter Medications as of 09/18/2018  Medication Sig  . benazepril (LOTENSIN) 10 MG tablet Take 2 tablets (20 mg total) by mouth daily.  . Pantoprazole Sodium (PROTONIX PO) Take 1 tablet by mouth daily as needed (stomach pain).   . [DISCONTINUED] cholecalciferol (VITAMIN D) 1000 units tablet Take 2,000 Units by mouth daily.  . [DISCONTINUED] HYDROcodone-acetaminophen (NORCO/VICODIN) 5-325 MG tablet Take 1 tablet by mouth every 6 (six) hours as needed for pain.   No facility-administered encounter medications on file as of 09/18/2018.    Allergies  Allergen Reactions  . Tramadol Other (See Comments)    Patient states "cramps"    Review of Systems  Constitutional: Negative for chills and fever.  HENT: Negative.   Eyes: Negative.   Respiratory: Positive for shortness of breath. Negative  for cough.   Cardiovascular: Negative for chest pain, palpitations and leg swelling.  Gastrointestinal: Negative.   Endocrine: Negative.  Negative for polydipsia, polyphagia and polyuria.  Genitourinary: Negative.   Musculoskeletal: Negative.   Skin: Negative.   Neurological: Negative for dizziness and headaches.  Psychiatric/Behavioral: Positive for sleep disturbance.  All other  systems reviewed and are negative.      Objective:     BP (!) 170/104   Pulse 81   Temp 98 F (36.7 C) (Temporal)   Resp 15   Ht 6' (1.829 m)   Wt 190 lb (86.2 kg)   SpO2 99%   BMI 25.77 kg/m   Physical Exam Vitals signs and nursing note reviewed.  Constitutional:      Appearance: Normal appearance. He is overweight.  HENT:     Head: Normocephalic and atraumatic.     Right Ear: External ear normal.     Left Ear: External ear normal.     Nose: Nose normal.  Eyes:     Conjunctiva/sclera: Conjunctivae normal.  Neck:     Musculoskeletal: Normal range of motion.  Cardiovascular:     Rate and Rhythm: Normal rate and regular rhythm.     Pulses: Normal pulses.     Heart sounds: Normal heart sounds.  Pulmonary:     Effort: Pulmonary effort is normal.     Breath sounds: Normal breath sounds.  Musculoskeletal: Normal range of motion.  Skin:    General: Skin is warm.     Capillary Refill: Capillary refill takes less than 2 seconds.  Neurological:     Mental Status: He is alert and oriented to person, place, and time.  Psychiatric:        Mood and Affect: Mood normal.        Behavior: Behavior normal.        Thought Content: Thought content normal.        Judgment: Judgment normal.        Assessment and Plan       1. GERD without esophagitis Controlled, continue medication, refill provided   - pantoprazole (PROTONIX) 20 MG tablet; Take 1 tablet (20 mg total) by mouth daily.  Dispense: 30 tablet; Refill: 0  2. Essential hypertension Uncontrolled, change in treatment plan.  Addition of  Norvasc added to ACE.  Educated on DASH and exercise.  Will get labs for kidney function.  - benazepril (LOTENSIN) 20 MG tablet; Take 1 tablet (20 mg total) by mouth daily.  Dispense: 90 tablet; Refill: 2 - amLODipine (NORVASC) 5 MG tablet; Take 1 tablet (5 mg total) by mouth daily.  Dispense: 90 tablet; Refill: 3 - COMPLETE METABOLIC PANEL WITH GFR  3. Hyperlipidemia, unspecified hyperlipidemia type Previously stable, not on statin, will get labs and adjust treatment plan if needed. Educated well balanced diet with low fatty and fried foods.  - Lipid panel  4. Overweight (BMI 25.0-29.9) Educated well balanced diet with low fatty and fried foods. And exercise importance. 30-60 minutes on 5 days of the week.  Will be checking A1c   - Hemoglobin A1c  5. Vitamin D deficiency Will be checking to see if restart of supplementation is needed.   - VITAMIN D 25 Hydroxy (Vit-D Deficiency, Fractures)   Follow Up: 3 months   Perlie Mayo, DNP, AGNP-BC [place], [place],  [address] Office Hours: Mon-Thurs 8 am-5 pm; Fri 8 am-12 pm Office Phone:  [phone]  Office Fax: [phone]

## 2018-09-18 NOTE — Patient Instructions (Addendum)
    Thank you for coming into the office today. I appreciate the opportunity to provide you with the care for your health and wellness. Today we discussed: over health  FOLLOW UP: 3 months can be Mychart  Labs today  Pick up medications sent in today.  Please take blood pressure daily and mychart message me the results.   Get either benadryl (12.5 mg) or melatonin (3mg ) for sleep.   Increase water intake   Reduce caffeine after 3-5 pm (do this slowly)    Please continue to practice social distancing to keep you, your family, and our community safe.  If you must go out, please wear a Mask and practice good handwashing.  [place] YOUR HANDS WELL AND FREQUENTLY. AVOID TOUCHING YOUR FACE, UNLESS YOUR HANDS ARE FRESHLY WASHED.  GET FRESH AIR DAILY. STAY HYDRATED WITH WATER.   It was a pleasure to see you and I look forward to continuing to work together on your health and well-being. Please do not hesitate to call the office if you need care or have questions about your care.  Have a wonderful day and week. With Gratitude, Cherly Beach, DNP, AGNP-BC

## 2018-09-19 ENCOUNTER — Encounter: Payer: Self-pay | Admitting: Family Medicine

## 2018-09-20 DIAGNOSIS — E785 Hyperlipidemia, unspecified: Secondary | ICD-10-CM | POA: Diagnosis not present

## 2018-09-20 DIAGNOSIS — E559 Vitamin D deficiency, unspecified: Secondary | ICD-10-CM | POA: Diagnosis not present

## 2018-09-21 LAB — COMPLETE METABOLIC PANEL WITH GFR
AG Ratio: 1.4 (calc) (ref 1.0–2.5)
ALT: 22 U/L (ref 9–46)
AST: 21 U/L (ref 10–35)
Albumin: 4.4 g/dL (ref 3.6–5.1)
Alkaline phosphatase (APISO): 51 U/L (ref 35–144)
BUN: 14 mg/dL (ref 7–25)
CO2: 25 mmol/L (ref 20–32)
Calcium: 9.1 mg/dL (ref 8.6–10.3)
Chloride: 106 mmol/L (ref 98–110)
Creat: 1.13 mg/dL (ref 0.70–1.33)
GFR, Est African American: 86 mL/min/{1.73_m2} (ref >=60)
GFR, Est Non African American: 74 mL/min/{1.73_m2} (ref >=60)
Globulin: 3.1 g/dL (calc) (ref 1.9–3.7)
Glucose, Bld: 90 mg/dL (ref 65–99)
Potassium: 4.1 mmol/L (ref 3.5–5.3)
Sodium: 140 mmol/L (ref 135–146)
Total Bilirubin: 0.5 mg/dL (ref 0.2–1.2)
Total Protein: 7.5 g/dL (ref 6.1–8.1)

## 2018-09-21 LAB — LIPID PANEL
Cholesterol: 204 mg/dL — ABNORMAL HIGH (ref <200)
HDL: 58 mg/dL (ref >=40)
LDL Cholesterol (Calc): 127 mg/dL (calc) — ABNORMAL HIGH
Non-HDL Cholesterol (Calc): 146 mg/dL (calc) — ABNORMAL HIGH (ref <130)
Total CHOL/HDL Ratio: 3.5 (calc) (ref <5.0)
Triglycerides: 86 mg/dL (ref <150)

## 2018-09-21 LAB — VITAMIN D 25 HYDROXY (VIT D DEFICIENCY, FRACTURES): Vit D, 25-Hydroxy: 25 ng/mL — ABNORMAL LOW (ref 30–100)

## 2018-09-21 LAB — HEMOGLOBIN A1C
Hgb A1c MFr Bld: 5.1 % of total Hgb (ref <5.7)
Mean Plasma Glucose: 100 (calc)
eAG (mmol/L): 5.5 (calc)

## 2018-09-23 NOTE — Progress Notes (Signed)
Cholesterol is elevated, would not be a bad idea to repeat this in 3 months after some lifestyle changes (avoid fried, fatty, oily foods and exercise). If they remain elevated will need to consider low dose statin/medication to help.   A1c is good, no in prediabetic range.  Vitamin D is stable, but low, supplementation would not be a bad idea, if willing. Can get Vitamin D3 OTC and take 1,000 IU's daily.   Kidney and Liver look good, though kidney is on lower end of normal. Need to get BP back in control and Work to stay hydrated with water.

## 2018-09-25 ENCOUNTER — Other Ambulatory Visit: Payer: Self-pay

## 2018-09-25 ENCOUNTER — Emergency Department (HOSPITAL_COMMUNITY)
Admission: EM | Admit: 2018-09-25 | Discharge: 2018-09-25 | Disposition: A | Discharge status: Home / Self Care | Payer: BC Managed Care – PPO | Attending: Emergency Medicine | Admitting: Emergency Medicine

## 2018-09-25 ENCOUNTER — Emergency Department (HOSPITAL_COMMUNITY): Payer: BC Managed Care – PPO

## 2018-09-25 ENCOUNTER — Encounter (HOSPITAL_COMMUNITY): Payer: Self-pay | Admitting: Emergency Medicine

## 2018-09-25 DIAGNOSIS — Z79899 Other long term (current) drug therapy: Secondary | ICD-10-CM | POA: Insufficient documentation

## 2018-09-25 DIAGNOSIS — I10 Essential (primary) hypertension: Secondary | ICD-10-CM | POA: Diagnosis not present

## 2018-09-25 DIAGNOSIS — E785 Hyperlipidemia, unspecified: Secondary | ICD-10-CM | POA: Diagnosis not present

## 2018-09-25 DIAGNOSIS — R42 Dizziness and giddiness: Secondary | ICD-10-CM | POA: Insufficient documentation

## 2018-09-25 DIAGNOSIS — R531 Weakness: Secondary | ICD-10-CM | POA: Diagnosis not present

## 2018-09-25 DIAGNOSIS — F17290 Nicotine dependence, other tobacco product, uncomplicated: Secondary | ICD-10-CM | POA: Diagnosis not present

## 2018-09-25 LAB — CBC
HCT: 43.5 % (ref 39.0–52.0)
Hemoglobin: 14.8 g/dL (ref 13.0–17.0)
MCH: 31.6 pg (ref 26.0–34.0)
MCHC: 34 g/dL (ref 30.0–36.0)
MCV: 92.8 fL (ref 80.0–100.0)
Platelets: 189 10*3/uL (ref 150–400)
RBC: 4.69 MIL/uL (ref 4.22–5.81)
RDW: 13.1 % (ref 11.5–15.5)
WBC: 4.7 10*3/uL (ref 4.0–10.5)
nRBC: 0 % (ref 0.0–0.2)

## 2018-09-25 LAB — BASIC METABOLIC PANEL
Anion gap: 12 (ref 5–15)
BUN: 13 mg/dL (ref 6–20)
CO2: 23 mmol/L (ref 22–32)
Calcium: 9 mg/dL (ref 8.9–10.3)
Chloride: 104 mmol/L (ref 98–111)
Creatinine, Ser: 1.14 mg/dL (ref 0.61–1.24)
GFR calc Af Amer: >60 mL/min (ref >60)
GFR calc non Af Amer: >60 mL/min (ref >60)
Glucose, Bld: 100 mg/dL — ABNORMAL HIGH (ref 70–99)
Potassium: 3.6 mmol/L (ref 3.5–5.1)
Sodium: 139 mmol/L (ref 135–145)

## 2018-09-25 LAB — TROPONIN I (HIGH SENSITIVITY)
Troponin I (High Sensitivity): 3 ng/L (ref <18)
Troponin I (High Sensitivity): 4 ng/L (ref <18)

## 2018-09-25 NOTE — ED Triage Notes (Signed)
Pt sent by job after getting dizzy and too hot.  Was told bp was high and had some ekg abnormalities.  Pt denies any pain at this time

## 2018-09-25 NOTE — ED Provider Notes (Signed)
[facility] EMERGENCY DEPARTMENT Provider Note   CSN: [id number] Arrival date & time: 09/25/18  1530    History   Chief Complaint Chief Complaint  Patient presents with  . Dizziness    HPI Corey Thomas is a 53 y.o. male.  HPI: A 53 year old patient with a history of hypertension presents for evaluation of chest pain. Initial onset of pain was approximately 3-6 hours ago. The patient's chest pain is not worse with exertion. The patient's chest pain is middle- or left-sided, is not well-localized, is not described as heaviness/pressure/tightness, is not sharp and does not radiate to the arms/jaw/neck. The patient does not complain of nausea and denies diaphoresis. The patient has no history of stroke, has no history of peripheral artery disease, has not smoked in the past 90 days, denies any history of treated diabetes, has no relevant family history of coronary artery disease (first degree relative at less than age 23), has no history of hypercholesterolemia and does not have an elevated BMI (>=30).   The history is provided by the patient.  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  reports became very heated, lightheaded, left sided chest pain lasting about 10 minutes as he stood up to go back work from break. Relieved by:  Lying down Worsened by:  Standing up Ineffective treatments:  None tried Associated symptoms: chest pain and weakness   Associated symptoms: no headaches, no nausea, no palpitations, no shortness of breath, no syncope, no tinnitus, no vision changes and no vomiting   Risk factors: no anemia, no heart disease, no hx of stroke, no hx of vertigo and no new medications    Pt reports ems was called and an ekg  showed "changes' and elevated bp. He was sx free at the time ems arrived.    Past Medical History:  Diagnosis Date  . Arthritis   . GERD without esophagitis 06/08/2016  . HLD (hyperlipidemia) 07/15/2016  . Hypertension   . Vitamin D deficiency     Patient Active Problem List   Diagnosis Date Noted  . Primary osteoarthritis of right knee 02/21/2017  . Primary insomnia 02/21/2017  . Diverticulosis 12/15/2016  . Tubular adenoma of colon 11/14/2016  . HLD (hyperlipidemia) 07/15/2016  . Encounter for screening colonoscopy 07/04/2016  . Noncompliance with medications 06/08/2016  . Tobacco dipper 06/08/2016  . GERD without esophagitis 06/08/2016  . Monocular visual disturbance 06/08/2016  . Arthritis of left hip 05/13/2014  . Essential hypertension 10/14/2013    Past Surgical History:  Procedure Laterality Date  . COLONOSCOPY N/A 11/11/2016   Procedure: COLONOSCOPY;  Surgeon: Rogene Houston, MD;  Location: [facility];  Service: Endoscopy;  Laterality: N/A;  12:55-rescheduled to 8/24 @ 10:25am per Lelon Frohlich  . NO PAST SURGERIES          Home Medications    Prior to Admission medications   Medication Sig Start Date End Date Taking? Authorizing Provider  amLODipine (NORVASC) 5 MG tablet Take 1 tablet (5 mg total) by mouth daily. 09/18/18   Perlie Mayo, NP  benazepril (LOTENSIN) 20 MG tablet Take 1 tablet (20 mg total) by mouth daily. 09/18/18   Perlie Mayo, NP  pantoprazole (PROTONIX) 20 MG tablet Take 1 tablet (20 mg  total) by mouth daily. 09/18/18   Perlie Mayo, NP    Family History Family History  Problem Relation Age of Onset  . Healthy Sister   . Healthy Sister   . Alcohol abuse Brother 41       cirrhosis  . Diabetes Other   . Osteoarthritis Other   . Arthritis Maternal Grandmother     Social History Social History   Tobacco Use  . Smoking status: Never Smoker  . Smokeless tobacco: Current User    Types: Snuff  . Tobacco comment: 1/2 can daily    Substance Use Topics  . Alcohol use: Yes    Frequency: Never    Comment: occasional  . Drug use: No     Allergies   Tramadol   Review of Systems Review of Systems  Constitutional: Negative.   HENT: Negative for tinnitus.   Respiratory: Negative for shortness of breath.   Cardiovascular: Positive for chest pain. Negative for palpitations and syncope.  Gastrointestinal: Negative for nausea and vomiting.  Neurological: Positive for dizziness and weakness. Negative for headaches.  All other systems reviewed and are negative.    Physical Exam Updated Vital Signs BP (!) 153/103 (BP Location: Right Arm)   Pulse 73   Temp 97.6 F (36.4 C) (Temporal)   Resp 18   Ht 6' (1.829 m)   Wt 86.2 kg   SpO2 99%   BMI 25.77 kg/m   Physical Exam Vitals signs and nursing note reviewed.  Constitutional:      Appearance: He is well-developed.  HENT:     Head: Normocephalic and atraumatic.  Eyes:     Conjunctiva/sclera: Conjunctivae normal.  Neck:     Musculoskeletal: Normal range of motion.  Cardiovascular:     Rate and Rhythm: Normal rate and regular rhythm.     Heart sounds: Normal heart sounds.  Pulmonary:     Effort: Pulmonary effort is normal.     Breath sounds: Normal breath sounds. No wheezing.  Abdominal:     General: Bowel sounds are normal.     Palpations: Abdomen is soft.     Tenderness: There is no abdominal tenderness.  Musculoskeletal: Normal range of motion.     Right lower leg: No edema.     Left lower leg: No edema.  Skin:    General: Skin is warm and dry.     Capillary Refill: Capillary refill takes less than 2 seconds.  Neurological:     General: No focal deficit present.     Mental Status: He is alert and oriented to person, place, and time.      ED Treatments / Results  Labs (all labs ordered are listed, but only abnormal results are displayed) Labs Reviewed  BASIC METABOLIC PANEL - Abnormal; Notable for the following components:      Result  Value   Glucose, Bld 100 (*)    All other components within normal limits  CBC  TROPONIN I (HIGH SENSITIVITY)  TROPONIN I (HIGH SENSITIVITY)    EKG EKG Interpretation  Date/Time:  Tuesday September 25 2018 15:50:09 EDT Ventricular Rate:  74 PR Interval:  134 QRS Duration: 102 QT Interval:  378 QTC Calculation: 419 R Axis:   89 Text Interpretation:  Normal sinus rhythm Minimal voltage criteria for LVH, may be normal variant Borderline ECG Confirmed by Gerlene Fee [phone]) on 09/25/2018 7:28:10 PM   Radiology Dg Chest 2 View  Result Date: 09/25/2018 CLINICAL DATA:  Patient with dizziness. EXAM: CHEST - 2  VIEW COMPARISON:  Chest radiograph 03/26/2014 FINDINGS: Stable cardiac and mediastinal contours. No consolidative pulmonary opacities. No pleural effusion or pneumothorax. IMPRESSION: No active cardiopulmonary disease. Electronically Signed   By: Lovey Newcomer M.D.   On: 09/25/2018 20:01    Procedures Procedures (including critical care time)  Medications Ordered in ED Medications - No data to display   Initial Impression / Assessment and Plan / ED Course  I have reviewed the triage vital signs and the nursing notes.  Pertinent labs & imaging results that were available during my care of the patient were reviewed by me and considered in my medical decision making (see chart for details).     HEAR Score: 3  Pt with low heart score, sx free with negative troponin, ekg unchanged from prior with minimal criteria for LVH.  He remains asymptomatic during ed visit.  Reports had just had his bp meds adjusted last week adding norvasc to regimen as benazepril was not effective.  He was advised to continue monitoring bp (has bp device) and plan f/u with his pcp within 1 week. Encouraged increased fluid intake while at work.  Final Clinical Impressions(s) / ED Diagnoses   Final diagnoses:  Dizziness  Essential hypertension    ED Discharge Orders    None       Landis Martins  09/26/18 1310    Maudie Flakes, MD 10/01/18 [phone]

## 2018-09-25 NOTE — Discharge Instructions (Addendum)
Make sure you are drinking increased fluids to help avoid dehydration, especially while working in this hot environment. Your lab tests, ekg, chest xray and exam are reassuring today, but your blood pressure remains elevated.  Continue taking your new blood pressure medicine - plan a blood pressure recheck visit with your doctor within the next week.

## 2018-10-01 ENCOUNTER — Telehealth: Payer: Self-pay | Admitting: Family Medicine

## 2018-10-01 NOTE — Telephone Encounter (Signed)
FMLA notes, copied, sleeved

## 2018-10-09 ENCOUNTER — Telehealth: Payer: Self-pay

## 2018-10-09 NOTE — Telephone Encounter (Signed)
Patient dropped off FMLA paperwork. I contacted patient to see why he needed this paperwork completed as no reason was listed. He stated that sometimes his blood pressure gets elevated and they aren't allowed to work with high blood pressure. So with wearing the mask and everything sometimes his blood pressure gets high. They will have him sit 30 mins before sending him home or if he checks it at home and it's high he can't go in. This causes him to get points. I told him I would let the provider know but be prepared to have to come in and see [place] for a bp evaluation and to have meds adjusted as needed if his paperwork was due to his blood pressure being elevated.

## 2018-10-10 NOTE — Telephone Encounter (Signed)
Called pt and scheduled in office appt 10-11-18 at 11:00 with Jarrett Soho

## 2018-10-10 NOTE — Telephone Encounter (Signed)
Need appt per Jarrett Soho

## 2018-10-11 ENCOUNTER — Other Ambulatory Visit: Payer: Self-pay

## 2018-10-11 ENCOUNTER — Ambulatory Visit: Payer: BC Managed Care – PPO | Admitting: Family Medicine

## 2018-10-11 ENCOUNTER — Ambulatory Visit (INDEPENDENT_AMBULATORY_CARE_PROVIDER_SITE_OTHER): Payer: BC Managed Care – PPO | Admitting: Family Medicine

## 2018-10-11 ENCOUNTER — Encounter: Payer: Self-pay | Admitting: Family Medicine

## 2018-10-11 VITALS — BP 172/98 | HR 80 | Temp 99.1°F | Resp 12 | Ht 72.0 in | Wt 194.0 lb

## 2018-10-11 DIAGNOSIS — E663 Overweight: Secondary | ICD-10-CM

## 2018-10-11 DIAGNOSIS — I10 Essential (primary) hypertension: Secondary | ICD-10-CM

## 2018-10-11 MED ORDER — AMLODIPINE BESYLATE 10 MG PO TABS: 10.0000 mg | ORAL_TABLET | Freq: Every day | ORAL | 1 refills | Status: DC

## 2018-10-11 NOTE — Progress Notes (Signed)
Subjective:     Patient ID: Corey Thomas, male   DOB: 1965/12/04, 53 y.o.   MRN: [id number]  Corey Thomas presents for Hypertension (BP eval and FMLA paperwork)  Here for follow-up of hypertension.  male is not exercising ,   male is not adherent to a low-salt diet.  Blood pressure is not well controlled at home.  SBP usually ranges 140-170.   DBP usually ranges 80-90.  Cardiac symptoms: none. Patient denies: chest pain, chest pressure/discomfort, claudication, dyspnea, exertional chest pressure/discomfort, fatigue, irregular heart beat, lower extremity edema, near-syncope, orthopnea, palpitations, paroxysmal nocturnal dyspnea, syncope and tachypnea. Cardiovascular risk factors: dyslipidemia, hypertension, male gender, sedentary lifestyle and smoking/ tobacco exposure. Use of agents associated with hypertension: none. History of target organ damage: left ventricular hypertrophy.  Reports taking medications as directed. Denies side effects of current medications.    Needs FMLA paperwork, as his job does not allow his BP to be elevated.  Today patient denies signs and symptoms of COVID 19 infection including fever, chills, cough, shortness of breath, and headache.  Past Medical, Surgical, Social History, Allergies, and Medications have been Reviewed.   Past Medical History:  Diagnosis Date  . Arthritis   . GERD without esophagitis 06/08/2016  . HLD (hyperlipidemia) 07/15/2016  . Hypertension   . Vitamin D deficiency    Past Surgical History:  Procedure Laterality Date  . COLONOSCOPY N/A 11/11/2016   Procedure: COLONOSCOPY;  Surgeon: Rogene Houston, MD;  Location: [facility];  Service: Endoscopy;  Laterality: N/A;  12:55-rescheduled to 8/24 @ 10:25am per Lelon Frohlich  . NO PAST SURGERIES     Social History   Socioeconomic History  . Marital status: Married    Spouse name: Not on file  . Number of children: 2  . Years of education: 35  . Highest education level: Not on file   Occupational History  . Occupation: CREW MEMBER    Employer: [employer]    Comment: landscaping  Social Needs  . Financial resource strain: Not on file  . Food insecurity    Worry: Not on file    Inability: Not on file  . Transportation needs    Medical: Not on file    Non-medical: Not on file  Tobacco Use  . Smoking status: Never Smoker  . Smokeless tobacco: Current User    Types: Snuff  . Tobacco comment: 1/2 can daily   Substance and Sexual Activity  . Alcohol use: Yes    Frequency: Never    Comment: occasional  . Drug use: No  . Sexual activity: Yes    Birth control/protection: None  Lifestyle  . Physical activity    Days per week: Not on file    Minutes per session: Not on file  . Stress: Not on file  Relationships  . Social Herbalist on phone: Not on file    Gets together: Not on file    Attends religious service: Not on file    Active member of club or organization: Not on file    Attends meetings of clubs or organizations: Not on file    Relationship status: Not on file  . Intimate partner violence    Fear of current or ex partner: Not on file    Emotionally abused: Not on file    Physically abused: Not on file    Forced sexual activity: Not on file  Other Topics Concern  . Not  on file  Social History Narrative   Lives with wife [name]) July 30th 8 years    Son-Marties 17 step son   All other child grown   Grandchild-1 baby girl       Enjoys: sleep      Diet: fried foods (too much salt)   Caffeine: 12 pk daily    Water: Does not like water      Wears seat belt   Smoke and carbon monoxide detectors      Does not use phone while driving     Outpatient Encounter Medications as of 10/11/2018  Medication Sig  . amLODipine (NORVASC) 10 MG tablet Take 1 tablet (10 mg total) by mouth daily.  . benazepril (LOTENSIN) 20 MG tablet Take 1 tablet (20 mg total) by mouth daily.  . pantoprazole (PROTONIX) 20 MG tablet Take  1 tablet (20 mg total) by mouth daily.  . [DISCONTINUED] amLODipine (NORVASC) 5 MG tablet Take 1 tablet (5 mg total) by mouth daily.   No facility-administered encounter medications on file as of 10/11/2018.    Allergies  Allergen Reactions  . Tramadol Other (See Comments)    Patient states "cramps"    Review of Systems  Constitutional: Negative.  Negative for chills and fever.  HENT: Negative.   Eyes: Negative.  Negative for visual disturbance.  Respiratory: Negative.  Negative for cough and shortness of breath.   Cardiovascular: Negative.  Negative for chest pain, palpitations and leg swelling.  Gastrointestinal: Negative.   Endocrine: Negative.   Genitourinary: Negative.   Musculoskeletal: Negative.   Skin: Negative.   Allergic/Immunologic: Negative.   Neurological: Negative.  Negative for dizziness and headaches.  Hematological: Negative.   Psychiatric/Behavioral: Negative.   All other systems reviewed and are negative.      Objective:     BP (!) 172/98   Pulse 80   Temp 99.1 F (37.3 C) (Temporal)   Resp 12   Ht 6' (1.829 m)   Wt 194 lb (88 kg)   SpO2 99%   BMI 26.31 kg/m   Physical Exam Vitals signs and nursing note reviewed.  Constitutional:      Appearance: Normal appearance. He is well-developed, well-groomed and overweight.  HENT:     Head: Normocephalic and atraumatic.     Right Ear: External ear normal.     Left Ear: External ear normal.     Nose: Nose normal.  Eyes:     Extraocular Movements: Extraocular movements intact.     Conjunctiva/sclera: Conjunctivae normal.     Pupils: Pupils are equal, round, and reactive to light.  Neck:     Musculoskeletal: Normal range of motion and neck supple.  Cardiovascular:     Rate and Rhythm: Normal rate and regular rhythm.     Pulses: Normal pulses.     Heart sounds: Normal heart sounds.  Pulmonary:     Effort: Pulmonary effort is normal.     Breath sounds: Normal breath sounds.  Musculoskeletal:  Normal range of motion.  Skin:    General: Skin is warm.     Capillary Refill: Capillary refill takes less than 2 seconds.  Neurological:     Mental Status: He is alert and oriented to person, place, and time.  Psychiatric:        Mood and Affect: Mood normal.        Behavior: Behavior normal. Behavior is cooperative.        Thought Content: Thought content normal.  Judgment: Judgment normal.        Assessment and Plan        1. Essential hypertension  Uncontrolled, change in treatment plan.  Increased Norvasc to 10 mg continue  ACE.  Educated on DASH and exercise.   - amLODipine (NORVASC) 10 MG tablet; Take 1 tablet (10 mg total) by mouth daily.  Dispense: 90 tablet; Refill: 1  2. Overweight (BMI 25.0-29.9) Educated well balanced diet with low fatty and fried foods. And exercise importance. 30-60 minutes on 5 days of the week.   Follow Up: 3 weeks 11/02/2018   Perlie Mayo, DNP, AGNP-BC [place], [address] [place], [place] [address] Office Hours: Mon-Thurs 8 am-5 pm; Fri 8 am-12 pm Office Phone:  [phone]  Office Fax: [phone]

## 2018-10-11 NOTE — Patient Instructions (Signed)
    Thank you for coming into the office today. I appreciate the opportunity to provide you with the care for your health and wellness. Today we discussed: blood pressure  Follow Up: 3 weeks for bp  No labs today  Increase your Norvasc to 10 mg daily (can take two of the 5 mg tablets, until finished with current prescription). Continue taking other medication as directed.  Avoid fried and salty foods :)   FMLA paper work will be ready Monday.   Please continue to practice social distancing to keep you, your family, and our community safe.  If you must go out, please wear a Mask and practice good handwashing.  [place] YOUR HANDS WELL AND FREQUENTLY. AVOID TOUCHING YOUR FACE, UNLESS YOUR HANDS ARE FRESHLY WASHED.  GET FRESH AIR DAILY. STAY HYDRATED WITH WATER.   It was a pleasure to see you and I look forward to continuing to work together on your health and well-being. Please do not hesitate to call the office if you need care or have questions about your care.  Have a wonderful day and week.  With Gratitude,  Cherly Beach, DNP, AGNP-BC

## 2018-10-15 DIAGNOSIS — I10 Essential (primary) hypertension: Secondary | ICD-10-CM

## 2018-10-19 ENCOUNTER — Other Ambulatory Visit: Payer: BC Managed Care – PPO

## 2018-10-19 ENCOUNTER — Other Ambulatory Visit: Payer: Self-pay

## 2018-10-19 DIAGNOSIS — Z20822 Contact with and (suspected) exposure to covid-19: Secondary | ICD-10-CM

## 2018-10-19 DIAGNOSIS — R6889 Other general symptoms and signs: Secondary | ICD-10-CM | POA: Diagnosis not present

## 2018-10-21 LAB — NOVEL CORONAVIRUS, NAA: SARS-CoV-2, NAA: NOT DETECTED

## 2018-10-23 ENCOUNTER — Telehealth: Payer: Self-pay | Admitting: Family Medicine

## 2018-10-23 NOTE — Telephone Encounter (Signed)
Pt received negative covid results.

## 2018-10-24 DIAGNOSIS — R1031 Right lower quadrant pain: Secondary | ICD-10-CM | POA: Diagnosis not present

## 2018-10-24 DIAGNOSIS — N736 Female pelvic peritoneal adhesions (postinfective): Secondary | ICD-10-CM | POA: Diagnosis not present

## 2018-11-02 ENCOUNTER — Ambulatory Visit: Payer: BC Managed Care – PPO | Admitting: Family Medicine

## 2018-11-06 ENCOUNTER — Other Ambulatory Visit: Payer: Self-pay

## 2018-11-06 ENCOUNTER — Ambulatory Visit (INDEPENDENT_AMBULATORY_CARE_PROVIDER_SITE_OTHER): Payer: BC Managed Care – PPO | Admitting: Family Medicine

## 2018-11-06 ENCOUNTER — Encounter: Payer: Self-pay | Admitting: Family Medicine

## 2018-11-06 VITALS — BP 139/90 | HR 69 | Temp 98.4°F | Resp 12 | Ht 72.0 in | Wt 194.1 lb

## 2018-11-06 DIAGNOSIS — I10 Essential (primary) hypertension: Secondary | ICD-10-CM | POA: Diagnosis not present

## 2018-11-06 DIAGNOSIS — E663 Overweight: Secondary | ICD-10-CM | POA: Diagnosis not present

## 2018-11-06 NOTE — Progress Notes (Signed)
Subjective:     Patient ID: Corey Thomas, male   DOB: Oct 10, 1965, 53 y.o.   MRN: [id number]  Corey Thomas presents for Hypertension (3 week follow up)  Here for follow-up of hypertension.   is not exercising ,    is not adherent to a low-salt diet.  Cardiac symptoms: none. Patient denies: chest pain, chest pressure/discomfort, claudication, dyspnea, exertional chest pressure/discomfort, fatigue, irregular heart beat, lower extremity edema, near-syncope, orthopnea, palpitations, paroxysmal nocturnal dyspnea, syncope and tachypnea. Cardiovascular risk factors: dyslipidemia, hypertension, male gender and sedentary lifestyle. Use of agents associated with hypertension: none. History of target organ damage: none. Reports BP is elevated when he wears a mask, he thinks it is just mask related and some anxiety about being at office. Overall he reports feeling much better and overall happy with the changes he has made.  Denies side effects of current medications. Reports taking medications as directed without issue or side effects.  Today patient denies signs and symptoms of COVID 19 infection including fever, chills, cough, shortness of breath, and headache.  Past Medical, Surgical, Social History, Allergies, and Medications have been Reviewed.   Past Medical History:  Diagnosis Date  . Arthritis   . GERD without esophagitis 06/08/2016  . HLD (hyperlipidemia) 07/15/2016  . Hypertension   . Vitamin D deficiency    Past Surgical History:  Procedure Laterality Date  . COLONOSCOPY N/A 11/11/2016   Procedure: COLONOSCOPY;  Surgeon: Rogene Houston, MD;  Location: [facility];  Service: Endoscopy;  Laterality: N/A;  12:55-rescheduled to 8/24 @ 10:25am per Lelon Frohlich  . NO PAST SURGERIES     Social History   Socioeconomic History  . Marital status: Married    Spouse name: Not on file  . Number of children: 2  . Years of education: 13  . Highest education level: Not on file  Occupational  History  . Occupation: CREW MEMBER    Employer: [employer]    Comment: landscaping  Social Needs  . Financial resource strain: Not on file  . Food insecurity    Worry: Not on file    Inability: Not on file  . Transportation needs    Medical: Not on file    Non-medical: Not on file  Tobacco Use  . Smoking status: Never Smoker  . Smokeless tobacco: Current User    Types: Snuff  . Tobacco comment: 1/2 can daily   Substance and Sexual Activity  . Alcohol use: Yes    Frequency: Never    Comment: occasional  . Drug use: No  . Sexual activity: Yes    Birth control/protection: None  Lifestyle  . Physical activity    Days per week: Not on file    Minutes per session: Not on file  . Stress: Not on file  Relationships  . Social Herbalist on phone: Not on file    Gets together: Not on file    Attends religious service: Not on file    Active member of club or organization: Not on file    Attends meetings of clubs or organizations: Not on file    Relationship status: Not on file  . Intimate partner violence    Fear of current or ex partner: Not on file    Emotionally abused: Not on file    Physically abused: Not on file    Forced sexual activity: Not on file  Other Topics Concern  . Not  on file  Social History Narrative   Lives with wife Corey Thomas) July 30th 8 years    Son-Marties 17 step son   All other child grown   Grandchild-1 baby girl       Enjoys: sleep      Diet: fried foods (too much salt)   Caffeine: 12 pk daily    Water: Does not like water      Wears seat belt   Smoke and carbon monoxide detectors      Does not use phone while driving     Outpatient Encounter Medications as of 11/06/2018  Medication Sig  . amLODipine (NORVASC) 10 MG tablet Take 1 tablet (10 mg total) by mouth daily.  . benazepril (LOTENSIN) 20 MG tablet Take 1 tablet (20 mg total) by mouth daily.  . pantoprazole (PROTONIX) 20 MG tablet Take 1 tablet (20  mg total) by mouth daily.   No facility-administered encounter medications on file as of 11/06/2018.    Allergies  Allergen Reactions  . Tramadol Other (See Comments)    Patient states "cramps"    Review of Systems  Constitutional: Negative for chills and fever.  HENT: Negative.   Eyes: Negative.  Negative for visual disturbance.  Respiratory: Negative.  Negative for cough and shortness of breath.   Cardiovascular: Negative.   Gastrointestinal: Negative.   Endocrine: Negative.   Genitourinary: Negative.   Musculoskeletal: Negative.   Skin: Negative.   Allergic/Immunologic: Negative.   Neurological: Negative.  Negative for dizziness and headaches.  Hematological: Negative.   Psychiatric/Behavioral: Negative.   All other systems reviewed and are negative.      Objective:     BP 139/90   Pulse 69   Temp 98.4 F (36.9 C) (Oral)   Resp 12   Ht 6' (1.829 m)   Wt 194 lb 1.9 oz (88.1 kg)   SpO2 98%   BMI 26.33 kg/m   Physical Exam Vitals signs and nursing note reviewed.  Constitutional:      Appearance: Normal appearance. He is well-developed, well-groomed and overweight.  HENT:     Head: Normocephalic and atraumatic.     Right Ear: External ear normal.     Left Ear: External ear normal.     Nose: Nose normal.     Mouth/Throat:     Mouth: Mucous membranes are moist.     Pharynx: Oropharynx is clear.  Eyes:     General:        Right eye: No discharge.        Left eye: No discharge.     Conjunctiva/sclera: Conjunctivae normal.  Neck:     Musculoskeletal: Normal range of motion and neck supple.  Cardiovascular:     Rate and Rhythm: Normal rate and regular rhythm.     Pulses: Normal pulses.     Heart sounds: Normal heart sounds.  Pulmonary:     Effort: Pulmonary effort is normal.     Breath sounds: Normal breath sounds.  Musculoskeletal: Normal range of motion.  Skin:    General: Skin is warm.  Neurological:     General: No focal deficit present.      Mental Status: He is alert and oriented to person, place, and time.  Psychiatric:        Attention and Perception: Attention normal.        Mood and Affect: Mood normal.        Speech: Speech normal.        Behavior: Behavior normal. Behavior  is cooperative.        Thought Content: Thought content normal.        Cognition and Memory: Cognition normal.        Judgment: Judgment normal.        Assessment and Plan        1. Essential hypertension YASUO PHIMMASONE is encouraged to maintain a well balanced diet that is low in salt. Controlled, continue current medication regimen.  No refills needed.  He is also reminded that exercise is beneficial for heart health and control of  Blood pressure. 30-60 minutes daily is recommended-walking was suggested.  2. Overweight (BMI 25.0-29.9) Stable, small 4 pound gain over last 2 months.Reports not eating a good diet.  Corey Thomas is re-educated about the importance of exercise daily to help with weight management. A minumum of 30 minutes daily is recommended. Additionally, importance of healthy food choices  with portion control discussed. Encouraged to start a food diary, count calories and to consider joining a  support group if possible. Diet information sheets offered.  Filed Weights   11/06/18 0807  Weight: 194 lb 1.9 oz (88.1 kg)    Follow Up: Dec   Perlie Mayo, DNP, AGNP-BC [address], [place], [place] [address] Office Hours: Mon-Thurs 8 am-5 pm; Fri 8 am-12 pm Office Phone:  [phone]  Office Fax: [phone]

## 2018-11-06 NOTE — Patient Instructions (Addendum)
    Thank you for coming into the office today. I appreciate the opportunity to provide you with the care for your health and wellness. Today we discussed: blood pressure  Follow Up: Dec   No labs today  GREAT JOB ON SODA REDUCTION!!! :)  Continue to work on a exercise plan and eating a good well balanced diet.  Please continue to practice social distancing to keep you, your family, and our community safe.  If you must go out, please wear a Mask and practice good handwashing.  [place] YOUR HANDS WELL AND FREQUENTLY. AVOID TOUCHING YOUR FACE, UNLESS YOUR HANDS ARE FRESHLY WASHED.  GET FRESH AIR DAILY. STAY HYDRATED WITH WATER.   It was a pleasure to see you and I look forward to continuing to work together on your health and well-being. Please do not hesitate to call the office if you need care or have questions about your care.  Have a wonderful day and week. With Gratitude, Cherly Beach, DNP, AGNP-BC

## 2018-12-19 ENCOUNTER — Other Ambulatory Visit: Payer: Self-pay

## 2018-12-19 ENCOUNTER — Ambulatory Visit (INDEPENDENT_AMBULATORY_CARE_PROVIDER_SITE_OTHER): Payer: BC Managed Care – PPO

## 2018-12-19 ENCOUNTER — Telehealth: Payer: BC Managed Care – PPO | Admitting: Family Medicine

## 2018-12-19 DIAGNOSIS — Z23 Encounter for immunization: Secondary | ICD-10-CM | POA: Diagnosis not present

## 2019-03-12 ENCOUNTER — Encounter: Payer: Self-pay | Admitting: Family Medicine

## 2019-03-12 ENCOUNTER — Other Ambulatory Visit: Payer: Self-pay

## 2019-03-12 ENCOUNTER — Ambulatory Visit: Payer: BC Managed Care – PPO | Admitting: Family Medicine

## 2019-03-12 ENCOUNTER — Ambulatory Visit (INDEPENDENT_AMBULATORY_CARE_PROVIDER_SITE_OTHER): Payer: Self-pay | Admitting: Family Medicine

## 2019-03-12 VITALS — BP 152/98 | HR 83 | Temp 98.2°F | Resp 15 | Ht 72.0 in | Wt 204.1 lb

## 2019-03-12 DIAGNOSIS — I10 Essential (primary) hypertension: Secondary | ICD-10-CM

## 2019-03-12 DIAGNOSIS — N529 Male erectile dysfunction, unspecified: Secondary | ICD-10-CM

## 2019-03-12 MED ORDER — TADALAFIL 10 MG PO TABS: 10.0000 mg | ORAL_TABLET | Freq: Every day | ORAL | 1 refills | Status: DC | PRN

## 2019-03-12 MED ORDER — BENAZEPRIL HCL 40 MG PO TABS: 40.0000 mg | ORAL_TABLET | Freq: Every day | ORAL | 1 refills | Status: DC

## 2019-03-12 NOTE — Assessment & Plan Note (Signed)
Poor erection response-will try Cialis.  Reviewed side effects, risks and benefits of medication.   Patient acknowledged agreement and understanding of the plan.

## 2019-03-12 NOTE — Assessment & Plan Note (Addendum)
Corey Thomas is encouraged to maintain a well balanced diet that is low in salt. Not well controlled, continue current medication regimen. No refills needed.  Increased Benazepril to 40 mg close follow up 4 weeks  Reviewed side effects, risks and benefits of medication.   Patient acknowledged agreement and understanding of the plan.    Additionally, he is also reminded that exercise is beneficial for heart health and control of  Blood pressure. 30-60 minutes daily is recommended-walking was suggested.

## 2019-03-12 NOTE — Progress Notes (Signed)
Subjective:  Patient ID: Corey Thomas, male    DOB: [birth year]  Age: 53 y.o. MRN: [id number]  CC:  Chief Complaint  Patient presents with  . Hypertension    bp evaluation      HPI  Hypertension This is a chronic problem. The current episode started more than 1 year ago. The problem has been waxing and waning since onset. The problem is uncontrolled. Pertinent negatives include no anxiety, blurred vision, chest pain, headaches, malaise/fatigue, neck pain, orthopnea, palpitations, peripheral edema, PND, shortness of breath or sweats. There are no associated agents to hypertension. Risk factors for coronary artery disease include male gender, smoking/tobacco exposure, sedentary lifestyle, stress and dyslipidemia. Past treatments include calcium channel blockers and ACE inhibitors. The current treatment provides moderate improvement. Compliance problems include exercise and diet.   Erectile Dysfunction This is a new problem. The current episode started more than 1 month ago. The problem has been gradually worsening since onset. The nature of his difficulty is achieving erection, maintaining erection and penetration. Non-physiologic factors contributing to erectile dysfunction are anxiety and a decreased libido. He reports his erection duration to be less than 1 minute. Irritative symptoms do not include frequency, nocturia or urgency. Obstructive symptoms do not include dribbling, incomplete emptying, an intermittent stream, a slower stream, straining or a weak stream. Pertinent negatives include no chills, dysuria, genital pain, hematuria, hesitancy or inability to urinate. The symptoms are aggravated by stress, medications and alcohol use. Past treatments include nothing. Risk factors include hypertension and tobacco use.    Today patient denies signs and symptoms of COVID 19 infection including fever, chills, cough, shortness of breath, and headache. Past Medical, Surgical, Social  History, Allergies, and Medications have been Reviewed.   Past Medical History:  Diagnosis Date  . Arthritis   . GERD without esophagitis 06/08/2016  . HLD (hyperlipidemia) 07/15/2016  . Hypertension   . Vitamin D deficiency     Current Meds  Medication Sig  . amLODipine (NORVASC) 10 MG tablet Take 1 tablet (10 mg total) by mouth daily.  . benazepril (LOTENSIN) 40 MG tablet Take 1 tablet (40 mg total) by mouth daily.  . pantoprazole (PROTONIX) 20 MG tablet Take 1 tablet (20 mg total) by mouth daily.  . [DISCONTINUED] benazepril (LOTENSIN) 20 MG tablet Take 1 tablet (20 mg total) by mouth daily.    ROS:  Review of Systems  Constitutional: Negative for chills and malaise/fatigue.  Eyes: Negative for blurred vision.  Respiratory: Negative for shortness of breath.   Cardiovascular: Negative for chest pain, palpitations, orthopnea and PND.  Genitourinary: Positive for decreased libido. Negative for dysuria, frequency, hematuria, hesitancy, incomplete emptying, nocturia and urgency.  Musculoskeletal: Negative for neck pain.  Neurological: Negative for headaches.  Psychiatric/Behavioral: The patient is nervous/anxious.      Objective:   Today's Vitals: BP (!) 152/98   Pulse 83   Temp 98.2 F (36.8 C) (Temporal)   Resp 15   Ht 6' (1.829 m)   Wt 204 lb 1.9 oz (92.6 kg)   SpO2 98%   BMI 27.68 kg/m  Vitals with BMI 03/12/2019 11/06/2018 11/06/2018  Height 6\' 0"  - 6\' 0"   Weight 204 lbs 2 oz - 194 lbs 2 oz  BMI 0000000 - 123456  Systolic 0000000 [id number]  Diastolic 98 90 91  Pulse 83 - 69     Physical Exam Vitals and nursing note reviewed.  Constitutional:      Appearance: Normal appearance. He is well-developed  and well-groomed. He is obese.  HENT:     Head: Normocephalic and atraumatic.     Right Ear: External ear normal.     Left Ear: External ear normal.     Nose: Nose normal.     Mouth/Throat:     Mouth: Mucous membranes are moist.     Pharynx: Oropharynx is clear.   Eyes:     General:        Right eye: No discharge.        Left eye: No discharge.     Conjunctiva/sclera: Conjunctivae normal.  Cardiovascular:     Rate and Rhythm: Normal rate and regular rhythm.     Pulses: Normal pulses.     Heart sounds: Normal heart sounds.  Pulmonary:     Effort: Pulmonary effort is normal.     Breath sounds: Normal breath sounds.  Musculoskeletal:        General: Normal range of motion.     Cervical back: Normal range of motion and neck supple.  Skin:    General: Skin is warm.  Neurological:     General: No focal deficit present.     Mental Status: He is alert and oriented to person, place, and time.  Psychiatric:        Attention and Perception: Attention normal.        Mood and Affect: Mood normal.        Speech: Speech normal.        Behavior: Behavior normal. Behavior is cooperative.        Thought Content: Thought content normal.        Cognition and Memory: Cognition normal.        Judgment: Judgment normal.          Assessment   1. Erectile dysfunction, unspecified erectile dysfunction type   2. Essential hypertension     Tests ordered No orders of the defined types were placed in this encounter.   Plan: Please see assessment and plan per problem list above.   Meds ordered this encounter  Medications  . benazepril (LOTENSIN) 40 MG tablet    Sig: Take 1 tablet (40 mg total) by mouth daily.    Dispense:  30 tablet    Refill:  1    Order Specific Question:   Supervising Provider    Answer:   SIMPSON, MARGARET E [id number]  . tadalafil (CIALIS) 10 MG tablet    Sig: Take 1 tablet (10 mg total) by mouth daily as needed for erectile dysfunction.    Dispense:  30 tablet    Refill:  1    Order Specific Question:   Supervising Provider    Answer:   Fayrene Helper [id number]    Patient to follow-up in 1 month.  Perlie Mayo, NP

## 2019-03-12 NOTE — Patient Instructions (Addendum)
  I appreciate the opportunity to provide you with care for your health and wellness. Today we discussed: blood pressure   Follow up: 1 months in office for BP check   No labs or referrals today  Start increased dose of Benazepril 40 mg  I hope you have a wonderful, happy, safe, and healthy Holiday Season! See you in the New Year :)  Please continue to practice social distancing to keep you, your family, and our community safe.  If you must go out, please wear a mask and practice good handwashing.  It was a pleasure to see you and I look forward to continuing to work together on your health and well-being. Please do not hesitate to call the office if you need care or have questions about your care.  Have a wonderful day and week. With Gratitude, Cherly Beach, DNP, AGNP-BC

## 2019-04-12 ENCOUNTER — Ambulatory Visit: Payer: Self-pay | Admitting: Family Medicine

## 2019-04-16 ENCOUNTER — Ambulatory Visit (INDEPENDENT_AMBULATORY_CARE_PROVIDER_SITE_OTHER): Payer: Self-pay | Admitting: Family Medicine

## 2019-04-16 ENCOUNTER — Encounter: Payer: Self-pay | Admitting: Family Medicine

## 2019-04-16 ENCOUNTER — Other Ambulatory Visit: Payer: Self-pay

## 2019-04-16 VITALS — BP 150/90 | HR 64 | Temp 96.9°F | Resp 15 | Ht 72.0 in | Wt 207.1 lb

## 2019-04-16 DIAGNOSIS — I10 Essential (primary) hypertension: Secondary | ICD-10-CM

## 2019-04-16 DIAGNOSIS — E663 Overweight: Secondary | ICD-10-CM

## 2019-04-16 NOTE — Assessment & Plan Note (Signed)
Deteriorated,Latham L Vanderzanden is re-educated about the importance of exercise daily to help with weight management. A minumum of 30 minutes daily is recommended. Additionally, importance of healthy food choices  with portion control discussed.  Wt Readings from Last 3 Encounters:  04/16/19 207 lb 1.9 oz (93.9 kg)  03/12/19 204 lb 1.9 oz (92.6 kg)  11/06/18 194 lb 1.9 oz (88.1 kg)

## 2019-04-16 NOTE — Progress Notes (Signed)
Subjective:  Patient ID: Corey Thomas, male    DOB: 30-Sep-1965  Age: 54 y.o. MRN: [id number]  CC:  Chief Complaint  Patient presents with  . Hypertension    follow up  blood pressure evaluation      HPI  HPI This is a chronic problem. The current episode started more than 1 year ago. The problem has been waxing and waning since onset. The problem is uncontrolled. Pertinent negatives include no anxiety, blurred vision, chest pain, headaches, malaise/fatigue, neck pain, orthopnea, palpitations, peripheral edema, PND, shortness of breath or sweats. There are no associated agents to hypertension. Risk factors for coronary artery disease include male gender, smoking/tobacco exposure, sedentary lifestyle, stress and dyslipidemia. Past treatments include calcium channel blockers and ACE inhibitors. The current treatment provides moderate improvement. Compliance problems include exercise and diet.  Reports that he has been eating a lot more rice, bread, pizza and other items that have high sodium.  He also reports weight gain that he has noticed.  Since August 2020 he is put on 13 pounds.  He reports that he is not currently working stop working secondary to the environment that he was working in.  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  Reports that he lost his insurance secondary to that so he does not have any insurance at this time.   Reports taking all medications as directed and denies side effects.   Today patient denies signs and symptoms of COVID 19 infection including fever, chills, cough, shortness of breath, and headache. Past Medical, Surgical, Social History, Allergies, and Medications have been Reviewed.   Past Medical History:  Diagnosis Date  . Arthritis   . GERD without esophagitis 06/08/2016  . HLD (hyperlipidemia) 07/15/2016  . Hypertension   . Vitamin D deficiency     Current Meds  Medication Sig  . amLODipine (NORVASC) 10 MG tablet Take 1 tablet  (10 mg total) by mouth daily.  . benazepril (LOTENSIN) 40 MG tablet Take 1 tablet (40 mg total) by mouth daily.  . pantoprazole (PROTONIX) 20 MG tablet Take 1 tablet (20 mg total) by mouth daily.  . tadalafil (CIALIS) 10 MG tablet Take 1 tablet (10 mg total) by mouth daily as needed for erectile dysfunction.    ROS:  Review of Systems  Constitutional: Negative.   HENT: Negative.   Eyes: Negative.   Respiratory: Negative.   Cardiovascular: Negative.   Gastrointestinal: Negative.   Genitourinary: Negative.   Musculoskeletal: Negative.   Skin: Negative.   Neurological: Negative.   Endo/Heme/Allergies: Negative.   Psychiatric/Behavioral: Negative.   All other systems reviewed and are negative.    Objective:   Today's Vitals: BP (!) 150/90   Pulse 64   Temp (!) 96.9 F (36.1 C) (Temporal)   Resp 15   Ht 6' (1.829 m)   Wt 207 lb 1.9 oz (93.9 kg)   SpO2 98%   BMI 28.09 kg/m  Vitals with BMI 04/16/2019 04/16/2019 03/12/2019  Height - 6\' 0"  6\' 0"   Weight - 207 lbs 2 oz 204 lbs 2 oz  BMI - Q000111Q 0000000  Systolic Q000111Q 123456 0000000  Diastolic 90 94 98  Pulse - 64 83     Physical Exam Vitals and nursing note reviewed.  Constitutional:      Appearance: Normal appearance. He is well-developed, well-groomed and overweight.  HENT:     Head: Normocephalic and atraumatic.     Right Ear: External ear normal.  Left Ear: External ear normal.     Nose: Nose normal.     Mouth/Throat:     Mouth: Mucous membranes are moist.     Pharynx: Oropharynx is clear.  Eyes:     General:        Right eye: No discharge.        Left eye: No discharge.     Conjunctiva/sclera: Conjunctivae normal.  Cardiovascular:     Rate and Rhythm: Normal rate and regular rhythm.     Pulses: Normal pulses.     Heart sounds: Normal heart sounds.  Pulmonary:     Effort: Pulmonary effort is normal.     Breath sounds: Normal breath sounds.  Musculoskeletal:        General: Normal range of motion.     Cervical  back: Normal range of motion and neck supple.  Skin:    General: Skin is warm.  Neurological:     General: No focal deficit present.     Mental Status: He is alert and oriented to person, place, and time.  Psychiatric:        Attention and Perception: Attention normal.        Mood and Affect: Mood normal.        Speech: Speech normal.        Behavior: Behavior normal. Behavior is cooperative.        Thought Content: Thought content normal.        Cognition and Memory: Cognition normal.        Judgment: Judgment normal.     Assessment   1. Essential hypertension   2. Overweight (BMI 25.0-29.9)     Tests ordered No orders of the defined types were placed in this encounter.    Plan: Please see assessment and plan per problem list above.   No orders of the defined types were placed in this encounter.   Patient to follow-up in 8 weeks   Perlie Mayo, NP

## 2019-04-16 NOTE — Assessment & Plan Note (Signed)
Reports taking medication as directed. Has gained 13 pounds since Aug 2020 Reports a diet high in salt.  Corey Thomas is encouraged to maintain a well balanced diet that is low in salt. Controlled, continue current medication regimen.  No refills needed.  Additionally, he is also reminded that exercise is beneficial for heart health and control of  Blood pressure. 30-60 minutes daily is recommended-walking was suggested.  I have suggested adding a new medication, but he wants to try to get diet and lifestyle under control first.

## 2019-04-16 NOTE — Patient Instructions (Addendum)
Happy New Year! May you have a year filled with hope, love, happiness and laughter.  I appreciate the opportunity to provide you with care for your health and wellness. Today we discussed: Bp   Follow up: 8 weeks   No labs or referrals today  Insurance to look into [employer]  Good RX app for medications  [facility] in town for office/BP visits  GOALS: Start eating better, more veggies and fruits, less breads, rice, pizza Increase water intake  GET ACTIVE :)   Please continue to practice social distancing to keep you, your family, and our community safe.  If you must go out, please wear a mask and practice good handwashing.  It was a pleasure to see you and I look forward to continuing to work together on your health and well-being. Please do not hesitate to call the office if you need care or have questions about your care.  Have a wonderful day and week. With Gratitude, Cherly Beach, DNP, AGNP-BC   DASH Eating Plan DASH stands for "Dietary Approaches to Stop Hypertension." The DASH eating plan is a healthy eating plan that has been shown to reduce high blood pressure (hypertension). It may also reduce your risk for type 2 diabetes, heart disease, and stroke. The DASH eating plan may also help with weight loss. What are tips for following this plan?  General guidelines  Avoid eating more than 2,300 mg (milligrams) of salt (sodium) a day. If you have hypertension, you may need to reduce your sodium intake to 1,500 mg a day.  Limit alcohol intake to no more than 1 drink a day for nonpregnant women and 2 drinks a day for men. One drink equals 12 oz of beer, 5 oz of wine, or 1 oz of hard liquor.  Work with your health care provider to maintain a healthy body weight or to lose weight. Ask what an ideal weight is for you.  Get at least 30 minutes of exercise that causes your heart to beat faster (aerobic exercise) most days of the week. Activities may include walking, swimming,  or biking.  Work with your health care provider or diet and nutrition specialist (dietitian) to adjust your eating plan to your individual calorie needs. Reading food labels   Check food labels for the amount of sodium per serving. Choose foods with less than 5 percent of the Daily Value of sodium. Generally, foods with less than 300 mg of sodium per serving fit into this eating plan.  To find whole grains, look for the word "whole" as the first word in the ingredient list. Shopping  Buy products labeled as "low-sodium" or "no salt added."  Buy fresh foods. Avoid canned foods and premade or frozen meals. Cooking  Avoid adding salt when cooking. Use salt-free seasonings or herbs instead of table salt or sea salt. Check with your health care provider or pharmacist before using salt substitutes.  Do not fry foods. Cook foods using healthy methods such as baking, boiling, grilling, and broiling instead.  Cook with heart-healthy oils, such as olive, canola, soybean, or sunflower oil. Meal planning  Eat a balanced diet that includes: ? 5 or more servings of fruits and vegetables each day. At each meal, try to fill half of your plate with fruits and vegetables. ? Up to 6-8 servings of whole grains each day. ? Less than 6 oz of lean meat, poultry, or fish each day. A 3-oz serving of meat is about the same size as a  deck of cards. One egg equals 1 oz. ? 2 servings of low-fat dairy each day. ? A serving of nuts, seeds, or beans 5 times each week. ? Heart-healthy fats. Healthy fats called Omega-3 fatty acids are found in foods such as flaxseeds and coldwater fish, like sardines, salmon, and mackerel.  Limit how much you eat of the following: ? Canned or prepackaged foods. ? Food that is high in trans fat, such as fried foods. ? Food that is high in saturated fat, such as fatty meat. ? Sweets, desserts, sugary drinks, and other foods with added sugar. ? Full-fat dairy products.  Do not salt  foods before eating.  Try to eat at least 2 vegetarian meals each week.  Eat more home-cooked food and less restaurant, buffet, and fast food.  When eating at a restaurant, ask that your food be prepared with less salt or no salt, if possible. What foods are recommended? The items listed may not be a complete list. Talk with your dietitian about what dietary choices are best for you. Grains Whole-grain or whole-wheat bread. Whole-grain or whole-wheat pasta. Brown rice. [place]. Bulgur. Whole-grain and low-sodium cereals. Pita bread. Low-fat, low-sodium crackers. Whole-wheat flour tortillas. Vegetables Fresh or frozen vegetables (raw, steamed, roasted, or grilled). Low-sodium or reduced-sodium tomato and vegetable juice. Low-sodium or reduced-sodium tomato sauce and tomato paste. Low-sodium or reduced-sodium canned vegetables. Fruits All fresh, dried, or frozen fruit. Canned fruit in natural juice (without added sugar). Meat and other protein foods Skinless chicken or [place]. Ground chicken or [place]. Pork with fat trimmed off. Fish and seafood. Egg whites. Dried beans, peas, or lentils. Unsalted nuts, nut butters, and seeds. Unsalted canned beans. Lean cuts of beef with fat trimmed off. Low-sodium, lean deli meat. Dairy Low-fat (1%) or fat-free (skim) milk. Fat-free, low-fat, or reduced-fat cheeses. Nonfat, low-sodium ricotta or cottage cheese. Low-fat or nonfat yogurt. Low-fat, low-sodium cheese. Fats and oils Soft margarine without trans fats. Vegetable oil. Low-fat, reduced-fat, or light mayonnaise and salad dressings (reduced-sodium). Canola, safflower, olive, soybean, and sunflower oils. Avocado. Seasoning and other foods Herbs. Spices. Seasoning mixes without salt. Unsalted popcorn and pretzels. Fat-free sweets. What foods are not recommended? The items listed may not be a complete list. Talk with your dietitian about what dietary choices are best for you. Grains Baked goods  made with fat, such as croissants, muffins, or some breads. Dry pasta or rice meal packs. Vegetables Creamed or fried vegetables. Vegetables in a cheese sauce. Regular canned vegetables (not low-sodium or reduced-sodium). Regular canned tomato sauce and paste (not low-sodium or reduced-sodium). Regular tomato and vegetable juice (not low-sodium or reduced-sodium). Angie Fava. Olives. Fruits Canned fruit in a light or heavy syrup. Fried fruit. Fruit in cream or butter sauce. Meat and other protein foods Fatty cuts of meat. Ribs. Fried meat. Berniece Salines. Sausage. Bologna and other processed lunch meats. Salami. Fatback. Hotdogs. Bratwurst. Salted nuts and seeds. Canned beans with added salt. Canned or smoked fish. Whole eggs or egg yolks. Chicken or [place] with skin. Dairy Whole or 2% milk, cream, and half-and-half. Whole or full-fat cream cheese. Whole-fat or sweetened yogurt. Full-fat cheese. Nondairy creamers. Whipped toppings. Processed cheese and cheese spreads. Fats and oils Butter. Stick margarine. Lard. Shortening. Ghee. Bacon fat. Tropical oils, such as coconut, palm kernel, or palm oil. Seasoning and other foods Salted popcorn and pretzels. Onion salt, garlic salt, seasoned salt, table salt, and sea salt. Worcestershire sauce. Tartar sauce. Barbecue sauce. Teriyaki sauce. Soy sauce, including reduced-sodium. Steak sauce. Canned  and packaged gravies. Fish sauce. Oyster sauce. Cocktail sauce. Horseradish that you find on the shelf. Ketchup. Mustard. Meat flavorings and tenderizers. Bouillon cubes. Hot sauce and Tabasco sauce. Premade or packaged marinades. Premade or packaged taco seasonings. Relishes. Regular salad dressings. Where to find more information:  National Heart, Lung, and [place]: https://wilson-eaton.com/  American Heart Association: www.heart.org Summary  The DASH eating plan is a healthy eating plan that has been shown to reduce high blood pressure (hypertension). It may also reduce  your risk for type 2 diabetes, heart disease, and stroke.  With the DASH eating plan, you should limit salt (sodium) intake to 2,300 mg a day. If you have hypertension, you may need to reduce your sodium intake to 1,500 mg a day.  When on the DASH eating plan, aim to eat more fresh fruits and vegetables, whole grains, lean proteins, low-fat dairy, and heart-healthy fats.  Work with your health care provider or diet and nutrition specialist (dietitian) to adjust your eating plan to your individual calorie needs. This information is not intended to replace advice given to you by your health care provider. Make sure you discuss any questions you have with your health care provider. Document Revised: 02/17/2017 Document Reviewed: 02/29/2016 Elsevier Patient Education  2020 [employer].

## 2019-06-12 ENCOUNTER — Encounter: Payer: Self-pay | Admitting: Family Medicine

## 2019-06-12 ENCOUNTER — Ambulatory Visit (INDEPENDENT_AMBULATORY_CARE_PROVIDER_SITE_OTHER): Payer: Self-pay | Admitting: Family Medicine

## 2019-06-12 ENCOUNTER — Other Ambulatory Visit: Payer: Self-pay

## 2019-06-12 VITALS — BP 150/100 | HR 69 | Temp 97.9°F | Ht 72.0 in | Wt 204.0 lb

## 2019-06-12 DIAGNOSIS — K219 Gastro-esophageal reflux disease without esophagitis: Secondary | ICD-10-CM

## 2019-06-12 DIAGNOSIS — I10 Essential (primary) hypertension: Secondary | ICD-10-CM

## 2019-06-12 DIAGNOSIS — F5101 Primary insomnia: Secondary | ICD-10-CM

## 2019-06-12 DIAGNOSIS — E785 Hyperlipidemia, unspecified: Secondary | ICD-10-CM

## 2019-06-12 MED ORDER — HYDROCHLOROTHIAZIDE 25 MG PO TABS: 25.0000 mg | ORAL_TABLET | Freq: Every day | ORAL | 1 refills | Status: DC

## 2019-06-12 MED ORDER — PANTOPRAZOLE SODIUM 20 MG PO TBEC: 20.0000 mg | DELAYED_RELEASE_TABLET | Freq: Every day | ORAL | 5 refills | Status: DC

## 2019-06-12 MED ORDER — HYDROXYZINE HCL 10 MG PO TABS: 10.0000 mg | ORAL_TABLET | Freq: Every day | ORAL | 0 refills | Status: DC

## 2019-06-12 NOTE — Assessment & Plan Note (Signed)
Reports taking medication as directed.  Has been working to lose weight.  He has been trying to avoid salt but still continues to have salt in his diet.  Will add hydrochlorothiazide to medication regime.  And follow-up within 4 to 6 weeks to see if he is gotten any better.  Strict precautions on when to go to the emergency room.  Reviewed side effects, risks and benefits of medication.   Patient acknowledged agreement and understanding of the plan.  He is strongly encouraged to make sure that he is walking or exercise at least 30 to 60 minutes a day.  Currently not working at this time.  The lifestyle and diet changes are the most stressed and educated factors.

## 2019-06-12 NOTE — Assessment & Plan Note (Signed)
Will be getting updated labs in the next week.  Continue current medications as prescribed.  Encourage low-fat diet.

## 2019-06-12 NOTE — Assessment & Plan Note (Addendum)
He reports melatonin did not help him.  Will do low-dose Atarax to see if that will help.  Advised for him to try to work to get his blood pressure under control as that can cause difficulty with rest as well.  Reviewed side effects, risks and benefits of medication.   Patient acknowledged agreement and understanding of the plan.

## 2019-06-12 NOTE — Progress Notes (Signed)
Subjective:  Patient ID: Corey Thomas, male    DOB: December 04, 1965  Age: 54 y.o. MRN: [id number]  CC:  Chief Complaint  Patient presents with  . Insomnia    goes to sleep at 7 and wakes back up around midnight and can't go back to sleep for rest of night and the melatonin didn't work    . Hypertension    follow up       HPI  HPI  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  After starting several medications.  Additionally he does use tobacco for dipping.  And drinks about a sixpack a day.  He reports he also has been having some insomnia and he goes to bed so early because he has nothing to do and then wakes up around midnight and is unable to sleep for most of the night.  He reports that he tried melatonin as we discussed last time but it is not work for him.  He denies having any form of exercise reports that he has arthritis and that makes it hard for him.  He reports that he does not really focus on eating anything special but tries to avoid salt but he knows that he has salt in his diet.  Along with drinking and the use of tobacco products. he reports that he takes his medication as directed.  Denies having any chest pain, shortness of breath, palpitations, dizziness, vision changes, headaches or any other signs or symptoms of elevated blood pressure.  He is willing to have a medication adjustment.  And get labs in the next week.  Today patient denies signs and symptoms of COVID 19 infection including fever, chills, cough, shortness of breath, and headache. Past Medical, Surgical, Social History, Allergies, and Medications have been Reviewed.   Past Medical History:  Diagnosis Date  . Arthritis   . Diverticulosis 12/15/2016  . GERD without esophagitis 06/08/2016  . HLD (hyperlipidemia) 07/15/2016  . Hypertension   . Tubular adenoma of colon 11/14/2016  . Vitamin D deficiency     Current  Meds  Medication Sig  . amLODipine (NORVASC) 10 MG tablet Take 1 tablet (10 mg total) by mouth daily.  . benazepril (LOTENSIN) 40 MG tablet Take 1 tablet (40 mg total) by mouth daily.  . pantoprazole (PROTONIX) 20 MG tablet Take 1 tablet (20 mg total) by mouth daily.  . tadalafil (CIALIS) 10 MG tablet Take 1 tablet (10 mg total) by mouth daily as needed for erectile dysfunction.  . [DISCONTINUED] pantoprazole (PROTONIX) 20 MG tablet Take 1 tablet (20 mg total) by mouth daily.    ROS:  Review of Systems  Constitutional: Negative.   HENT: Negative.   Eyes: Negative.   Respiratory: Negative.   Cardiovascular: Negative.   Gastrointestinal: Negative.   Genitourinary: Negative.   Musculoskeletal: Negative.   Skin: Negative.   Neurological: Negative.   Endo/Heme/Allergies: Negative.   Psychiatric/Behavioral: Negative.   All other systems reviewed and are negative.    Objective:   Today's Vitals: BP (!) 150/100   Pulse 69   Temp 97.9 F (36.6 C) (Temporal)   Ht 6' (1.829 m)   Wt 204 lb (92.5 kg)   SpO2 100%   BMI 27.67 kg/m  Vitals with BMI 06/12/2019 06/12/2019 04/16/2019  Height - 6\' 0"  -  Weight - 204 lbs -  BMI - 99991111 -  Systolic Q000111Q Q000111Q  Q000111Q  Diastolic 123XX123 123XX123 90  Pulse - 69 -     Physical Exam Vitals and nursing note reviewed.  Constitutional:      Appearance: Normal appearance. He is well-developed, well-groomed and overweight.  HENT:     Head: Normocephalic and atraumatic.     Right Ear: External ear normal.     Left Ear: External ear normal.     Nose: Nose normal.     Mouth/Throat:     Mouth: Mucous membranes are moist.     Pharynx: Oropharynx is clear.  Eyes:     General:        Right eye: No discharge.        Left eye: No discharge.     Conjunctiva/sclera: Conjunctivae normal.  Cardiovascular:     Rate and Rhythm: Normal rate and regular rhythm.     Pulses: Normal pulses.     Heart sounds: Normal heart sounds.  Pulmonary:     Effort: Pulmonary  effort is normal.     Breath sounds: Normal breath sounds.  Musculoskeletal:        General: Normal range of motion.     Cervical back: Normal range of motion and neck supple.  Skin:    General: Skin is warm.  Neurological:     General: No focal deficit present.     Mental Status: He is alert and oriented to person, place, and time.  Psychiatric:        Attention and Perception: Attention normal.        Mood and Affect: Mood normal.        Speech: Speech normal.        Behavior: Behavior normal. Behavior is cooperative.        Thought Content: Thought content normal.        Cognition and Memory: Cognition normal.        Judgment: Judgment normal.      Assessment   1. Essential hypertension   2. GERD without esophagitis   3. Primary insomnia   4. Hyperlipidemia, unspecified hyperlipidemia type     Tests ordered Orders Placed This Encounter  Procedures  . CBC  . COMPLETE METABOLIC PANEL WITH GFR  . Lipid panel    Plan: Please see assessment and plan per problem list above.   Meds ordered this encounter  Medications  . pantoprazole (PROTONIX) 20 MG tablet    Sig: Take 1 tablet (20 mg total) by mouth daily.    Dispense:  30 tablet    Refill:  5  . hydrochlorothiazide (HYDRODIURIL) 25 MG tablet    Sig: Take 1 tablet (25 mg total) by mouth daily.    Dispense:  30 tablet    Refill:  1    Order Specific Question:   Supervising Provider    Answer:   SIMPSON, MARGARET E [id number]  . hydrOXYzine (ATARAX/VISTARIL) 10 MG tablet    Sig: Take 1 tablet (10 mg total) by mouth at bedtime.    Dispense:  30 tablet    Refill:  0    Order Specific Question:   Supervising Provider    Answer:   Fayrene Helper [id number]    Patient to follow-up in 6 weeks.  Perlie Mayo, NP

## 2019-06-12 NOTE — Assessment & Plan Note (Signed)
Encouraged the use of alcohol abstinence.  Will provide refill of Protonix at this time.

## 2019-06-12 NOTE — Patient Instructions (Addendum)
I appreciate the opportunity to provide you with care for your health and wellness. Today we discussed: BP   Follow up: 6 weeks   Labs placed today for fasting draw within the week   Pick up meds take as directed.  Please continue to practice social distancing to keep you, your family, and our community safe.  If you must go out, please wear a mask and practice good handwashing.  It was a pleasure to see you and I look forward to continuing to work together on your health and well-being. Please do not hesitate to call the office if you need care or have questions about your care.  Have a wonderful day and week. With Gratitude, Cherly Beach, DNP, AGNP-BC

## 2019-07-24 ENCOUNTER — Telehealth: Payer: Self-pay

## 2019-07-24 ENCOUNTER — Ambulatory Visit: Payer: Self-pay | Admitting: Family Medicine

## 2019-07-24 ENCOUNTER — Other Ambulatory Visit: Payer: Self-pay | Admitting: *Deleted

## 2019-07-24 DIAGNOSIS — I10 Essential (primary) hypertension: Secondary | ICD-10-CM

## 2019-07-24 MED ORDER — BENAZEPRIL HCL 40 MG PO TABS: 40.0000 mg | ORAL_TABLET | Freq: Every day | ORAL | 3 refills | Status: DC

## 2019-07-24 NOTE — Telephone Encounter (Signed)
Please send in benazepril (LOTENSIN) 40 MG tablet to [facility]

## 2019-07-24 NOTE — Telephone Encounter (Signed)
This has been sent to [facility]

## 2019-08-01 ENCOUNTER — Ambulatory Visit (INDEPENDENT_AMBULATORY_CARE_PROVIDER_SITE_OTHER): Payer: BC Managed Care – PPO | Admitting: Family Medicine

## 2019-08-01 ENCOUNTER — Encounter: Payer: Self-pay | Admitting: Family Medicine

## 2019-08-01 ENCOUNTER — Other Ambulatory Visit: Payer: Self-pay

## 2019-08-01 VITALS — BP 125/86 | HR 80 | Temp 97.6°F | Ht 72.0 in | Wt 201.8 lb

## 2019-08-01 DIAGNOSIS — I10 Essential (primary) hypertension: Secondary | ICD-10-CM

## 2019-08-01 DIAGNOSIS — F5101 Primary insomnia: Secondary | ICD-10-CM | POA: Diagnosis not present

## 2019-08-01 DIAGNOSIS — E663 Overweight: Secondary | ICD-10-CM

## 2019-08-01 NOTE — Assessment & Plan Note (Signed)
Will increase Atarax dose to 30 mg nightly.  Provided him with sleep education to review.  And focus on setting a pattern to help his brain know when it is time to be tired versus when it is time to be awake.  In addition encouraged him to get sunshine and exercise and is daily routine to help with hormone production of melatonin in the evening.  Patient acknowledged agreement and understanding of the plan.

## 2019-08-01 NOTE — Patient Instructions (Signed)
I appreciate the opportunity to provide you with care for your health and wellness. Today we discussed: blood pressure and sleep  Follow up: 4 months for BP  No labs or referrals today  IM SO HAPPY [place]!  Continue to take your medications and the diet/lifestlye changes you have started :)  Increase hydroxyzine to 30mg   Nightly to see if that helps with sleep. Call Monday and let me know how if works for you.  Please continue to practice social distancing to keep you, your family, and our community safe.  If you must go out, please wear a mask and practice good handwashing.  It was a pleasure to see you and I look forward to continuing to work together on your health and well-being. Please do not hesitate to call the office if you need care or have questions about your care.  Have a wonderful day and week. With Gratitude, Cherly Beach, DNP, AGNP-BC  Teens need about 9 hours of sleep a night. Younger children need more sleep (10-11 hours a night) and adults need slightly less (7-9 hours each night). 11 Tips to Follow: 1. No caffeine after 3pm: Avoid beverages with caffeine (soda, tea, energy drinks, etc.) especially after 3pm.  2. Don't go to bed hungry: Have your evening meal at least 3 hrs. before going to sleep. It's fine to have a small bedtime snack such as a glass of milk and a few crackers but don't have a big meal.  3. Have a nightly routine before bed: Plan on "winding down" before you go to sleep. Begin relaxing about 1 hour before you go to bed. Try doing a quiet activity such as listening to calming music, reading a book or meditating.  4. Turn off the TV and ALL electronics including video games, tablets, laptops, etc. 1 hour before sleep, and keep them out of the bedroom.  5. Turn off your cell phone and all notifications (new email and text alerts) or even better, leave your phone outside your room while you sleep. Studies have  shown that a part of your brain continues to respond to certain lights and sounds even while you're still asleep.  6. Make your bedroom quiet, dark and cool. If you can't control the noise, try wearing earplugs or using a fan to block out other sounds.  7. Practice relaxation techniques. Try reading a book or meditating or drain your brain by writing a list of what you need to do the next day.  8. Don't nap unless you feel sick: you'll have a better night's sleep.  9. Don't smoke, or quit if you do. Nicotine, alcohol, and marijuana can all keep you awake. Talk to your health care provider if you need help with substance use.  10. Most importantly, wake up at the same time every day (or within 1 hour of your usual wake up time) EVEN on the weekends. A regular wake up time promotes sleep hygiene and prevents sleep problems.  11. Reduce exposure to bright light in the last three hours of the day before going to sleep.  Maintaining good sleep hygiene and having good sleep habits lower your risk of developing sleep problems. Getting better sleep can also improve your concentration and alertness. Try the simple steps in this guide. If you still have trouble getting enough rest, make an appointment with your health care provider.

## 2019-08-01 NOTE — Progress Notes (Signed)
Subjective:  Patient ID: Corey Thomas, male    DOB: 1965-04-19  Age: 54 y.o. MRN: [id number]  CC:  Chief Complaint  Patient presents with  . Follow-up    follow up on bp is better today has been cutting back on fried foods and sodas and salt   . Insomnia    pt states that he is not hydroxyzine is not working so he has stopped taking it       HPI  HPI   Corey Thomas is a 54 year old male patient of mine.  Who presents today for follow-up on high blood pressure.  At his last visit he had the addition of hydrochlorothiazide to help with blood pressure control.  He demonstrates good control today in the office reports that he has been avoiding fried foods, cutting back on sodas and salt intake.  Has not really focused on exercise but has been trying his best to focus on diet control of things.  He denies having any headaches, chest pain, vision changes, dizziness, leg swelling, palpitations, shortness of breath or any other signs or symptoms of uncontrolled blood pressure at this time.  He reports that he does not have any side effects of the medications and does not have any problems continuing to take them.  Had history of compliant concerns in the past but overall has taken very good control over his health and is trying to make sure that he does right by himself.  At last visit in March we had talked about his insomnia again as he reported that melatonin did not help him.  We chose to do low-dose Atarax to see if that would help today he reports that those did not help him at all.  And that sometimes he will sleep for about 6 hours at a time his sleeping hours have varied so he is not very consistent in keeping a pattern of sleep which could be attributing an issue as well.  Sometimes he will go to sleep around 637 wake up around midnight, sometimes he will go to sleep around 9 or 10 and wake up around 1 sometimes 4 he does not have a consistent pattern of waking up or going to bed which  was discussed today in the office.  He is willing to try higher dose of Atarax to see if that is beneficial and then follow-up within a week to see if there needs to be any adjustment or changes to medication regime.  Earlier in the year he reported that he had increased weight from last fall.  Today he is happy to report that he has lost 6 pounds.  And overall he is doing much better with his diet and trying to work on continuing to lose some weight.  He is not currently actively working out as much as he used to.  He is also not working which is chronic contributed to him having more weight on him in the last year.  He reports he would like to get back down to where he was which was in the 190 range but not loses muscle mass.  Today patient denies signs and symptoms of COVID 19 infection including fever, chills, cough, shortness of breath, and headache. Past Medical, Surgical, Social History, Allergies, and Medications have been Reviewed.   Past Medical History:  Diagnosis Date  . Arthritis   . Diverticulosis 12/15/2016  . GERD without esophagitis 06/08/2016  . HLD (hyperlipidemia) 07/15/2016  . Hypertension   .  Tubular adenoma of colon 11/14/2016  . Vitamin D deficiency     Current Meds  Medication Sig  . amLODipine (NORVASC) 10 MG tablet Take 1 tablet (10 mg total) by mouth daily.  . benazepril (LOTENSIN) 40 MG tablet Take 1 tablet (40 mg total) by mouth daily.  . hydrochlorothiazide (HYDRODIURIL) 25 MG tablet Take 1 tablet (25 mg total) by mouth daily.  . pantoprazole (PROTONIX) 20 MG tablet Take 1 tablet (20 mg total) by mouth daily.  . tadalafil (CIALIS) 10 MG tablet Take 1 tablet (10 mg total) by mouth daily as needed for erectile dysfunction.    ROS:  Review of Systems  Constitutional: Negative.   HENT: Negative.   Eyes: Negative.   Respiratory: Negative.   Cardiovascular: Negative.   Gastrointestinal: Negative.   Genitourinary: Negative.   Musculoskeletal: Negative.     Skin: Negative.   Neurological: Negative.   Endo/Heme/Allergies: Negative.   Psychiatric/Behavioral: The patient has insomnia.   All other systems reviewed and are negative.    Objective:   Today's Vitals: BP 125/86 (BP Location: Right Arm, Patient Position: Sitting, Cuff Size: Normal)   Pulse 80   Temp 97.6 F (36.4 C) (Oral)   Ht 6' (1.829 m)   Wt 201 lb 12.8 oz (91.5 kg)   SpO2 95%   BMI 27.37 kg/m  Vitals with BMI 08/01/2019 06/12/2019 06/12/2019  Height 6\' 0"  - 6\' 0"   Weight 201 lbs 13 oz - 204 lbs  BMI [id number] - [id number]  Systolic 0000000 [id number] [id number]  Diastolic 86 [id number]  Pulse 80 - 69     Physical Exam Vitals and nursing note reviewed.  Constitutional:      Appearance: Normal appearance. He is well-developed, well-groomed and overweight.  HENT:     Head: Normocephalic and atraumatic.     Right Ear: External ear normal.     Left Ear: External ear normal.     Mouth/Throat:     Comments: Mask in place Eyes:     General:        Right eye: No discharge.        Left eye: No discharge.     Conjunctiva/sclera: Conjunctivae normal.  Cardiovascular:     Rate and Rhythm: Normal rate and regular rhythm.     Pulses: Normal pulses.     Heart sounds: Normal heart sounds.  Pulmonary:     Effort: Pulmonary effort is normal.     Breath sounds: Normal breath sounds.  Musculoskeletal:        General: Normal range of motion.     Cervical back: Normal range of motion and neck supple.  Skin:    General: Skin is warm.  Neurological:     General: No focal deficit present.     Mental Status: He is alert and oriented to person, place, and time.  Psychiatric:        Attention and Perception: Attention normal.        Mood and Affect: Mood normal.        Speech: Speech normal.        Behavior: Behavior normal. Behavior is cooperative.        Thought Content: Thought content normal.        Cognition and Memory: Cognition normal.        Judgment: Judgment normal.     Comments: Good eye  contact, good communication pleasant communication          Assessment   1. Essential hypertension  2. Primary insomnia   3. Overweight (BMI 25.0-29.9)     Tests ordered No orders of the defined types were placed in this encounter.    Plan: Please see assessment and plan per problem list above.   No orders of the defined types were placed in this encounter.   Patient to follow-up in 4 months.  Perlie Mayo, NP

## 2019-08-01 NOTE — Assessment & Plan Note (Signed)
Much improved blood pressure control with the addition of hydrochlorothiazide last time.  He has been working to cut back on fried foods, sodas, salt as well.  He reports tolerating the medications and taking them as directed.  And without any issue.  I have strongly encouraged him to continue to do these lifestyle and diet changes.  And to focus on working out at least 30 to 60 minutes on most days of the week.  We will continue current medications as prescribed.

## 2019-10-04 IMAGING — DX CHEST - 2 VIEW
2 series · 2 of 2 positions shown · non-contrast
Comparison: Chest radiograph 03/26/2014

CLINICAL DATA: Patient with dizziness.

EXAM:
CHEST - 2 VIEW

[chest pa]
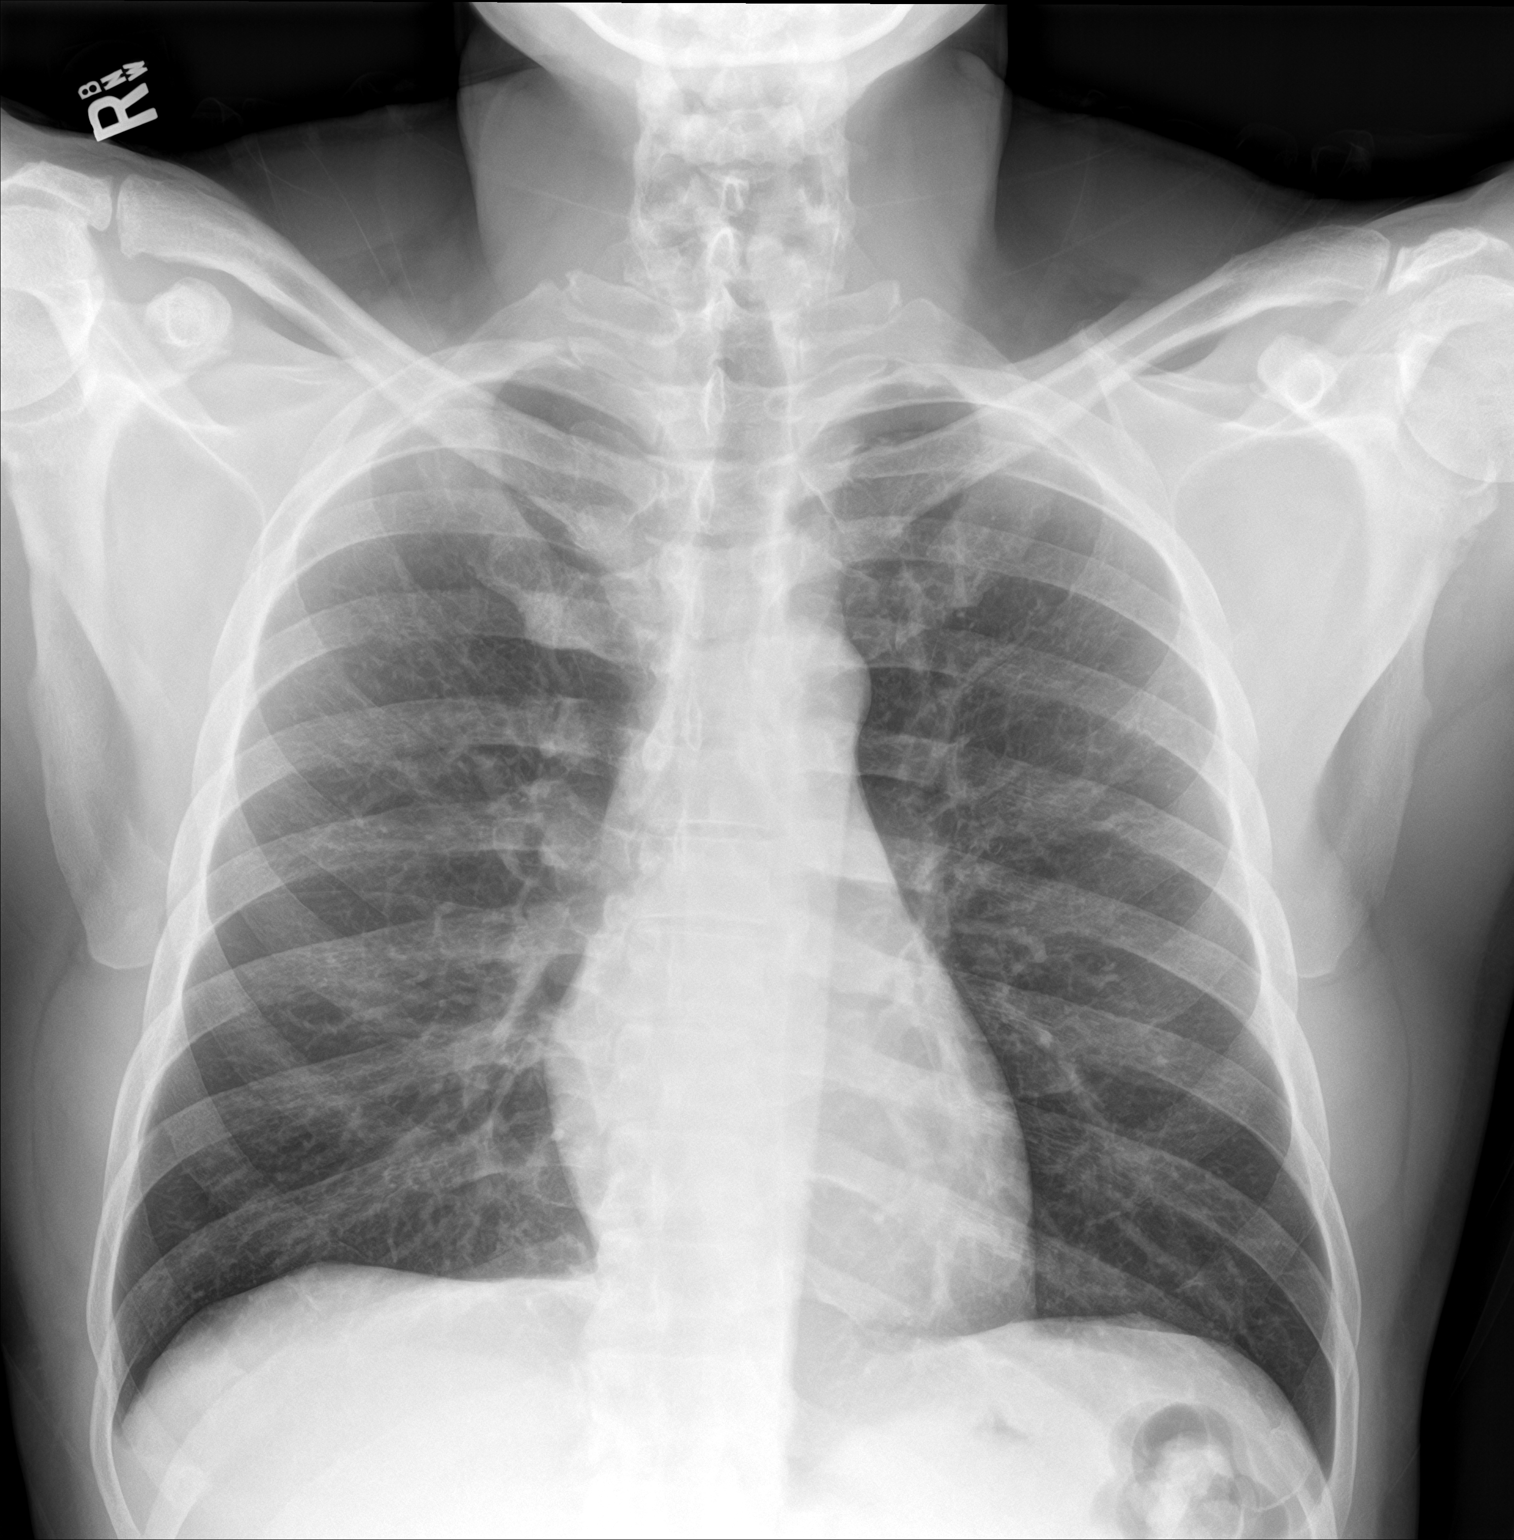

[chest lat]
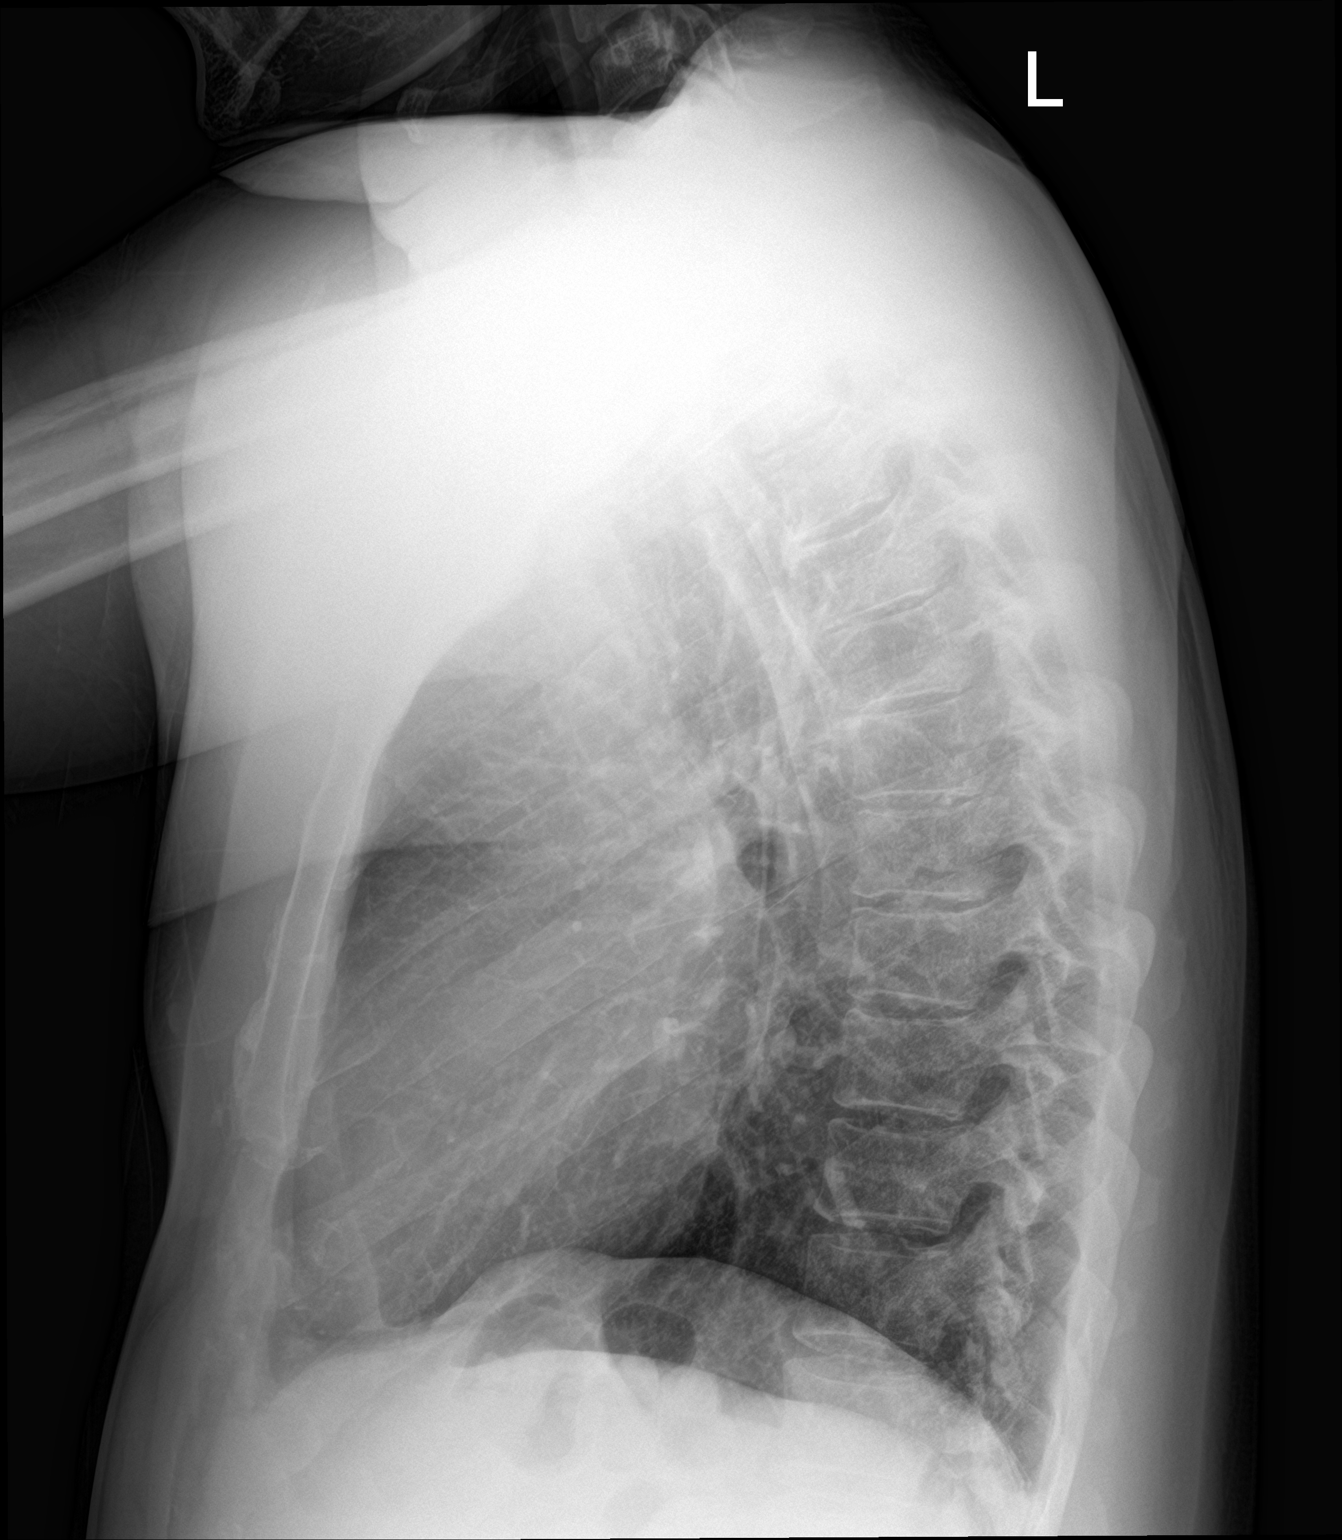

[2 of 2 positions shown; findings below may reference images not displayed]

FINDINGS: Stable cardiac and mediastinal contours. No consolidative pulmonary
opacities. No pleural effusion or pneumothorax.
IMPRESSION: No active cardiopulmonary disease.

## 2019-10-10 ENCOUNTER — Encounter (INDEPENDENT_AMBULATORY_CARE_PROVIDER_SITE_OTHER): Payer: Self-pay | Admitting: *Deleted

## 2019-12-03 ENCOUNTER — Ambulatory Visit: Payer: BC Managed Care – PPO | Admitting: Family Medicine

## 2020-03-02 ENCOUNTER — Other Ambulatory Visit: Payer: Self-pay

## 2020-03-02 ENCOUNTER — Encounter (HOSPITAL_COMMUNITY): Payer: Self-pay | Admitting: Emergency Medicine

## 2020-03-02 ENCOUNTER — Emergency Department (HOSPITAL_COMMUNITY)
Admission: EM | Admit: 2020-03-02 | Discharge: 2020-03-02 | Disposition: A | Discharge status: Home / Self Care | Payer: BC Managed Care – PPO | Attending: Emergency Medicine | Admitting: Emergency Medicine

## 2020-03-02 DIAGNOSIS — Z79899 Other long term (current) drug therapy: Secondary | ICD-10-CM | POA: Diagnosis not present

## 2020-03-02 DIAGNOSIS — K219 Gastro-esophageal reflux disease without esophagitis: Secondary | ICD-10-CM | POA: Diagnosis not present

## 2020-03-02 DIAGNOSIS — R1033 Periumbilical pain: Secondary | ICD-10-CM | POA: Insufficient documentation

## 2020-03-02 DIAGNOSIS — I10 Essential (primary) hypertension: Secondary | ICD-10-CM | POA: Diagnosis not present

## 2020-03-02 DIAGNOSIS — R10815 Periumbilic abdominal tenderness: Secondary | ICD-10-CM | POA: Diagnosis not present

## 2020-03-02 LAB — LIPASE, BLOOD: Lipase: 38 U/L (ref 11–51)

## 2020-03-02 LAB — URINALYSIS, ROUTINE W REFLEX MICROSCOPIC
Bacteria, UA: NONE SEEN
Bilirubin Urine: NEGATIVE
Glucose, UA: NEGATIVE mg/dL
Ketones, ur: NEGATIVE mg/dL
Leukocytes,Ua: NEGATIVE
Nitrite: NEGATIVE
Protein, ur: NEGATIVE mg/dL
Specific Gravity, Urine: 1.01 (ref 1.005–1.030)
pH: 6 (ref 5.0–8.0)

## 2020-03-02 LAB — CBC WITH DIFFERENTIAL/PLATELET
Abs Immature Granulocytes: 0.02 10*3/uL (ref 0.00–0.07)
Basophils Absolute: 0 10*3/uL (ref 0.0–0.1)
Basophils Relative: 0 %
Eosinophils Absolute: 0.2 10*3/uL (ref 0.0–0.5)
Eosinophils Relative: 4 %
HCT: 43.7 % (ref 39.0–52.0)
Hemoglobin: 15.4 g/dL (ref 13.0–17.0)
Immature Granulocytes: 0 %
Lymphocytes Relative: 30 %
Lymphs Abs: 1.5 10*3/uL (ref 0.7–4.0)
MCH: 32.3 pg (ref 26.0–34.0)
MCHC: 35.2 g/dL (ref 30.0–36.0)
MCV: 91.6 fL (ref 80.0–100.0)
Monocytes Absolute: 0.6 10*3/uL (ref 0.1–1.0)
Monocytes Relative: 12 %
Neutro Abs: 2.7 10*3/uL (ref 1.7–7.7)
Neutrophils Relative %: 54 %
Platelets: 214 10*3/uL (ref 150–400)
RBC: 4.77 MIL/uL (ref 4.22–5.81)
RDW: 12.9 % (ref 11.5–15.5)
WBC: 5 10*3/uL (ref 4.0–10.5)
nRBC: 0 % (ref 0.0–0.2)

## 2020-03-02 LAB — COMPREHENSIVE METABOLIC PANEL
ALT: 24 U/L (ref 0–44)
AST: 25 U/L (ref 15–41)
Albumin: 4 g/dL (ref 3.5–5.0)
Alkaline Phosphatase: 54 U/L (ref 38–126)
Anion gap: 7 (ref 5–15)
BUN: 13 mg/dL (ref 6–20)
CO2: 24 mmol/L (ref 22–32)
Calcium: 8.5 mg/dL — ABNORMAL LOW (ref 8.9–10.3)
Chloride: 103 mmol/L (ref 98–111)
Creatinine, Ser: 0.95 mg/dL (ref 0.61–1.24)
GFR, Estimated: >60 mL/min (ref >60)
Glucose, Bld: 123 mg/dL — ABNORMAL HIGH (ref 70–99)
Potassium: 3.3 mmol/L — ABNORMAL LOW (ref 3.5–5.1)
Sodium: 134 mmol/L — ABNORMAL LOW (ref 135–145)
Total Bilirubin: 0.4 mg/dL (ref 0.3–1.2)
Total Protein: 7.6 g/dL (ref 6.5–8.1)

## 2020-03-02 LAB — RAPID URINE DRUG SCREEN, HOSP PERFORMED
Amphetamines: NOT DETECTED
Barbiturates: NOT DETECTED
Benzodiazepines: NOT DETECTED
Cocaine: NOT DETECTED
Opiates: NOT DETECTED
Tetrahydrocannabinol: NOT DETECTED

## 2020-03-02 LAB — ETHANOL: Alcohol, Ethyl (B): <10 mg/dL (ref <10)

## 2020-03-02 LAB — LACTIC ACID, PLASMA: Lactic Acid, Venous: 0.9 mmol/L (ref 0.5–1.9)

## 2020-03-02 MED ORDER — POTASSIUM CHLORIDE CRYS ER 20 MEQ PO TBCR
40.0000 meq | EXTENDED_RELEASE_TABLET | Freq: Once | ORAL | Status: AC
  Administered 2020-03-02: 40 meq via ORAL
  Filled 2020-03-02: qty 2

## 2020-03-02 MED ORDER — SODIUM CHLORIDE 0.9 % IV BOLUS
1000.0000 mL | Freq: Once | INTRAVENOUS | Status: AC
  Administered 2020-03-02: 1000 mL via INTRAVENOUS

## 2020-03-02 NOTE — ED Notes (Signed)
Patient discharged home to caregiver.  All discharge instructions reviewed.  Patient able to verbalize understanding via teachback method.  Ambulatory out of ED.

## 2020-03-02 NOTE — ED Triage Notes (Signed)
Pt with intermittent abdominal pain that started when he woke up this morning. Describes pain as "stabbing" in nature. Pt took 2 Tylenol without symptom relief.

## 2020-03-02 NOTE — Discharge Instructions (Addendum)
Drink plenty of fluids today.  Eat a bland diet today, avoid anything fried, spicy, or greasy.  Also avoid alcohol for the next couple days.  Make sure you are taking your pantoprazole for the next couple weeks. YOU NEED TO TAKE YOUR BLOOD PRESSURE MEDICATION WHEN YOU GET HOME AND EVERY DAY!!!  Not taking your blood pressure medication is go to make you have a stroke or need to be on dialysis at a very young age.

## 2020-03-02 NOTE — ED Provider Notes (Signed)
[facility] EMERGENCY DEPARTMENT Provider Note   CSN: [id number] Arrival date & time: 03/02/20  0244   Time seen 3:42 AM  History Chief Complaint  Patient presents with  . Abdominal Pain    Corey Thomas is a 54 y.o. male.  HPI   Patient states he felt fine when he went to bed on December 12.  He states he woke up this morning about 1 AM with periumbilical pain that comes and goes and he describes as a stabbing pain that lasts 1 to 2 min.  He denies nausea, vomiting, diarrhea, constipation, or fever.  He states the pain does not radiate.  He states he has had about 6-7 episodes since it started.  Nothing he does makes it hurt more, nothing he does makes it feel better.  He states he ate his usual diet today and he also drank about 7 beers which is typical.  Patient states he is on blood pressure medication however he states he did not take his blood pressure pills today and actually has not taken any since about November 6.  He cannot say why he does not take it.  We had a discussion that he needs to take it because it does increase his risk of stroke and being on dialysis.  PCP Perlie Mayo, NP   Past Medical History:  Diagnosis Date  . Arthritis   . Diverticulosis 12/15/2016  . GERD without esophagitis 06/08/2016  . HLD (hyperlipidemia) 07/15/2016  . Hypertension   . Tubular adenoma of colon 11/14/2016  . Vitamin D deficiency     Patient Active Problem List   Diagnosis Date Noted  . Overweight (BMI 25.0-29.9) 04/16/2019  . Erectile dysfunction 03/12/2019  . Primary osteoarthritis of right knee 02/21/2017  . Primary insomnia 02/21/2017  . HLD (hyperlipidemia) 07/15/2016  . Noncompliance with medications 06/08/2016  . Tobacco dipper 06/08/2016  . GERD without esophagitis 06/08/2016  . Monocular visual disturbance 06/08/2016  . Essential hypertension 10/14/2013    Past Surgical History:  Procedure Laterality Date  . COLONOSCOPY N/A 11/11/2016   Procedure:  COLONOSCOPY;  Surgeon: Rogene Houston, MD;  Location: [facility];  Service: Endoscopy;  Laterality: N/A;  12:55-rescheduled to 8/24 @ 10:25am per Lelon Frohlich  . NO PAST SURGERIES         Family History  Problem Relation Age of Onset  . Healthy Sister   . Healthy Sister   . Alcohol abuse Brother 41       cirrhosis  . Diabetes Other   . Osteoarthritis Other   . Arthritis Maternal Grandmother     Social History   Tobacco Use  . Smoking status: Never Smoker  . Smokeless tobacco: Current User    Types: Snuff  . Tobacco comment: 1/2 can daily   Vaping Use  . Vaping Use: Never used  Substance Use Topics  . Alcohol use: Yes    Comment: occasional  . Drug use: No  employed  Home Medications Prior to Admission medications   Medication Sig Start Date End Date Taking? Authorizing Provider  amLODipine (NORVASC) 10 MG tablet Take 1 tablet (10 mg total) by mouth daily. 10/11/18   Perlie Mayo, NP  benazepril (LOTENSIN) 40 MG tablet Take 1 tablet (40 mg total) by mouth daily. 07/24/19   Perlie Mayo, NP  hydrochlorothiazide (HYDRODIURIL) 25 MG tablet Take 1 tablet (25 mg total) by mouth daily. 06/12/19   Perlie Mayo, NP  hydrOXYzine (ATARAX/VISTARIL) 10 MG tablet Take  1 tablet (10 mg total) by mouth at bedtime. Patient not taking: Reported on 08/01/2019 06/12/19   Perlie Mayo, NP  pantoprazole (PROTONIX) 20 MG tablet Take 1 tablet (20 mg total) by mouth daily. 06/12/19   Perlie Mayo, NP  tadalafil (CIALIS) 10 MG tablet Take 1 tablet (10 mg total) by mouth daily as needed for erectile dysfunction. 03/12/19   Perlie Mayo, NP    Allergies    Tramadol  Review of Systems   Review of Systems  All other systems reviewed and are negative.   Physical Exam Updated Vital Signs BP (!) 187/110 (BP Location: Left Arm)   Pulse (!) 59   Temp 98 F (36.7 C) (Oral)   Resp 18   Ht 6' (1.829 m)   Wt 93 kg   SpO2 99%   BMI 27.80 kg/m   Physical Exam Vitals and nursing note  reviewed.  Constitutional:      General: He is not in acute distress.    Appearance: Normal appearance. He is normal weight. He is not ill-appearing or toxic-appearing.  HENT:     Head: Normocephalic and atraumatic.     Right Ear: External ear normal.     Left Ear: External ear normal.     Nose: Nose normal.  Eyes:     Extraocular Movements: Extraocular movements intact.     Conjunctiva/sclera: Conjunctivae normal.     Pupils: Pupils are equal, round, and reactive to light.  Cardiovascular:     Rate and Rhythm: Normal rate.  Pulmonary:     Effort: Pulmonary effort is normal. No respiratory distress.  Abdominal:     General: Bowel sounds are normal.     Palpations: Abdomen is soft.     Tenderness: There is no abdominal tenderness.  Musculoskeletal:        General: Normal range of motion.     Cervical back: Normal range of motion.  Skin:    General: Skin is warm and dry.     Coloration: Skin is not jaundiced.  Neurological:     General: No focal deficit present.     Mental Status: He is alert and oriented to person, place, and time.     Cranial Nerves: No cranial nerve deficit.  Psychiatric:        Mood and Affect: Mood normal.        Behavior: Behavior normal.        Thought Content: Thought content normal.     ED Results / Procedures / Treatments   Labs (all labs ordered are listed, but only abnormal results are displayed) Results for orders placed or performed during the hospital encounter of 03/02/20  Comprehensive metabolic panel  Result Value Ref Range   Sodium 134 (L) 135 - 145 mmol/L   Potassium 3.3 (L) 3.5 - 5.1 mmol/L   Chloride 103 98 - 111 mmol/L   CO2 24 22 - 32 mmol/L   Glucose, Bld 123 (H) 70 - 99 mg/dL   BUN 13 6 - 20 mg/dL   Creatinine, Ser 0.95 0.61 - 1.24 mg/dL   Calcium 8.5 (L) 8.9 - 10.3 mg/dL   Total Protein 7.6 6.5 - 8.1 g/dL   Albumin 4.0 3.5 - 5.0 g/dL   AST 25 15 - 41 U/L   ALT 24 0 - 44 U/L   Alkaline Phosphatase 54 38 - 126 U/L    Total Bilirubin 0.4 0.3 - 1.2 mg/dL   GFR, Estimated >60 >60 mL/min  Anion gap 7 5 - 15  Ethanol  Result Value Ref Range   Alcohol, Ethyl (B) <10 <10 mg/dL  Lipase, blood  Result Value Ref Range   Lipase 38 11 - 51 U/L  CBC with Differential  Result Value Ref Range   WBC 5.0 4.0 - 10.5 K/uL   RBC 4.77 4.22 - 5.81 MIL/uL   Hemoglobin 15.4 13.0 - 17.0 g/dL   HCT 43.7 39.0 - 52.0 %   MCV 91.6 80.0 - 100.0 fL   MCH 32.3 26.0 - 34.0 pg   MCHC 35.2 30.0 - 36.0 g/dL   RDW 12.9 11.5 - 15.5 %   Platelets 214 150 - 400 K/uL   nRBC 0.0 0.0 - 0.2 %   Neutrophils Relative % 54 %   Neutro Abs 2.7 1.7 - 7.7 K/uL   Lymphocytes Relative 30 %   Lymphs Abs 1.5 0.7 - 4.0 K/uL   Monocytes Relative 12 %   Monocytes Absolute 0.6 0.1 - 1.0 K/uL   Eosinophils Relative 4 %   Eosinophils Absolute 0.2 0.0 - 0.5 K/uL   Basophils Relative 0 %   Basophils Absolute 0.0 0.0 - 0.1 K/uL   Immature Granulocytes 0 %   Abs Immature Granulocytes 0.02 0.00 - 0.07 K/uL  Lactic acid, plasma  Result Value Ref Range   Lactic Acid, Venous 0.9 0.5 - 1.9 mmol/L  Urinalysis, Routine w reflex microscopic Urine, Clean Catch  Result Value Ref Range   Color, Urine STRAW (A) YELLOW   APPearance CLEAR CLEAR   Specific Gravity, Urine 1.010 1.005 - 1.030   pH 6.0 5.0 - 8.0   Glucose, UA NEGATIVE NEGATIVE mg/dL   Hgb urine dipstick SMALL (A) NEGATIVE   Bilirubin Urine NEGATIVE NEGATIVE   Ketones, ur NEGATIVE NEGATIVE mg/dL   Protein, ur NEGATIVE NEGATIVE mg/dL   Nitrite NEGATIVE NEGATIVE   Leukocytes,Ua NEGATIVE NEGATIVE   RBC / HPF 0-5 0 - 5 RBC/hpf   WBC, UA 0-5 0 - 5 WBC/hpf   Bacteria, UA NONE SEEN NONE SEEN  Urine rapid drug screen (hosp performed)  Result Value Ref Range   Opiates NONE DETECTED NONE DETECTED   Cocaine NONE DETECTED NONE DETECTED   Benzodiazepines NONE DETECTED NONE DETECTED   Amphetamines NONE DETECTED NONE DETECTED   Tetrahydrocannabinol NONE DETECTED NONE DETECTED   Barbiturates NONE  DETECTED NONE DETECTED   Laboratory interpretation all normal except hypokalemia    EKG None  Radiology No results found.  Procedures Procedures (including critical care time)  Medications Ordered in ED Medications  sodium chloride 0.9 % bolus 1,000 mL (0 mLs Intravenous Stopped 03/02/20 0447)  potassium chloride SA (KLOR-CON) CR tablet 40 mEq (40 mEq Oral Given 03/02/20 0503)    ED Course  I have reviewed the triage vital signs and the nursing notes.  Pertinent labs & imaging results that were available during my care of the patient were reviewed by me and considered in my medical decision making (see chart for details).    MDM Rules/Calculators/A&P                          Patient is currently pain-free, he was advised to let nursing staff know if his pain returns.  Laboratory testing was done.  Patient is noted to be hypertensive 196/117.  Patient's mild hypokalemia was treated with potassium 40 mEq orally.  Recheck at 5:20 AM patient is sleeping, easily awakened.  He states he has had some  minor episodes since I saw him but not as bad as they were before.  We discussed his test results.  He feels good enough to be discharged.  Patient's wife is in the room and she states she tells him he needs to take his medicine.  He states he will take it as soon as he gets home, he has not run out of his pills.  Final Clinical Impression(s) / ED Diagnoses Final diagnoses:  Periumbilical abdominal pain  Primary hypertension    Rx / DC Orders ED Discharge Orders    None     Plan discharge  Rolland Porter, MD, Barbette Or, MD 03/02/20 [phone]

## 2020-03-03 ENCOUNTER — Encounter: Payer: Self-pay | Admitting: Nurse Practitioner

## 2020-03-03 ENCOUNTER — Other Ambulatory Visit: Payer: Self-pay

## 2020-03-03 ENCOUNTER — Ambulatory Visit (INDEPENDENT_AMBULATORY_CARE_PROVIDER_SITE_OTHER): Payer: BC Managed Care – PPO | Admitting: Nurse Practitioner

## 2020-03-03 VITALS — BP 152/91 | HR 71 | Temp 99.2°F | Resp 18 | Ht 72.0 in | Wt 198.0 lb

## 2020-03-03 DIAGNOSIS — Z139 Encounter for screening, unspecified: Secondary | ICD-10-CM

## 2020-03-03 DIAGNOSIS — I10 Essential (primary) hypertension: Secondary | ICD-10-CM | POA: Diagnosis not present

## 2020-03-03 DIAGNOSIS — K219 Gastro-esophageal reflux disease without esophagitis: Secondary | ICD-10-CM

## 2020-03-03 MED ORDER — BENAZEPRIL HCL 40 MG PO TABS
40.0000 mg | ORAL_TABLET | Freq: Every day | ORAL | 3 refills | Status: DC
Start: 2020-03-03 — End: 2020-11-19

## 2020-03-03 MED ORDER — ESOMEPRAZOLE MAGNESIUM 20 MG PO CPDR: 20.0000 mg | DELAYED_RELEASE_CAPSULE | Freq: Every day | ORAL | 3 refills | Status: DC

## 2020-03-03 NOTE — Assessment & Plan Note (Signed)
-  will check HIV and HCV with next set of labs

## 2020-03-03 NOTE — Assessment & Plan Note (Signed)
-  BP elevated today, but much improved since his ED visit yesterday -refilled benazepril

## 2020-03-03 NOTE — Progress Notes (Signed)
Acute Office Visit  Subjective:    Patient ID: Corey Thomas, male    DOB: 11-14-1965, 54 y.o.   MRN: [id number]  Chief Complaint  Patient presents with  . Follow-up  . Hypertension    HPI Patient is in today for HTN.  He was seen in ED yesterday and BP was 187/110.  His potassium was low at 3.3, and he received 40 mEq of PO K.  He needs a refill on benazepril.  He hadn't taken his medication since 01/22/20, but he started his medicines again today and needs refill.  Past Medical History:  Diagnosis Date  . Arthritis   . Diverticulosis 12/15/2016  . GERD without esophagitis 06/08/2016  . HLD (hyperlipidemia) 07/15/2016  . Hypertension   . Tubular adenoma of colon 11/14/2016  . Vitamin D deficiency     Past Surgical History:  Procedure Laterality Date  . COLONOSCOPY N/A 11/11/2016   Procedure: COLONOSCOPY;  Surgeon: Rogene Houston, MD;  Location: [facility];  Service: Endoscopy;  Laterality: N/A;  12:55-rescheduled to 8/24 @ 10:25am per Lelon Frohlich  . NO PAST SURGERIES      Family History  Problem Relation Age of Onset  . Healthy Sister   . Healthy Sister   . Alcohol abuse Brother 41       cirrhosis  . Diabetes Other   . Osteoarthritis Other   . Arthritis Maternal Grandmother     Social History   Socioeconomic History  . Marital status: Married    Spouse name: Not on file  . Number of children: 2  . Years of education: 91  . Highest education level: Not on file  Occupational History  . Occupation: CREW MEMBER    Employer: [employer]    Comment: landscaping  Tobacco Use  . Smoking status: Never Smoker  . Smokeless tobacco: Current User    Types: Snuff  . Tobacco comment: 1/2 can daily   Vaping Use  . Vaping Use: Never used  Substance and Sexual Activity  . Alcohol use: Yes    Comment: occasional  . Drug use: No  . Sexual activity: Yes    Birth control/protection: None  Other Topics Concern  . Not on file  Social History  Narrative   Lives with wife [name]) July 30th 8 years    Son-Marties 17 step son   All other child grown   Grandchild-1 baby girl       Enjoys: sleep      Diet: fried foods (too much salt)   Caffeine: 12 pk daily    Water: Does not like water      Wears seat belt   Smoke and carbon monoxide detectors      Does not use phone while driving    Social Determinants of Health   Financial Resource Strain: Not on file  Food Insecurity: Not on file  Transportation Needs: Not on file  Physical Activity: Not on file  Stress: Not on file  Social Connections: Not on file  Intimate Partner Violence: Not on file    Outpatient Medications Prior to Visit  Medication Sig Dispense Refill  . amLODipine (NORVASC) 10 MG tablet Take 1 tablet (10 mg total) by mouth daily. 90 tablet 1  . benazepril (LOTENSIN) 40 MG tablet Take 1 tablet (40 mg total) by mouth daily. 30 tablet 3  . hydrochlorothiazide (HYDRODIURIL) 25 MG tablet Take 1 tablet (25 mg total) by mouth daily. 30 tablet 1  .  hydrOXYzine (ATARAX/VISTARIL) 10 MG tablet Take 1 tablet (10 mg total) by mouth at bedtime. 30 tablet 0  . pantoprazole (PROTONIX) 20 MG tablet Take 1 tablet (20 mg total) by mouth daily. 30 tablet 5  . tadalafil (CIALIS) 10 MG tablet Take 1 tablet (10 mg total) by mouth daily as needed for erectile dysfunction. 30 tablet 1   No facility-administered medications prior to visit.    Allergies  Allergen Reactions  . Tramadol Other (See Comments)    Patient states "cramps"    Review of Systems  Constitutional: Negative.   Respiratory: Negative.   Cardiovascular: Negative.   Musculoskeletal: Negative.   Neurological: Negative.        Objective:    Physical Exam Constitutional:      Appearance: Normal appearance.  Cardiovascular:     Rate and Rhythm: Normal rate and regular rhythm.     Pulses: Normal pulses.     Heart sounds: Normal heart sounds.  Pulmonary:     Effort: Pulmonary effort is normal.      Breath sounds: Normal breath sounds.  Musculoskeletal:        General: Normal range of motion.  Neurological:     Mental Status: He is alert.  Psychiatric:        Mood and Affect: Mood normal.        Behavior: Behavior normal.        Thought Content: Thought content normal.        Judgment: Judgment normal.     BP (!) 152/91   Pulse 71   Temp 99.2 F (37.3 C)   Resp 18   Ht 6' (1.829 m)   Wt 198 lb (89.8 kg)   SpO2 98%   BMI 26.85 kg/m  Wt Readings from Last 3 Encounters:  03/03/20 198 lb (89.8 kg)  03/02/20 205 lb (93 kg)  08/01/19 201 lb 12.8 oz (91.5 kg)    Health Maintenance Due  Topic Date Due  . Hepatitis C Screening  Never done  . HIV Screening  Never done  . TETANUS/TDAP  Never done  . INFLUENZA VACCINE  10/20/2019    There are no preventive care reminders to display for this patient.   Lab Results  Component Value Date   TSH 2.102 11/21/2013   Lab Results  Component Value Date   WBC 5.0 03/02/2020   HGB 15.4 03/02/2020   HCT 43.7 03/02/2020   MCV 91.6 03/02/2020   PLT 214 03/02/2020   Lab Results  Component Value Date   NA 134 (L) 03/02/2020   K 3.3 (L) 03/02/2020   CO2 24 03/02/2020   GLUCOSE 123 (H) 03/02/2020   BUN 13 03/02/2020   CREATININE 0.95 03/02/2020   BILITOT 0.4 03/02/2020   ALKPHOS 54 03/02/2020   AST 25 03/02/2020   ALT 24 03/02/2020   PROT 7.6 03/02/2020   ALBUMIN 4.0 03/02/2020   CALCIUM 8.5 (L) 03/02/2020   ANIONGAP 7 03/02/2020   Lab Results  Component Value Date   CHOL 204 (H) 09/20/2018   Lab Results  Component Value Date   HDL 58 09/20/2018   Lab Results  Component Value Date   LDLCALC 127 (H) 09/20/2018   Lab Results  Component Value Date   TRIG 86 09/20/2018   Lab Results  Component Value Date   CHOLHDL 3.5 09/20/2018   Lab Results  Component Value Date   HGBA1C 5.1 09/20/2018       Assessment & Plan:   Problem List  Items Addressed This Visit      Cardiovascular and Mediastinum    Essential hypertension    -BP elevated today, but much improved since his ED visit yesterday -refilled benazepril        Digestive   GERD without esophagitis    -no issues today -refilled pantoprazole          No orders of the defined types were placed in this encounter.    Noreene Larsson, NP

## 2020-03-03 NOTE — Assessment & Plan Note (Signed)
-  no issues today -refilled pantoprazole

## 2020-03-05 ENCOUNTER — Ambulatory Visit: Payer: BC Managed Care – PPO | Attending: Internal Medicine

## 2020-03-05 ENCOUNTER — Ambulatory Visit: Payer: Self-pay

## 2020-03-05 DIAGNOSIS — Z23 Encounter for immunization: Secondary | ICD-10-CM

## 2020-03-05 NOTE — Progress Notes (Signed)
   Covid-19 Vaccination [facility]  Name:  Corey Thomas    MRN: [id number] DOB: 10/10/1965  03/05/2020  [name] was observed post Covid-19 immunization for 15 minutes without incident. He was provided with Vaccine Information Sheet and instruction to access the V-Safe system.   [name] was instructed to call 911 with any severe reactions post vaccine: Marland Kitchen Difficulty breathing  . Swelling of face and throat  . A fast heartbeat  . A bad rash all over body  . Dizziness and weakness   Immunizations Administered    Name Date Dose VIS Date Route   Pfizer COVID-19 Vaccine 03/05/2020  3:50 PM 0.3 mL 01/08/2020 Intramuscular   Manufacturer: [place]   Lot: [id number]   NDC: [address]-0

## 2020-03-27 ENCOUNTER — Other Ambulatory Visit: Payer: BC Managed Care – PPO

## 2020-03-27 ENCOUNTER — Other Ambulatory Visit: Payer: Self-pay

## 2020-03-27 DIAGNOSIS — Z20822 Contact with and (suspected) exposure to covid-19: Secondary | ICD-10-CM | POA: Diagnosis not present

## 2020-03-31 ENCOUNTER — Telehealth: Payer: Self-pay

## 2020-03-31 LAB — NOVEL CORONAVIRUS, NAA: SARS-CoV-2, NAA: DETECTED — AB

## 2020-03-31 NOTE — Telephone Encounter (Signed)
Patients wife calling she states he was tested for covid on Friday 1/7 and they haven't heard from anyone she would like the results p# [phone]

## 2020-03-31 NOTE — Telephone Encounter (Signed)
Pt tested positive wife notified with verbal understanding

## 2020-04-07 ENCOUNTER — Encounter: Payer: BC Managed Care – PPO | Admitting: Family Medicine

## 2020-04-09 ENCOUNTER — Other Ambulatory Visit: Payer: Self-pay | Admitting: Family Medicine

## 2020-04-09 DIAGNOSIS — N529 Male erectile dysfunction, unspecified: Secondary | ICD-10-CM

## 2020-04-22 ENCOUNTER — Encounter: Payer: Self-pay | Admitting: Internal Medicine

## 2020-04-22 ENCOUNTER — Other Ambulatory Visit: Payer: Self-pay

## 2020-04-22 ENCOUNTER — Ambulatory Visit (INDEPENDENT_AMBULATORY_CARE_PROVIDER_SITE_OTHER): Payer: BC Managed Care – PPO | Admitting: Internal Medicine

## 2020-04-22 VITALS — BP 164/92 | HR 67 | Temp 98.7°F | Resp 18 | Ht 72.0 in | Wt 196.4 lb

## 2020-04-22 DIAGNOSIS — L299 Pruritus, unspecified: Secondary | ICD-10-CM

## 2020-04-22 DIAGNOSIS — I10 Essential (primary) hypertension: Secondary | ICD-10-CM

## 2020-04-22 DIAGNOSIS — J302 Other seasonal allergic rhinitis: Secondary | ICD-10-CM | POA: Diagnosis not present

## 2020-04-22 MED ORDER — HYDROXYZINE HCL 25 MG PO TABS: 25.0000 mg | ORAL_TABLET | Freq: Two times a day (BID) | ORAL | 2 refills | Status: DC | PRN

## 2020-04-22 MED ORDER — HYDROCHLOROTHIAZIDE 25 MG PO TABS: 25.0000 mg | ORAL_TABLET | Freq: Every day | ORAL | 2 refills | Status: DC

## 2020-04-22 MED ORDER — CALAMINE EX LOTN: 1.0000 "application " | TOPICAL_LOTION | CUTANEOUS | 0 refills | Status: DC | PRN

## 2020-04-22 NOTE — Progress Notes (Signed)
Established Patient Office Visit  Subjective:  Patient ID: Corey Thomas, male    DOB: January 05, 1966  Age: 55 y.o. MRN: [id number]  CC:  Chief Complaint  Patient presents with  . Acute Visit    Dry itchy skin this is all over and its causing broken skin has been going on for 3 months     HPI Corey Thomas is a 55 year old male with PMH of uncontrolled HTN who presents for evaluation of generalized itching.  He c/o generalized intense itching over his arms, legs and abdomen. It is more intense at nighttime and after shower. Denies any new chemical exposure or new linen at home. He works for [employer]. Denies exposure to new plants or trees. Denies any chest pain, dyspnea or wheezing.  His BP was elevated today. He has been taking Amlodipine and Benazepril, but has not been taking HCTZ as he had misplaced it. Denies any headache or dizziness.  Past Medical History:  Diagnosis Date  . Arthritis   . Diverticulosis 12/15/2016  . GERD without esophagitis 06/08/2016  . HLD (hyperlipidemia) 07/15/2016  . Hypertension   . Tubular adenoma of colon 11/14/2016  . Vitamin D deficiency     Past Surgical History:  Procedure Laterality Date  . COLONOSCOPY N/A 11/11/2016   Procedure: COLONOSCOPY;  Surgeon: Rogene Houston, MD;  Location: [facility];  Service: Endoscopy;  Laterality: N/A;  12:55-rescheduled to 8/24 @ 10:25am per Lelon Frohlich  . NO PAST SURGERIES      Family History  Problem Relation Age of Onset  . Healthy Sister   . Healthy Sister   . Alcohol abuse Brother 41       cirrhosis  . Diabetes Other   . Osteoarthritis Other   . Arthritis Maternal Grandmother     Social History   Socioeconomic History  . Marital status: Married    Spouse name: Not on file  . Number of children: 2  . Years of education: 53  . Highest education level: Not on file  Occupational History  . Occupation: CREW MEMBER    Employer: [employer]    Comment:  landscaping  Tobacco Use  . Smoking status: Never Smoker  . Smokeless tobacco: Current User    Types: Snuff  . Tobacco comment: 1/2 can daily   Vaping Use  . Vaping Use: Never used  Substance and Sexual Activity  . Alcohol use: Yes    Comment: occasional  . Drug use: No  . Sexual activity: Yes    Birth control/protection: None  Other Topics Concern  . Not on file  Social History Narrative   Lives with wife [name]) July 30th 8 years    Son-Marties 17 step son   All other child grown   Grandchild-1 baby girl       Enjoys: sleep      Diet: fried foods (too much salt)   Caffeine: 12 pk daily    Water: Does not like water      Wears seat belt   Smoke and carbon monoxide detectors      Does not use phone while driving    Social Determinants of Health   Financial Resource Strain: Not on file  Food Insecurity: Not on file  Transportation Needs: Not on file  Physical Activity: Not on file  Stress: Not on file  Social Connections: Not on file  Intimate Partner Violence: Not on file    Outpatient Medications Prior to  Visit  Medication Sig Dispense Refill  . amLODipine (NORVASC) 10 MG tablet Take 1 tablet (10 mg total) by mouth daily. 90 tablet 1  . benazepril (LOTENSIN) 40 MG tablet Take 1 tablet (40 mg total) by mouth daily. 30 tablet 3  . esomeprazole (NEXIUM) 20 MG capsule Take 1 capsule (20 mg total) by mouth daily at 12 noon. 90 capsule 3  . tadalafil (CIALIS) 10 MG tablet TAKE 1 TABLET BY MOUTH ONCE DAILY AS NEEDED FOR  ED 23 tablet 0  . amLODipine (NORVASC) 5 MG tablet TAKE 1 TABLET BY MOUTH ONCE A DAY. (Patient not taking: Reported on 04/22/2020) 30 tablet 0  . hydrochlorothiazide (HYDRODIURIL) 25 MG tablet Take 1 tablet (25 mg total) by mouth daily. (Patient not taking: Reported on 04/22/2020) 30 tablet 1  . hydrOXYzine (ATARAX/VISTARIL) 10 MG tablet Take 1 tablet (10 mg total) by mouth at bedtime. (Patient not taking: Reported on 04/22/2020) 30 tablet 0   No  facility-administered medications prior to visit.    Allergies  Allergen Reactions  . Tramadol Other (See Comments)    Patient states "cramps"    ROS Review of Systems  Constitutional: Negative for chills and fever.  HENT: Negative for congestion and sore throat.   Eyes: Negative for pain and discharge.  Respiratory: Negative for cough and shortness of breath.   Cardiovascular: Negative for chest pain and palpitations.  Gastrointestinal: Negative for constipation, diarrhea, nausea and vomiting.  Endocrine: Negative for polydipsia and polyuria.  Genitourinary: Negative for dysuria and hematuria.  Musculoskeletal: Negative for neck pain and neck stiffness.  Skin: Negative for rash.       Generalized itching  Neurological: Negative for dizziness, weakness, numbness and headaches.  Psychiatric/Behavioral: Negative for agitation and behavioral problems.      Objective:    Physical Exam Vitals reviewed.  Constitutional:      General: He is not in acute distress.    Appearance: He is not diaphoretic.  HENT:     Head: Normocephalic and atraumatic.     Nose: Nose normal.     Mouth/Throat:     Mouth: Mucous membranes are moist.  Eyes:     General: No scleral icterus.    Extraocular Movements: Extraocular movements intact.     Pupils: Pupils are equal, round, and reactive to light.  Cardiovascular:     Rate and Rhythm: Normal rate and regular rhythm.     Heart sounds: No murmur heard.   Pulmonary:     Breath sounds: Normal breath sounds. No wheezing or rales.  Musculoskeletal:     Cervical back: Neck supple. No tenderness.     Right lower leg: No edema.     Left lower leg: No edema.  Skin:    General: Skin is warm.     Comments: Abrasions from itching over the legs  Neurological:     General: No focal deficit present.     Mental Status: He is alert and oriented to person, place, and time.  Psychiatric:        Mood and Affect: Mood normal.        Behavior: Behavior  normal.      BP (!) 164/92 (BP Location: Right Arm, Patient Position: Sitting, Cuff Size: Normal)   Pulse 67   Temp 98.7 F (37.1 C) (Oral)   Resp 18   Ht 6' (1.829 m)   Wt 196 lb 6.4 oz (89.1 kg)   SpO2 98%   BMI 26.64 kg/m  Wt Readings  from Last 3 Encounters:  04/22/20 196 lb 6.4 oz (89.1 kg)  03/03/20 198 lb (89.8 kg)  03/02/20 205 lb (93 kg)     Health Maintenance Due  Topic Date Due  . Hepatitis C Screening  Never done  . HIV Screening  Never done  . TETANUS/TDAP  Never done  . COVID-19 Vaccine (2 - Pfizer 3-dose series) 03/26/2020    There are no preventive care reminders to display for this patient.  Lab Results  Component Value Date   TSH 2.102 11/21/2013   Lab Results  Component Value Date   WBC 5.0 03/02/2020   HGB 15.4 03/02/2020   HCT 43.7 03/02/2020   MCV 91.6 03/02/2020   PLT 214 03/02/2020   Lab Results  Component Value Date   NA 134 (L) 03/02/2020   K 3.3 (L) 03/02/2020   CO2 24 03/02/2020   GLUCOSE 123 (H) 03/02/2020   BUN 13 03/02/2020   CREATININE 0.95 03/02/2020   BILITOT 0.4 03/02/2020   ALKPHOS 54 03/02/2020   AST 25 03/02/2020   ALT 24 03/02/2020   PROT 7.6 03/02/2020   ALBUMIN 4.0 03/02/2020   CALCIUM 8.5 (L) 03/02/2020   ANIONGAP 7 03/02/2020   Lab Results  Component Value Date   CHOL 204 (H) 09/20/2018   Lab Results  Component Value Date   HDL 58 09/20/2018   Lab Results  Component Value Date   LDLCALC 127 (H) 09/20/2018   Lab Results  Component Value Date   TRIG 86 09/20/2018   Lab Results  Component Value Date   CHOLHDL 3.5 09/20/2018   Lab Results  Component Value Date   HGBA1C 5.1 09/20/2018      Assessment & Plan:   Problem List Items Addressed This Visit      Cardiovascular and Mediastinum   Essential hypertension    BP Readings from Last 1 Encounters:  04/22/20 (!) 164/92   Uncontrolled with Amlodipine and Benazepril Was on HCTZ, has not been taking - refills sent Counseled for compliance  with the medications Advised DASH diet and moderate exercise/walking, at least 150 mins/week       Relevant Medications   hydrochlorothiazide (HYDRODIURIL) 25 MG tablet    Other Visit Diagnoses    Itching    -  Primary Appears to be allergic reaction Trial of Atarax If persistent, will prescribe short course of steroid   Relevant Medications   hydrOXYzine (ATARAX/VISTARIL) 25 MG tablet   calamine lotion   Seasonal allergies       Relevant Medications   hydrOXYzine (ATARAX/VISTARIL) 25 MG tablet      Meds ordered this encounter  Medications  . hydrOXYzine (ATARAX/VISTARIL) 25 MG tablet    Sig: Take 1 tablet (25 mg total) by mouth 2 (two) times daily as needed.    Dispense:  30 tablet    Refill:  2  . calamine lotion    Sig: Apply 1 application topically as needed for itching.    Dispense:  120 mL    Refill:  0  . hydrochlorothiazide (HYDRODIURIL) 25 MG tablet    Sig: Take 1 tablet (25 mg total) by mouth daily.    Dispense:  30 tablet    Refill:  2    Follow-up: Return in about 3 months (around 07/20/2020).    Lindell Spar, MD

## 2020-04-22 NOTE — Patient Instructions (Signed)
Please take Atarax for itching as prescribed. Please avoid taking it before work as it may cause drowsiness.  Please apply lotion as prescribed for itching over arms and legs.  Please use non-fragranced products to avoid to avoid allergic reaction.  Please start taking HCTZ in addition to Amlodipine and Benazepril for hypertension.  Please follow DASH diet and perform moderate exercise/walking at least 150 mins/week.

## 2020-04-22 NOTE — Assessment & Plan Note (Signed)
BP Readings from Last 1 Encounters:  04/22/20 (!) 164/92   Uncontrolled with Amlodipine and Benazepril Was on HCTZ, has not been taking - refills sent Counseled for compliance with the medications Advised DASH diet and moderate exercise/walking, at least 150 mins/week

## 2020-05-07 ENCOUNTER — Encounter: Payer: BC Managed Care – PPO | Admitting: Nurse Practitioner

## 2020-05-13 ENCOUNTER — Encounter: Payer: Self-pay | Admitting: Internal Medicine

## 2020-05-14 ENCOUNTER — Ambulatory Visit (INDEPENDENT_AMBULATORY_CARE_PROVIDER_SITE_OTHER): Payer: BC Managed Care – PPO | Admitting: Nurse Practitioner

## 2020-05-14 ENCOUNTER — Other Ambulatory Visit: Payer: Self-pay

## 2020-05-14 ENCOUNTER — Encounter: Payer: Self-pay | Admitting: Nurse Practitioner

## 2020-05-14 VITALS — BP 135/86 | HR 72 | Temp 97.1°F | Resp 20 | Ht 72.0 in | Wt 202.0 lb

## 2020-05-14 DIAGNOSIS — Z139 Encounter for screening, unspecified: Secondary | ICD-10-CM | POA: Diagnosis not present

## 2020-05-14 DIAGNOSIS — I10 Essential (primary) hypertension: Secondary | ICD-10-CM

## 2020-05-14 NOTE — Progress Notes (Signed)
Acute Office Visit  Subjective:    Patient ID: Corey Thomas, male    DOB: 1966/02/28, 55 y.o.   MRN: [id number]  Chief Complaint  Patient presents with  . Hypertension    Follow-up     HPI Patient is in today for BP check.  At the last OV on 03/03/20, his BP was 152/91 and we refilled his benazepril. Denies adverse medication effects.  Past Medical History:  Diagnosis Date  . Arthritis   . Diverticulosis 12/15/2016  . GERD without esophagitis 06/08/2016  . HLD (hyperlipidemia) 07/15/2016  . Hypertension   . Tubular adenoma of colon 11/14/2016  . Vitamin D deficiency     Past Surgical History:  Procedure Laterality Date  . COLONOSCOPY N/A 11/11/2016   Procedure: COLONOSCOPY;  Surgeon: Rogene Houston, MD;  Location: [facility];  Service: Endoscopy;  Laterality: N/A;  12:55-rescheduled to 8/24 @ 10:25am per Lelon Frohlich  . NO PAST SURGERIES      Family History  Problem Relation Age of Onset  . Healthy Sister   . Healthy Sister   . Alcohol abuse Brother 41       cirrhosis  . Diabetes Other   . Osteoarthritis Other   . Arthritis Maternal Grandmother     Social History   Socioeconomic History  . Marital status: Married    Spouse name: Not on file  . Number of children: 2  . Years of education: 69  . Highest education level: Not on file  Occupational History  . 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 Smoking status: Never Smoker  . Smokeless tobacco: Current User    Types: Snuff  . Tobacco comment: 1/2 can daily   Vaping Use  . Vaping Use: Never used  Substance and Sexual Activity  . Alcohol use: Yes    Comment: occasional  . Drug use: No  . Sexual activity: Yes    Birth control/protection: None  Other Topics Concern  . Not on file  Social History Narrative   ** Merged History Encounter **       Lives with wife [name]) July 30th 8 years  Son-Marties 17 step son All other child  grown Grandchild-1 baby girl   Enjoys: sleep  Diet: fried foods (too much salt) Caffeine: 12 pk daily  Water: Does not like water  Wears seat belt Smoke and car   bon monoxide detectors  Does not use phone while driving    Social Determinants of Health   Financial Resource Strain: Not on file  Food Insecurity: Not on file  Transportation Needs: Not on file  Physical Activity: Not on file  Stress: Not on file  Social Connections: Not on file  Intimate Partner Violence: Not on file    Outpatient Medications Prior to Visit  Medication Sig Dispense Refill  . amLODipine (NORVASC) 10 MG tablet Take 1 tablet (10 mg total) by mouth daily. 90 tablet 1  . benazepril (LOTENSIN) 40 MG tablet Take 1 tablet (40 mg total) by mouth daily. 30 tablet 3  . calamine lotion Apply 1 application topically as needed for itching. 120 mL 0  . esomeprazole (NEXIUM) 20 MG capsule Take 1 capsule (20 mg total) by mouth daily at 12 noon. 90 capsule 3  . hydrochlorothiazide (HYDRODIURIL) 25 MG tablet Take 1 tablet (25 mg total) by mouth daily. 30 tablet 2  . hydrOXYzine (ATARAX/VISTARIL) 25  MG tablet Take 1 tablet (25 mg total) by mouth 2 (two) times daily as needed. 30 tablet 2  . tadalafil (CIALIS) 10 MG tablet TAKE 1 TABLET BY MOUTH ONCE DAILY AS NEEDED FOR  ED 23 tablet 0   No facility-administered medications prior to visit.    Allergies  Allergen Reactions  . Tramadol Other (See Comments)    Patient states "cramps"    Review of Systems  Constitutional: Negative.   Respiratory: Negative.   Cardiovascular: Negative.   Musculoskeletal: Negative.   Psychiatric/Behavioral: Negative.        Objective:    Physical Exam Constitutional:      Appearance: Normal appearance.  Cardiovascular:     Rate and Rhythm: Normal rate and regular rhythm.     Pulses: Normal pulses.     Heart sounds: Normal heart sounds.  Pulmonary:     Effort: Pulmonary effort is normal.     Breath sounds: Normal  breath sounds.  Musculoskeletal:        General: Normal range of motion.  Neurological:     Mental Status: He is alert.  Psychiatric:        Mood and Affect: Mood normal.        Behavior: Behavior normal.        Thought Content: Thought content normal.        Judgment: Judgment normal.     BP 135/86   Pulse 72   Temp (!) 97.1 F (36.2 C)   Resp 20   Ht 6' (1.829 m)   Wt 202 lb (91.6 kg)   SpO2 98%   BMI 27.40 kg/m  Wt Readings from Last 3 Encounters:  05/14/20 202 lb (91.6 kg)  04/22/20 196 lb 6.4 oz (89.1 kg)  03/03/20 198 lb (89.8 kg)    Health Maintenance Due  Topic Date Due  . Hepatitis C Screening  Never done  . HIV Screening  Never done  . TETANUS/TDAP  Never done  . COVID-19 Vaccine (2 - Pfizer 3-dose series) 03/26/2020    There are no preventive care reminders to display for this patient.   Lab Results  Component Value Date   TSH 2.102 11/21/2013   Lab Results  Component Value Date   WBC 5.0 03/02/2020   HGB 15.4 03/02/2020   HCT 43.7 03/02/2020   MCV 91.6 03/02/2020   PLT 214 03/02/2020   Lab Results  Component Value Date   NA 134 (L) 03/02/2020   K 3.3 (L) 03/02/2020   CO2 24 03/02/2020   GLUCOSE 123 (H) 03/02/2020   BUN 13 03/02/2020   CREATININE 0.95 03/02/2020   BILITOT 0.4 03/02/2020   ALKPHOS 54 03/02/2020   AST 25 03/02/2020   ALT 24 03/02/2020   PROT 7.6 03/02/2020   ALBUMIN 4.0 03/02/2020   CALCIUM 8.5 (L) 03/02/2020   ANIONGAP 7 03/02/2020   Lab Results  Component Value Date   CHOL 204 (H) 09/20/2018   Lab Results  Component Value Date   HDL 58 09/20/2018   Lab Results  Component Value Date   LDLCALC 127 (H) 09/20/2018   Lab Results  Component Value Date   TRIG 86 09/20/2018   Lab Results  Component Value Date   CHOLHDL 3.5 09/20/2018   Lab Results  Component Value Date   HGBA1C 5.1 09/20/2018       Assessment & Plan:   Problem List Items Addressed This Visit      Cardiovascular and Mediastinum    Essential  hypertension    -BP is great today -no change in meds -discussed taking meds with food to avoid GI upset      Relevant Orders   CBC with Differential/Platelet   CMP14+EGFR   Lipid Panel With LDL/HDL Ratio     Other   Screening due - Primary   Relevant Orders   HCV Ab w/Rflx to Verification       No orders of the defined types were placed in this encounter.    Noreene Larsson, NP

## 2020-05-14 NOTE — Assessment & Plan Note (Signed)
-  BP is great today -no change in meds -discussed taking meds with food to avoid GI upset

## 2020-05-14 NOTE — Patient Instructions (Signed)

## 2020-05-29 ENCOUNTER — Other Ambulatory Visit: Payer: Self-pay | Admitting: Family Medicine

## 2020-07-22 ENCOUNTER — Other Ambulatory Visit: Payer: Self-pay | Admitting: Internal Medicine

## 2020-07-22 ENCOUNTER — Other Ambulatory Visit: Payer: Self-pay | Admitting: Family Medicine

## 2020-07-22 DIAGNOSIS — I10 Essential (primary) hypertension: Secondary | ICD-10-CM

## 2020-08-12 ENCOUNTER — Other Ambulatory Visit: Payer: Self-pay | Admitting: Family Medicine

## 2020-08-12 DIAGNOSIS — I10 Essential (primary) hypertension: Secondary | ICD-10-CM

## 2020-08-13 ENCOUNTER — Encounter: Payer: BC Managed Care – PPO | Admitting: Nurse Practitioner

## 2020-11-19 ENCOUNTER — Telehealth: Payer: Self-pay

## 2020-11-19 ENCOUNTER — Other Ambulatory Visit: Payer: Self-pay

## 2020-11-19 DIAGNOSIS — J302 Other seasonal allergic rhinitis: Secondary | ICD-10-CM

## 2020-11-19 DIAGNOSIS — I10 Essential (primary) hypertension: Secondary | ICD-10-CM

## 2020-11-19 DIAGNOSIS — L299 Pruritus, unspecified: Secondary | ICD-10-CM

## 2020-11-19 DIAGNOSIS — N529 Male erectile dysfunction, unspecified: Secondary | ICD-10-CM

## 2020-11-19 MED ORDER — ESOMEPRAZOLE MAGNESIUM 20 MG PO CPDR: 20.0000 mg | DELAYED_RELEASE_CAPSULE | Freq: Every day | ORAL | 3 refills | Status: DC

## 2020-11-19 MED ORDER — TADALAFIL 10 MG PO TABS: ORAL_TABLET | ORAL | 0 refills | Status: DC

## 2020-11-19 MED ORDER — AMLODIPINE BESYLATE 10 MG PO TABS: 10.0000 mg | ORAL_TABLET | Freq: Every day | ORAL | 0 refills | Status: DC

## 2020-11-19 MED ORDER — HYDROCHLOROTHIAZIDE 25 MG PO TABS: 25.0000 mg | ORAL_TABLET | Freq: Every day | ORAL | 0 refills | Status: DC

## 2020-11-19 MED ORDER — HYDROXYZINE HCL 25 MG PO TABS: 25.0000 mg | ORAL_TABLET | Freq: Two times a day (BID) | ORAL | 2 refills | Status: DC | PRN

## 2020-11-19 MED ORDER — BENAZEPRIL HCL 40 MG PO TABS: 40.0000 mg | ORAL_TABLET | Freq: Every day | ORAL | 3 refills | Status: DC

## 2020-11-19 NOTE — Telephone Encounter (Signed)
Patient called need med refills  amLODipine (NORVASC) 10 MG tablet  hydrochlorothiazide (HYDRODIURIL) 25 MG tablet    hydrOXYzine (ATARAX/VISTARIL) 25 MG tablet  tadalafil (CIALIS) 10 MG tablet  benazepril (LOTENSIN) 40 MG tablet  esomeprazole (NEXIUM) 20 MG capsule  Pharmacy: [facility]

## 2020-11-19 NOTE — Telephone Encounter (Signed)
Rx sent 

## 2021-03-16 ENCOUNTER — Other Ambulatory Visit: Payer: Self-pay

## 2021-03-16 DIAGNOSIS — N529 Male erectile dysfunction, unspecified: Secondary | ICD-10-CM

## 2021-03-16 MED ORDER — TADALAFIL 10 MG PO TABS: ORAL_TABLET | ORAL | 0 refills | Status: DC

## 2021-05-14 ENCOUNTER — Other Ambulatory Visit: Payer: Self-pay

## 2021-05-14 ENCOUNTER — Encounter: Payer: Self-pay | Admitting: Nurse Practitioner

## 2021-05-14 ENCOUNTER — Ambulatory Visit: Payer: BC Managed Care – PPO | Admitting: Nurse Practitioner

## 2021-05-14 VITALS — BP 151/91 | HR 70 | Ht 72.0 in | Wt 199.0 lb

## 2021-05-14 DIAGNOSIS — R519 Headache, unspecified: Secondary | ICD-10-CM

## 2021-05-14 DIAGNOSIS — Z139 Encounter for screening, unspecified: Secondary | ICD-10-CM

## 2021-05-14 DIAGNOSIS — E785 Hyperlipidemia, unspecified: Secondary | ICD-10-CM | POA: Diagnosis not present

## 2021-05-14 DIAGNOSIS — I10 Essential (primary) hypertension: Secondary | ICD-10-CM

## 2021-05-14 MED ORDER — HYDROCHLOROTHIAZIDE 25 MG PO TABS: 25.0000 mg | ORAL_TABLET | Freq: Every day | ORAL | 0 refills | Status: AC

## 2021-05-14 MED ORDER — AMLODIPINE BESYLATE 10 MG PO TABS: 10.0000 mg | ORAL_TABLET | Freq: Every day | ORAL | 0 refills | Status: AC

## 2021-05-14 MED ORDER — BENAZEPRIL HCL 40 MG PO TABS: 40.0000 mg | ORAL_TABLET | Freq: Every day | ORAL | 0 refills | Status: AC

## 2021-05-14 NOTE — Progress Notes (Signed)
° °  Corey Thomas     MRN: [id number]      DOB: 07/26/1965   HPI Corey Thomas medical history of hypertension, hyperlipidemia, erectile dysfunction, noncompliance with medication, GERD is here for for his chronic medical conditions   Pt c/o Headache that started yesterday, states that  he was pruning some roses at work when the HA started, one day ago, HA is throbbing 8/10, he took some alleve but it did not help. Denies nausea , vomiting, changes in his vision. He has had HA before but not like this.  Patient states that he has been out of some of his blood pressure medications.  Patient not really sure which of the blood pressure medication that he is taking.    ROS Denies recent fever or chills. Denies sinus pressure, nasal congestion, ear pain or sore throat. Denies chest congestion, productive cough or wheezing. Denies chest pains, palpitations and leg swelling Denies abdominal pain, nausea, vomiting,diarrhea or constipation.   Denies dysuria, frequency, hesitancy or incontinence. Denies joint pain, swelling and limitation in mobility. Has  headaches, denies seizures, numbness, or tingling. Denies depression, anxiety or insomnia.    PE  BP (!) 151/91    Pulse 70    Ht 6' (1.829 m)    Wt 199 lb 0.6 oz (90.3 kg)    SpO2 97%    BMI 26.99 kg/m   Patient alert and oriented and in no cardiopulmonary distress.  Chest: Clear to auscultation bilaterally.  CVS: S1, S2 no murmurs, no S3.Regular rate.  ABD: Soft non tender.   MS: Adequate ROM spine, shoulders, hips and knees.  Psych: Good eye contact, normal affect. Memory intact not anxious or depressed appearing.  CNS: CN 2-12 intact, power,  normal throughout.no focal deficits noted.   Assessment & Plan

## 2021-05-14 NOTE — Assessment & Plan Note (Signed)
Has headache that started 1 day ago. Denies nausea vomiting changes in his vision Headache most likely due to elevated blood pressure. Blood pressure medications refilled today Patient told to take Tylenol 650 mg every 6 hours as needed for his headache. Get adequate rest.

## 2021-05-14 NOTE — Assessment & Plan Note (Signed)
BP Readings from Last 3 Encounters:  05/14/21 (!) 151/91  05/14/20 135/86  04/22/20 (!) 164/92  Patient stated that he ran out of his blood pressure medications,  Amlodipine 10 mg, hydrochlorothiazide thiazide 25 mg, Lotensin 40 mg refilled today. Patient educated on the need to take medications as prescribed and to notify the office whenever he needs refill, patient verbalized understanding. Continue amlodipine 10 mg daily, hydrochlorothiazide 25 mg daily, Lotensin 40 mg daily. Follow-up in 4 weeks DASH-Diet advised, need for regular exercise 30 minutes 5 times a week discussed with patient.  CMP+GFR today.

## 2021-05-14 NOTE — Assessment & Plan Note (Signed)
Lab Results  Component Value Date   CHOL 204 (H) 09/20/2018   HDL 58 09/20/2018   LDLCALC 127 (H) 09/20/2018   TRIG 86 09/20/2018   CHOLHDL 3.5 09/20/2018  Check lipid panel before next visit Not on cholesterol lowering agents.  No history of CAD or diabetes

## 2021-05-14 NOTE — Patient Instructions (Signed)
Please get your lab work done 3-5 days before your next visit.     It is important that you exercise regularly at least 30 minutes 5 times a week.  Think about what you will eat, plan ahead. Choose " clean, green, fresh or frozen" over canned, processed or packaged foods which are more sugary, salty and fatty. 70 to 75% of food eaten should be vegetables and fruit. Three meals at set times with snacks allowed between meals, but they must be fruit or vegetables. Aim to eat over a 12 hour period , example 7 am to 7 pm, and STOP after  your last meal of the day. Drink water,generally about 64 ounces per day, no other drink is as healthy. Fruit juice is best enjoyed in a healthy way, by EATING the fruit.  Thanks for choosing [facility], we consider it a privelige to serve you.

## 2021-05-15 LAB — CMP14+EGFR
ALT: 27 IU/L (ref 0–44)
AST: 24 IU/L (ref 0–40)
Albumin/Globulin Ratio: 1.6 (ref 1.2–2.2)
Albumin: 4.6 g/dL (ref 3.8–4.9)
Alkaline Phosphatase: 75 IU/L (ref 44–121)
BUN/Creatinine Ratio: 12 (ref 9–20)
BUN: 13 mg/dL (ref 6–24)
Bilirubin Total: 0.3 mg/dL (ref 0.0–1.2)
CO2: 22 mmol/L (ref 20–29)
Calcium: 9.4 mg/dL (ref 8.7–10.2)
Chloride: 103 mmol/L (ref 96–106)
Creatinine, Ser: 1.11 mg/dL (ref 0.76–1.27)
Globulin, Total: 2.8 g/dL (ref 1.5–4.5)
Glucose: 94 mg/dL (ref 70–99)
Potassium: 3.9 mmol/L (ref 3.5–5.2)
Sodium: 140 mmol/L (ref 134–144)
Total Protein: 7.4 g/dL (ref 6.0–8.5)
eGFR: 79 mL/min/{1.73_m2} (ref >59)

## 2021-05-15 NOTE — Progress Notes (Signed)
Normal kidney function, liver function, normal electrolytes. Patient should continue to take his medications as prescribed and keep up with his follow-up appointment as discussed thank you

## 2021-07-15 ENCOUNTER — Encounter: Payer: BC Managed Care – PPO | Admitting: Nurse Practitioner

## 2021-07-28 ENCOUNTER — Ambulatory Visit: Payer: BC Managed Care – PPO | Admitting: Nurse Practitioner

## 2021-07-28 ENCOUNTER — Encounter: Payer: Self-pay | Admitting: Nurse Practitioner

## 2021-07-28 VITALS — BP 145/90 | HR 65 | Ht 72.0 in | Wt 199.0 lb

## 2021-07-28 DIAGNOSIS — Z1211 Encounter for screening for malignant neoplasm of colon: Secondary | ICD-10-CM | POA: Diagnosis not present

## 2021-07-28 DIAGNOSIS — I10 Essential (primary) hypertension: Secondary | ICD-10-CM

## 2021-07-28 DIAGNOSIS — K921 Melena: Secondary | ICD-10-CM | POA: Diagnosis not present

## 2021-07-28 MED ORDER — HYDRALAZINE HCL 25 MG PO TABS: 25.0000 mg | ORAL_TABLET | Freq: Three times a day (TID) | ORAL | 2 refills | Status: DC

## 2021-07-28 NOTE — Progress Notes (Signed)
? ?  Corey Thomas     MRN: [id number]      DOB: 08-14-1965 ? ? ?HPI ?Corey Thomas with past medical history of hypertension, GERD, erectile dysfunction, primary insomnia is here for c/o blood in the stool since the past 3 days, last colonoscopy was 5 years ago , he was told that he will need colonoscopy every 5 years. States that after wipping he sees blood on the tisuse with drips of blood in the comode. Denies constipation but he states that he strains before he can move his bowel. Denies diarrhea, abdominal pain , fever chills, malaise, loss of appetite.    ? ?Blood pressure remains uncontrolled patient stated that he has been taking hydrochlorothiazide '25mg'$  daily, benazepril '40mg'$  daily, amlodipine '10mg'$  daily as prescribed patient denies chest pain, dizziness, edema, syncope.  ? ? ? ?ROS ?Denies recent fever or chills. ?Denies sinus pressure, nasal congestion, ear pain or sore throat. ?Denies chest congestion, productive cough or wheezing. ?Denies chest pains, palpitations and leg swelling ?Denies abdominal pain, nausea, vomiting,diarrhea or constipation.   ?Denies dysuria, frequency, hesitancy or incontinence. ?Denies joint pain, swelling and limitation in mobility. ?Denies headaches, seizures, numbness, or tingling. ?Denies depression, anxiety or insomnia. ? ? ? ?PE ? ?BP (!) 150/100 (BP Location: Left Arm, Cuff Size: Large)   Pulse 65   Ht 6' (1.829 m)   Wt 199 lb (90.3 kg)   SpO2 99%   BMI 26.99 kg/m?  ? ?Patient alert and oriented and in no cardiopulmonary distress. ? ?Chest: Clear to auscultation bilaterally. ? ?CVS: S1, S2 no murmurs, no S3.Regular rate. ? ?ABD: Soft non tender in all 4 quadrants, no masses palpated, no CVA tenderness ? ?Ext: No edema ? ?MS: Adequate ROM spine, shoulders, hips and knees. ? ? ?Psych: Good eye contact, normal affect. Memory intact not anxious or depressed appearing. ? ? ? ?Assessment & Plan ? ?Essential hypertension ?BP Readings from Last 3 Encounters:  ?07/28/21 (!)  145/90  ?05/14/21 (!) 151/91  ?05/14/20 135/86  ? ?Chronic condition uncontrolled on hydrochlorothiazide '25mg'$  daily, benazepril '40mg'$  daily, amlodipine '10mg'$  daily ?Start hydralazine 25 mg 3 times daily, continue current medications ?DASH diet advised, engage in daily physical exercises at least 150 minutes weekly ?Monitor blood pressure daily at home keep a log and bring to follow-up visit in 4 weeks. ? ?Blood in stool ?Symptoms started since the past 3 days ?Last colonoscopy was in 2018, had Large cecal polyp, surface high-grade dysplasia.Marland Kitchen ?recommendation was for colonoscopy every 3 years, ?Hemoccult card given to patient today with instructions to bring card back to the office with 3 different stool samples ?Urgent referral sent to GI for colonoscopy since patient is past due for screening colonoscopy. ? ?Reports straining with bowel movement ,patient told to drink at least 64 ounces of water daily, eats plenty of fibers to prevent constipation.  ?

## 2021-07-28 NOTE — Assessment & Plan Note (Signed)
Symptoms started since the past 3 days ?Last colonoscopy was in 2018, had Large cecal polyp, surface high-grade dysplasia.Marland Kitchen ?recommendation was for colonoscopy every 3 years, ?Hemoccult card given to patient today with instructions to bring card back to the office with 3 different stool samples ?Urgent referral sent to GI for colonoscopy since patient is past due for screening colonoscopy. ? ?Reports straining with bowel movement ,patient told to drink at least 64 ounces of water daily, eats plenty of fibers to prevent constipation. ?

## 2021-07-28 NOTE — Assessment & Plan Note (Signed)
BP Readings from Last 3 Encounters:  ?07/28/21 (!) 145/90  ?05/14/21 (!) 151/91  ?05/14/20 135/86  ? ?Chronic condition uncontrolled on hydrochlorothiazide '25mg'$  daily, benazepril '40mg'$  daily, amlodipine '10mg'$  daily ?Start hydralazine 25 mg 3 times daily, continue current medications ?DASH diet advised, engage in daily physical exercises at least 150 minutes weekly ?Monitor blood pressure daily at home keep a log and bring to follow-up visit in 4 weeks. ?

## 2021-07-28 NOTE — Patient Instructions (Addendum)
Take Hydralazine 25 mg three times daily, continue hydrochlorothiazide '25mg'$  daily, benazepril '40mg'$  daily, amlodipine '10mg'$  daily. Please check your blood pressure daily , keep a log and bring to your follow up in 4 weeks.  ? ? ? ?Pleas bring back to the office the hemoccult cards give to you today with three different stool samples.  ? ?It is important that you exercise regularly at least 30 minutes 5 times a week.  ?Think about what you will eat, plan ahead. ?Choose " clean, green, fresh or frozen" over canned, processed or packaged foods which are more sugary, salty and fatty. ?70 to 75% of food eaten should be vegetables and fruit. ?Three meals at set times with snacks allowed between meals, but they must be fruit or vegetables. ?Aim to eat over a 12 hour period , example 7 am to 7 pm, and STOP after  your last meal of the day. ?Drink water,generally about 64 ounces per day, no other drink is as healthy. Fruit juice is best enjoyed in a healthy way, by EATING the fruit. ? ?Thanks for choosing [facility], we consider it a privelige to serve you.  ? ? ?

## 2021-08-03 ENCOUNTER — Encounter (INDEPENDENT_AMBULATORY_CARE_PROVIDER_SITE_OTHER): Payer: Self-pay | Admitting: *Deleted

## 2021-08-25 ENCOUNTER — Ambulatory Visit: Payer: BC Managed Care – PPO | Admitting: Nurse Practitioner

## 2021-09-30 ENCOUNTER — Ambulatory Visit (INDEPENDENT_AMBULATORY_CARE_PROVIDER_SITE_OTHER): Payer: BC Managed Care – PPO | Admitting: Gastroenterology

## 2021-10-05 ENCOUNTER — Encounter: Payer: BC Managed Care – PPO | Admitting: Nurse Practitioner

## 2021-10-06 ENCOUNTER — Encounter: Payer: Self-pay | Admitting: Nurse Practitioner

## 2021-11-29 ENCOUNTER — Ambulatory Visit (INDEPENDENT_AMBULATORY_CARE_PROVIDER_SITE_OTHER): Payer: BC Managed Care – PPO | Admitting: Gastroenterology

## 2021-12-30 ENCOUNTER — Ambulatory Visit: Payer: BC Managed Care – PPO | Admitting: Internal Medicine

## 2022-01-24 ENCOUNTER — Encounter (INDEPENDENT_AMBULATORY_CARE_PROVIDER_SITE_OTHER): Payer: Self-pay | Admitting: *Deleted

## 2022-01-24 ENCOUNTER — Encounter (INDEPENDENT_AMBULATORY_CARE_PROVIDER_SITE_OTHER): Payer: Self-pay | Admitting: Gastroenterology

## 2022-01-24 ENCOUNTER — Ambulatory Visit (INDEPENDENT_AMBULATORY_CARE_PROVIDER_SITE_OTHER): Payer: BC Managed Care – PPO | Admitting: Gastroenterology

## 2022-01-24 VITALS — BP 187/122 | HR 72 | Temp 98.5°F | Ht 72.0 in | Wt 197.0 lb

## 2022-01-24 DIAGNOSIS — Z7141 Alcohol abuse counseling and surveillance of alcoholic: Secondary | ICD-10-CM | POA: Diagnosis not present

## 2022-01-24 DIAGNOSIS — D126 Benign neoplasm of colon, unspecified: Secondary | ICD-10-CM

## 2022-01-24 DIAGNOSIS — K625 Hemorrhage of anus and rectum: Secondary | ICD-10-CM

## 2022-01-24 MED ORDER — PEG 3350-KCL-NA BICARB-NACL 420 G PO SOLR: 4000.0000 mL | Freq: Once | ORAL | 0 refills | Status: AC

## 2022-01-24 NOTE — Progress Notes (Addendum)
Referring Provider: Renee Rival, FNP Primary Care Physician:  No primary care provider on file. Primary GI Physician: new   Chief Complaint  Patient presents with   Blood In Stools    New patient. Referred for blood in stool. Sometimes has mid abdominal pain off and on.    HPI:   Corey Thomas is a 56 y.o. male with past medical history of arthritis, diverticulosis, HLD, HTN, tubular adenoma of colon, vit d deficiency.   Patient presenting today as a new patient for rectal bleeding   No recent CBCs in chart review, last CMP in feb WNL.  Patient reports he had one episode of blood mixed in with his stool, this was a few months back. Has not noted any further blood in stools. No constipation or diarrhea. No melena. He has no changes in appetite or weight loss. States he was supposed to have a repeat colonoscopy 3 years after the one in 2018 but noone every called him to schedule it. He has no other complaints today   NSAID use: occasionally for acute pain.  Social hx: 6-12 beers per day, sometimes liquor on the weekends. Chews tobacco, no smoking. Fam hx: no crc or pancreatic cancer  Last Colonoscopy: 2018, - One 20 mm polyp in the cecum- high grade dysplasia - One 12 mm polyp in the cecum, removed with a hot snare. polypectomy site treated with APC to ablate residual polyp at margin. Resected and retrieved. - One 6 mm polyp at the hepatic flexure - One 8 mm polyp in the transverse colon, Complete resection. Polyp tissue not retrieved. - One 4 mm polyp in the rectum. Treated with a monopolar probe. - Diverticulosis in the ascending colon. - Internal hemorrhoids. Last Endoscopy: never  Recommendations:  Repeat colonoscopy due in 2021  Past Medical History:  Diagnosis Date   Arthritis    Diverticulosis 12/15/2016   GERD without esophagitis 06/08/2016   HLD (hyperlipidemia) 07/15/2016   Hypertension    Tubular adenoma of colon 11/14/2016   Vitamin D deficiency      Past Surgical History:  Procedure Laterality Date   COLONOSCOPY N/A 11/11/2016   Procedure: COLONOSCOPY;  Surgeon: Rogene Houston, MD;  Location: [facility];  Service: Endoscopy;  Laterality: N/A;  12:55-rescheduled to 8/24 @ 10:25am per Lelon Frohlich   NO PAST SURGERIES      Current Outpatient Medications  Medication Sig Dispense Refill   amLODipine (NORVASC) 10 MG tablet Take 1 tablet (10 mg total) by mouth daily. 90 tablet 0   benazepril (LOTENSIN) 40 MG tablet Take 1 tablet (40 mg total) by mouth daily. 90 tablet 0   calamine lotion Apply 1 application topically as needed for itching. 120 mL 0   hydrochlorothiazide (HYDRODIURIL) 25 MG tablet Take 1 tablet (25 mg total) by mouth daily. 90 tablet 0   hydrOXYzine (ATARAX/VISTARIL) 25 MG tablet Take 1 tablet (25 mg total) by mouth 2 (two) times daily as needed. 30 tablet 2   tadalafil (CIALIS) 10 MG tablet TAKE 1 TABLET BY MOUTH ONCE DAILY AS NEEDED FOR  ED 23 tablet 0   esomeprazole (NEXIUM) 20 MG capsule Take 1 capsule (20 mg total) by mouth daily at 12 noon. (Patient not taking: Reported on 01/24/2022) 90 capsule 3   hydrALAZINE (APRESOLINE) 25 MG tablet Take 1 tablet (25 mg total) by mouth 3 (three) times daily. (Patient not taking: Reported on 01/24/2022) 90 tablet 2   No current facility-administered medications for this visit.  Allergies as of 01/24/2022 - Review Complete 01/24/2022  Allergen Reaction Noted   Tramadol Other (See Comments) 02/21/2017    Family History  Problem Relation Age of Onset   Healthy Sister    Healthy Sister    Alcohol abuse Brother 37       cirrhosis   Diabetes Other    Osteoarthritis Other    Arthritis Maternal Grandmother     Social History   Socioeconomic History   Marital status: Married    Spouse name: Not on file   Number of children: 2   Years of education: 11   Highest education level: Not on file  Occupational History   Occupation: CREW MEMBER    Employer: [employer]    Comment:  [employer]    Comment: landscaping  Tobacco Use   Smoking status: Never    Passive exposure: Past   Smokeless tobacco: Current    Types: Snuff   Tobacco comments:    1/2 can daily   Vaping Use   Vaping Use: Never used  Substance and Sexual Activity   Alcohol use: Yes    Comment: occasional   Drug use: No   Sexual activity: Yes    Birth control/protection: None  Other Topics Concern   Not on file  Social History Narrative   ** Merged History Encounter **       Lives with wife [name]) July 30th 8 years  Son-Marties 17 step son All other child grown Grandchild-1 baby girl   Enjoys: sleep  Diet: fried foods (too much salt) Caffeine: 12 pk daily  Water: Does not like water  Wears seat belt Smoke and car   bon monoxide detectors  Does not use phone while driving    Social Determinants of Radio broadcast assistant Strain: Not on file  Food Insecurity: Not on file  Transportation Needs: Not on file  Physical Activity: Not on file  Stress: Not on file  Social Connections: Not on file   Review of systems General: negative for malaise, night sweats, fever, chills, weight loss Neck: Negative for lumps, goiter, pain and significant neck swelling Resp: Negative for cough, wheezing, dyspnea at rest CV: Negative for chest pain, leg swelling, palpitations, orthopnea GI: denies melena, hematochezia, nausea, vomiting, diarrhea, constipation, dysphagia, odyonophagia, early satiety or unintentional weight loss.  MSK: Negative for joint pain or swelling, back pain, and muscle pain. Derm: Negative for itching or rash Psych: Denies depression, anxiety, memory loss, confusion. No homicidal or suicidal ideation.  Heme: Negative for prolonged bleeding, bruising easily, and swollen nodes. Endocrine: Negative for cold or heat intolerance, polyuria, polydipsia and goiter. Neuro: negative for tremor, gait imbalance, syncope and seizures. The remainder of the review of systems is  noncontributory.  Physical Exam: BP (!) 187/122 (BP Location: Right Arm, Patient Position: Sitting, Cuff Size: Large) Comment: has not taken bp meds today. reports no symptoms  Pulse 72   Temp 98.5 F (36.9 C) (Oral)   Ht 6' (1.829 m)   Wt 197 lb (89.4 kg)   BMI 26.72 kg/m  General:   Alert and oriented. No distress noted. Pleasant and cooperative.  Head:  Normocephalic and atraumatic. Eyes:  Conjuctiva clear without scleral icterus. Mouth:  Oral mucosa pink and moist. Good dentition. No lesions. Heart: Normal rate and rhythm, s1 and s2 heart sounds present.  Lungs: Clear lung sounds in all lobes. Respirations equal and unlabored. Abdomen:  +BS, soft, non-tender and non-distended. No rebound or guarding. No HSM or  masses noted. Derm: No palmar erythema or jaundice Msk:  Symmetrical without gross deformities. Normal posture. Extremities:  Without edema. Neurologic:  Alert and  oriented x4 Psych:  Alert and cooperative. Normal mood and affect.  Invalid input(s): "6 MONTHS"   ASSESSMENT: AKEEM HEPPLER is a 56 y.o. male presenting today for rectal bleeding and to schedule updated colonoscopy due to history of tubular adenomas.  Patient with history of tubular adenoma, one with high grade dysplasia in 2018, recommended repeat colonoscopy in 3 years, however, he was lost to follow up for this. He had an episode of rectal bleeding noting blood mixed in with his stool a few months back. Denies any further episodes. No red flag symptoms. Patient denies melena, hematochezia, nausea, vomiting, diarrhea, constipation, dysphagia, odyonophagia, early satiety or weight loss. Recommend proceeding with colonoscopy due to history and for further evaluation of rectal bleeding. Indications, risks and benefits of procedure discussed in detail with patient. Patient verbalized understanding and is in agreement to proceed with colonoscopy at this time.   Notably, patient drinks daily, 6-12 beers per day  and sometimes liquor. He was counseled on potential adverse effects of ongoing, frequent ETOH abuse to include fatty liver, cirrhosis and other detriments to multiple other organ systems. Recommended significant decrease in etoh intake.    PLAN:  Colonoscopy ASA III/ENDO 3-(etoh abuse) 2.  Decrease etoh intake   All questions were answered, patient verbalized understanding and is in agreement with plan as outlined above.    Follow Up: TBD  Sharin Altidor L. Alver Sorrow, MSN, APRN, AGNP-C Adult-Gerontology Nurse Practitioner [facility]  I have reviewed the note and agree with the APP's assessment as described in this progress note  Maylon Peppers, MD Gastroenterology and Hepatology [facility] Gastroenterology

## 2022-01-24 NOTE — Patient Instructions (Signed)
We will get you scheduled for colonoscopy As discussed, please consider decreasing alcohol intake due to concern for liver damage over time.

## 2022-01-25 ENCOUNTER — Encounter (INDEPENDENT_AMBULATORY_CARE_PROVIDER_SITE_OTHER): Payer: Self-pay | Admitting: *Deleted

## 2022-02-01 ENCOUNTER — Encounter (INDEPENDENT_AMBULATORY_CARE_PROVIDER_SITE_OTHER): Payer: Self-pay | Admitting: Gastroenterology

## 2022-02-01 ENCOUNTER — Ambulatory Visit: Payer: BC Managed Care – PPO | Admitting: Orthopaedic Surgery

## 2022-02-08 ENCOUNTER — Ambulatory Visit: Payer: BC Managed Care – PPO | Admitting: Orthopaedic Surgery

## 2022-02-21 ENCOUNTER — Telehealth (INDEPENDENT_AMBULATORY_CARE_PROVIDER_SITE_OTHER): Payer: Self-pay | Admitting: *Deleted

## 2022-02-21 NOTE — Patient Instructions (Signed)
Corey Thomas  02/21/2022     '@PREFPERIOPPHARMACY'$ @   Your procedure is scheduled on  02/24/2022.   Report to [facility] at  1230 P.M.   Call this number if you have problems the morning of surgery:  [phone]  If you experience any cold or flu symptoms such as cough, fever, chills, shortness of breath, etc. between now and your scheduled surgery, please notify us at the above number.   Remember:  Follow the diet and prep instructions given to you by the office.     Take these medicines the morning of surgery with A SIP OF WATER                              amlodipine, pantoprazole.     Do not wear jewelry, make-up or nail polish.  Do not wear lotions, powders, or perfumes, or deodorant.  Do not shave 48 hours prior to surgery.  Men may shave face and neck.  Do not bring valuables to the hospital.  [facility] is not responsible for any belongings or valuables.  Contacts, dentures or bridgework may not be worn into surgery.  Leave your suitcase in the car.  After surgery it may be brought to your room.  For patients admitted to the hospital, discharge time will be determined by your treatment team.  Patients discharged the day of surgery will not be allowed to drive home and must have someone with them for 24 hours.    Special instructions:   DO NOT smoke tobacco or vape for 24 hours before your procedure.  Please read over the following fact sheets that you were given. Anesthesia Post-op Instructions and Care and Recovery After Surgery      Colonoscopy, Adult, Care After The following information offers guidance on how to care for yourself after your procedure. Your health care provider may also give you more specific instructions. If you have problems or questions, contact your health care provider. What can I expect after the procedure? After the procedure, it is common to have: A small amount of blood in your stool for 24 hours after the  procedure. Some gas. Mild cramping or bloating of your abdomen. Follow these instructions at home: Eating and drinking  Drink enough fluid to keep your urine pale yellow. Follow instructions from your health care provider about eating or drinking restrictions. Resume your normal diet as told by your health care provider. Avoid heavy or fried foods that are hard to digest. Activity Rest as told by your health care provider. Avoid sitting for a long time without moving. Get up to take short walks every 1-2 hours. This is important to improve blood flow and breathing. Ask for help if you feel weak or unsteady. Return to your normal activities as told by your health care provider. Ask your health care provider what activities are safe for you. Managing cramping and bloating  Try walking around when you have cramps or feel bloated. If directed, apply heat to your abdomen as told by your health care provider. Use the heat source that your health care provider recommends, such as a moist heat pack or a heating pad. Place a towel between your skin and the heat source. Leave the heat on for 20-30 minutes. Remove the heat if your skin turns bright red. This is especially important if you are unable to feel pain, heat, or cold. You  have a greater risk of getting burned. General instructions If you were given a sedative during the procedure, it can affect you for several hours. Do not drive or operate machinery until your health care provider says that it is safe. For the first 24 hours after the procedure: Do not sign important documents. Do not drink alcohol. Do your regular daily activities at a slower pace than normal. Eat soft foods that are easy to digest. Take over-the-counter and prescription medicines only as told by your health care provider. Keep all follow-up visits. This is important. Contact a health care provider if: You have blood in your stool 2-3 days after the procedure. Get  help right away if: You have more than a small spotting of blood in your stool. You have large blood clots in your stool. You have swelling of your abdomen. You have nausea or vomiting. You have a fever. You have increasing pain in your abdomen that is not relieved with medicine. These symptoms may be an emergency. Get help right away. Call 911. Do not wait to see if the symptoms will go away. Do not drive yourself to the hospital. Summary After the procedure, it is common to have a small amount of blood in your stool. You may also have mild cramping and bloating of your abdomen. If you were given a sedative during the procedure, it can affect you for several hours. Do not drive or operate machinery until your health care provider says that it is safe. Get help right away if you have a lot of blood in your stool, nausea or vomiting, a fever, or increased pain in your abdomen. This information is not intended to replace advice given to you by your health care provider. Make sure you discuss any questions you have with your health care provider. Document Revised: 10/28/2020 Document Reviewed: 10/28/2020 Elsevier Patient Education  [place] After The following information offers guidance on how to care for yourself after your procedure. Your health care provider may also give you more specific instructions. If you have problems or questions, contact your health care provider. What can I expect after the procedure? After the procedure, it is common to have: Tiredness. Little or no memory about what happened during or after the procedure. Impaired judgment when it comes to making decisions. Nausea or vomiting. Some trouble with balance. Follow these instructions at home: For the time period you were told by your health care provider:  Rest. Do not participate in activities where you could fall or become injured. Do not drive or use machinery. Do  not drink alcohol. Do not take sleeping pills or medicines that cause drowsiness. Do not make important decisions or sign legal documents. Do not take care of children on your own. Medicines Take over-the-counter and prescription medicines only as told by your health care provider. If you were prescribed antibiotics, take them as told by your health care provider. Do not stop using the antibiotic even if you start to feel better. Eating and drinking Follow instructions from your health care provider about what you may eat and drink. Drink enough fluid to keep your urine pale yellow. If you vomit: Drink clear fluids slowly and in small amounts as you are able. Clear fluids include water, ice chips, low-calorie sports drinks, and fruit juice that has water added to it (diluted fruit juice). Eat light and bland foods in small amounts as you are able. These foods include bananas, applesauce, rice,  lean meats, toast, and crackers. General instructions  Have a responsible adult stay with you for the time you are told. It is important to have someone help care for you until you are awake and alert. If you have sleep apnea, surgery and some medicines can increase your risk for breathing problems. Follow instructions from your health care provider about wearing your sleep device: When you are sleeping. This includes during daytime naps. While taking prescription pain medicines, sleeping medicines, or medicines that make you drowsy. Do not use any products that contain nicotine or tobacco. These products include cigarettes, chewing tobacco, and vaping devices, such as e-cigarettes. If you need help quitting, ask your health care provider. Contact a health care provider if: You feel nauseous or vomit every time you eat or drink. You feel light-headed. You are still sleepy or having trouble with balance after 24 hours. You get a rash. You have a fever. You have redness or swelling around the IV  site. Get help right away if: You have trouble breathing. You have new confusion after you get home. These symptoms may be an emergency. Get help right away. Call 911. Do not wait to see if the symptoms will go away. Do not drive yourself to the hospital. This information is not intended to replace advice given to you by your health care provider. Make sure you discuss any questions you have with your health care provider. Document Revised: 08/02/2021 Document Reviewed: 08/02/2021 Elsevier Patient Education  [place].

## 2022-02-21 NOTE — Telephone Encounter (Signed)
That is correct, FMLA only applies for certain chronic gastrointestinal conditions such as cancer or inflammatory bowel disease.  I do not see any of these in his medical chart.  If he needs a letter for the day he is off for his colonoscopy, Mitzie can give her a note for this.

## 2022-02-21 NOTE — Telephone Encounter (Signed)
Patient came in office and dropped off FMLA forms. I called and spoke with wife since forms are for her to be out with him. She told me he has a lot of stomach issues and she was out of work on 01/24/22 to bring him to doctor and has to be out with him for his colonoscopy on 12/7. I let her know that FMLA was approved for chronic illness and I did not see anything listed in his chart but I would send back to let doctor review.

## 2022-02-22 ENCOUNTER — Encounter (HOSPITAL_COMMUNITY)
Admission: RE | Admit: 2022-02-22 | Discharge: 2022-02-22 | Disposition: A | Discharge status: Home / Self Care | Payer: BC Managed Care – PPO | Source: Ambulatory Visit | Attending: Gastroenterology | Admitting: Gastroenterology

## 2022-02-22 VITALS — BP 151/100 | HR 71 | Temp 97.5°F | Resp 18 | Ht 72.0 in | Wt 197.1 lb

## 2022-02-22 DIAGNOSIS — Z1211 Encounter for screening for malignant neoplasm of colon: Secondary | ICD-10-CM | POA: Diagnosis not present

## 2022-02-22 DIAGNOSIS — K648 Other hemorrhoids: Secondary | ICD-10-CM | POA: Diagnosis not present

## 2022-02-22 DIAGNOSIS — Z01818 Encounter for other preprocedural examination: Secondary | ICD-10-CM | POA: Insufficient documentation

## 2022-02-22 DIAGNOSIS — D123 Benign neoplasm of transverse colon: Secondary | ICD-10-CM | POA: Diagnosis not present

## 2022-02-22 DIAGNOSIS — I10 Essential (primary) hypertension: Secondary | ICD-10-CM

## 2022-02-22 DIAGNOSIS — D124 Benign neoplasm of descending colon: Secondary | ICD-10-CM | POA: Diagnosis not present

## 2022-02-22 DIAGNOSIS — K625 Hemorrhage of anus and rectum: Secondary | ICD-10-CM | POA: Diagnosis not present

## 2022-02-22 DIAGNOSIS — K219 Gastro-esophageal reflux disease without esophagitis: Secondary | ICD-10-CM | POA: Diagnosis not present

## 2022-02-22 DIAGNOSIS — Z79899 Other long term (current) drug therapy: Secondary | ICD-10-CM

## 2022-02-22 DIAGNOSIS — K5730 Diverticulosis of large intestine without perforation or abscess without bleeding: Secondary | ICD-10-CM | POA: Diagnosis not present

## 2022-02-22 DIAGNOSIS — M1990 Unspecified osteoarthritis, unspecified site: Secondary | ICD-10-CM | POA: Diagnosis not present

## 2022-02-22 DIAGNOSIS — F1010 Alcohol abuse, uncomplicated: Secondary | ICD-10-CM | POA: Diagnosis not present

## 2022-02-22 DIAGNOSIS — E785 Hyperlipidemia, unspecified: Secondary | ICD-10-CM | POA: Diagnosis not present

## 2022-02-22 DIAGNOSIS — Z86010 Personal history of colonic polyps: Secondary | ICD-10-CM | POA: Diagnosis not present

## 2022-02-22 LAB — BASIC METABOLIC PANEL
Anion gap: 10 (ref 5–15)
BUN: 18 mg/dL (ref 6–20)
CO2: 22 mmol/L (ref 22–32)
Calcium: 8.8 mg/dL — ABNORMAL LOW (ref 8.9–10.3)
Chloride: 104 mmol/L (ref 98–111)
Creatinine, Ser: 1.28 mg/dL — ABNORMAL HIGH (ref 0.61–1.24)
GFR, Estimated: >60 mL/min (ref >60)
Glucose, Bld: 91 mg/dL (ref 70–99)
Potassium: 3.8 mmol/L (ref 3.5–5.1)
Sodium: 136 mmol/L (ref 135–145)

## 2022-02-22 NOTE — Telephone Encounter (Signed)
Discussed with patient and he verbalized understanding and he states he will pick up the papers he dropped off. He understands Dr. Loletha Grayer cannot fill out forms and can provide a note for the day of colonoscopy only.

## 2022-02-24 ENCOUNTER — Ambulatory Visit (HOSPITAL_COMMUNITY): Payer: BC Managed Care – PPO | Admitting: Anesthesiology

## 2022-02-24 ENCOUNTER — Encounter (HOSPITAL_COMMUNITY)
Admission: RE | Disposition: A | Discharge status: Home / Self Care | Payer: Self-pay | Source: Home / Self Care | Attending: Gastroenterology

## 2022-02-24 ENCOUNTER — Ambulatory Visit (HOSPITAL_COMMUNITY)
Admission: RE | Admit: 2022-02-24 | Discharge: 2022-02-24 | Disposition: A | Discharge status: Home / Self Care | Payer: BC Managed Care – PPO | Attending: Gastroenterology | Admitting: Gastroenterology

## 2022-02-24 ENCOUNTER — Encounter (HOSPITAL_COMMUNITY): Payer: Self-pay | Admitting: Gastroenterology

## 2022-02-24 DIAGNOSIS — D124 Benign neoplasm of descending colon: Secondary | ICD-10-CM | POA: Insufficient documentation

## 2022-02-24 DIAGNOSIS — K648 Other hemorrhoids: Secondary | ICD-10-CM | POA: Insufficient documentation

## 2022-02-24 DIAGNOSIS — K219 Gastro-esophageal reflux disease without esophagitis: Secondary | ICD-10-CM | POA: Insufficient documentation

## 2022-02-24 DIAGNOSIS — Z1211 Encounter for screening for malignant neoplasm of colon: Secondary | ICD-10-CM | POA: Diagnosis not present

## 2022-02-24 DIAGNOSIS — Z86010 Personal history of colonic polyps: Secondary | ICD-10-CM | POA: Diagnosis not present

## 2022-02-24 DIAGNOSIS — K635 Polyp of colon: Secondary | ICD-10-CM

## 2022-02-24 DIAGNOSIS — K5730 Diverticulosis of large intestine without perforation or abscess without bleeding: Secondary | ICD-10-CM | POA: Insufficient documentation

## 2022-02-24 DIAGNOSIS — E785 Hyperlipidemia, unspecified: Secondary | ICD-10-CM | POA: Insufficient documentation

## 2022-02-24 DIAGNOSIS — F1010 Alcohol abuse, uncomplicated: Secondary | ICD-10-CM | POA: Insufficient documentation

## 2022-02-24 DIAGNOSIS — M1990 Unspecified osteoarthritis, unspecified site: Secondary | ICD-10-CM | POA: Diagnosis not present

## 2022-02-24 DIAGNOSIS — K625 Hemorrhage of anus and rectum: Secondary | ICD-10-CM | POA: Diagnosis not present

## 2022-02-24 DIAGNOSIS — I10 Essential (primary) hypertension: Secondary | ICD-10-CM | POA: Insufficient documentation

## 2022-02-24 DIAGNOSIS — D123 Benign neoplasm of transverse colon: Secondary | ICD-10-CM | POA: Diagnosis not present

## 2022-02-24 DIAGNOSIS — K6389 Other specified diseases of intestine: Secondary | ICD-10-CM | POA: Diagnosis not present

## 2022-02-24 DIAGNOSIS — D126 Benign neoplasm of colon, unspecified: Secondary | ICD-10-CM

## 2022-02-24 HISTORY — PX: COLONOSCOPY WITH PROPOFOL: SHX5780

## 2022-02-24 HISTORY — PX: POLYPECTOMY: SHX149

## 2022-02-24 SURGERY — COLONOSCOPY WITH PROPOFOL
Anesthesia: General

## 2022-02-24 MED ORDER — PROPOFOL 10 MG/ML IV BOLUS
INTRAVENOUS | Status: AC
  Filled 2022-02-24: qty 20

## 2022-02-24 MED ORDER — LACTATED RINGERS IV SOLN
INTRAVENOUS | Status: DC
  Administered 2022-02-24: 1000 mL via INTRAVENOUS

## 2022-02-24 MED ORDER — PROPOFOL 500 MG/50ML IV EMUL
INTRAVENOUS | Status: DC | PRN
  Administered 2022-02-24: 200 ug/kg/min via INTRAVENOUS

## 2022-02-24 MED ORDER — PHENYLEPHRINE 80 MCG/ML (10ML) SYRINGE FOR IV PUSH (FOR BLOOD PRESSURE SUPPORT)
PREFILLED_SYRINGE | INTRAVENOUS | Status: AC
  Filled 2022-02-24: qty 10

## 2022-02-24 MED ORDER — PROPOFOL 10 MG/ML IV BOLUS
INTRAVENOUS | Status: DC | PRN
  Administered 2022-02-24 (×3): 20 mg via INTRAVENOUS
  Administered 2022-02-24: 10 mg via INTRAVENOUS
  Administered 2022-02-24: 50 mg via INTRAVENOUS
  Administered 2022-02-24: 20 mg via INTRAVENOUS

## 2022-02-24 MED ORDER — LIDOCAINE HCL (CARDIAC) PF 100 MG/5ML IV SOSY
PREFILLED_SYRINGE | INTRAVENOUS | Status: DC | PRN
  Administered 2022-02-24: 100 mg via INTRAVENOUS

## 2022-02-24 MED ORDER — EPHEDRINE 5 MG/ML INJ
INTRAVENOUS | Status: AC
  Filled 2022-02-24: qty 5

## 2022-02-24 MED ORDER — LACTATED RINGERS IV SOLN: INTRAVENOUS | Status: DC | PRN

## 2022-02-24 MED ORDER — STERILE WATER FOR IRRIGATION IR SOLN: Status: DC | PRN

## 2022-02-24 NOTE — Op Note (Signed)
[facility] Patient Name: Corey Thomas Procedure Date: 02/24/2022 1:14 PM MRN: [id number] Date of Birth: Apr 24, 1965 Attending MD: Maylon Peppers , , [id number] CSN: [id number] Age: 56 Admit Type: Outpatient Procedure:                Colonoscopy Indications:              High risk colon cancer surveillance: Personal                            history of adenoma with high grade dysplasia Providers:                Maylon Peppers, Janeece Riggers, RN, Everardo Pacific Referring MD:             Maylon Peppers Medicines:                Monitored Anesthesia Care Complications:            No immediate complications. Estimated Blood Loss:     Estimated blood loss: none. Procedure:                Pre-Anesthesia Assessment:                           - Prior to the procedure, a History and Physical                            was performed, and patient medications, allergies                            and sensitivities were reviewed. The patient's                            tolerance of previous anesthesia was reviewed.                           - The risks and benefits of the procedure and the                            sedation options and risks were discussed with the                            patient. All questions were answered and informed                            consent was obtained.                           - ASA Grade Assessment: II - A patient with mild                            systemic disease.                           After obtaining informed consent, the colonoscope                            was passed  under direct vision. Throughout the                            procedure, the patient's blood pressure, pulse, and                            oxygen saturations were monitored continuously. The                            [id number]-[id number] ([id number]) scope was introduced through                            the anus and advanced to the the cecum, identified                             by appendiceal orifice and ileocecal valve. The                            colonoscopy was performed without difficulty. The                            patient tolerated the procedure well. The quality                            of the bowel preparation was good. Scope In: 1:23:41 PM Scope Out: 1:52:45 PM Scope Withdrawal Time: 0 hours 23 minutes 49 seconds  Total Procedure Duration: 0 hours 29 minutes 4 seconds  Findings:      The perianal and digital rectal examinations were normal.      A few large-mouthed diverticula were found in the ascending colon.      Two sessile polyps were found in the transverse colon and hepatic       flexure. The polyps were 4 to 8 mm in size. These polyps were removed       with a cold snare. Resection and retrieval were complete.      Two sessile polyps were found in the descending colon. The polyps were 4       to 6 mm in size. These polyps were removed with a cold snare. Resection       and retrieval were complete.      Non-bleeding internal hemorrhoids were found during retroflexion. The       hemorrhoids were small. Impression:               - Diverticulosis in the ascending colon.                           - Two 4 to 8 mm polyps in the transverse colon and                            at the hepatic flexure, removed with a cold snare.                            Resected and retrieved.                           -  Two 4 to 6 mm polyps in the descending colon,                            removed with a cold snare. Resected and retrieved.                           - Non-bleeding internal hemorrhoids. Moderate Sedation:      Per Anesthesia Care Recommendation:           - Discharge patient to home (ambulatory).                           - Resume previous diet.                           - Await pathology results.                           - Repeat colonoscopy in 3 years for surveillance. Procedure Code(s):        --- Professional ---                            [phone], Colonoscopy, flexible; with removal of                            tumor(s), polyp(s), or other lesion(s) by snare                            technique Diagnosis Code(s):        --- Professional ---                           Z86.010, Personal history of colonic polyps                           K64.8, Other hemorrhoids                           D12.3, Benign neoplasm of transverse colon (hepatic                            flexure or splenic flexure)                           D12.4, Benign neoplasm of descending colon                           K57.30, Diverticulosis of large intestine without                            perforation or abscess without bleeding CPT copyright 2022 American [employer] Association. All rights reserved. The codes documented in this report are preliminary and upon coder review may  be revised to meet current compliance requirements. Maylon Peppers, MD Maylon Peppers,  02/24/2022 2:00:27 PM This report has been signed electronically. Number of Addenda: 0

## 2022-02-24 NOTE — Discharge Instructions (Signed)
You are being discharged to home.  Resume your previous diet.  We are waiting for your pathology results.  Your physician has recommended a repeat colonoscopy in three years for surveillance.  

## 2022-02-24 NOTE — Transfer of Care (Signed)
Immediate Anesthesia Transfer of Care Note  Patient: Corey Thomas  Procedure(s) Performed: COLONOSCOPY WITH PROPOFOL POLYPECTOMY INTESTINAL  Patient Location: PACU  Anesthesia Type:General  Level of Consciousness: drowsy  Airway & Oxygen Therapy: Patient Spontanous Breathing and Patient connected to nasal cannula oxygen  Post-op Assessment: Report given to RN and Post -op Vital signs reviewed and stable  Post vital signs: Reviewed and stable  Last Vitals:  Vitals Value Taken Time  BP 115/46 02/24/22 1357  Temp 36.9 C 02/24/22 1357  Pulse 67 02/24/22 1357  Resp 12 02/24/22 1357  SpO2 98 % 02/24/22 1357    Last Pain:  Vitals:   02/24/22 1357  TempSrc: Oral  PainSc: 0-No pain      Patients Stated Pain Goal: 7 (48/47/20 7218)  Complications: No notable events documented.

## 2022-02-24 NOTE — H&P (Signed)
Corey Thomas is an 56 y.o. male.   Chief Complaint: history colon polyps, high grade dysplasia HPI: 56 year old male with past medical history of diverticulosis, GERD, alcohol abuse, hyperlipidemia, hypertension, arthritis, who comes for follow-up of history of colon polyps,  Patient reports feeling well and denies any complaints.  He was having rectal bleeding in the past multiple months ago but has not presented for the last 8 months.  Coming today for follow-up of his history of colon polyps, last colonoscopy in 2018 1 polyp had high-grade dysplasia.  Past Medical History:  Diagnosis Date   Arthritis    Diverticulosis 12/15/2016   GERD without esophagitis 06/08/2016   HLD (hyperlipidemia) 07/15/2016   Hypertension    Tubular adenoma of colon 11/14/2016   Vitamin D deficiency     Past Surgical History:  Procedure Laterality Date   COLONOSCOPY N/A 11/11/2016   Procedure: COLONOSCOPY;  Surgeon: Rogene Houston, MD;  Location: [facility];  Service: Endoscopy;  Laterality: N/A;  12:55-rescheduled to 8/24 @ 10:25am per Lelon Frohlich   NO PAST SURGERIES      Family History  Problem Relation Age of Onset   Healthy Sister    Healthy Sister    Alcohol abuse Brother 36       cirrhosis   Diabetes Other    Osteoarthritis Other    Arthritis Maternal Grandmother    Social History:  reports that he has never smoked. He has been exposed to tobacco smoke. His smokeless tobacco use includes snuff. He reports current alcohol use. He reports that he does not use drugs.  Allergies:  Allergies  Allergen Reactions   Tramadol Other (See Comments)    Patient states "cramps"    Medications Prior to Admission  Medication Sig Dispense Refill   amLODipine (NORVASC) 10 MG tablet Take 1 tablet (10 mg total) by mouth daily. 90 tablet 0   benazepril (LOTENSIN) 40 MG tablet Take 1 tablet (40 mg total) by mouth daily. 90 tablet 0   GAVILYTE-G 236 g solution Take 4,000 mLs by mouth as directed.      hydrochlorothiazide (HYDRODIURIL) 25 MG tablet Take 1 tablet (25 mg total) by mouth daily. 90 tablet 0   calamine lotion Apply 1 application topically as needed for itching. (Patient not taking: Reported on 02/21/2022) 120 mL 0   esomeprazole (NEXIUM) 20 MG capsule Take 1 capsule (20 mg total) by mouth daily at 12 noon. (Patient not taking: Reported on 01/24/2022) 90 capsule 3   hydrALAZINE (APRESOLINE) 25 MG tablet Take 1 tablet (25 mg total) by mouth 3 (three) times daily. (Patient not taking: Reported on 01/24/2022) 90 tablet 2   hydrOXYzine (ATARAX/VISTARIL) 25 MG tablet Take 1 tablet (25 mg total) by mouth 2 (two) times daily as needed. (Patient not taking: Reported on 02/21/2022) 30 tablet 2   pantoprazole (PROTONIX) 40 MG tablet Take 40 mg by mouth daily.     tadalafil (CIALIS) 10 MG tablet TAKE 1 TABLET BY MOUTH ONCE DAILY AS NEEDED FOR  ED 23 tablet 0    Results for orders placed or performed during the hospital encounter of 02/22/22 (from the past 48 hour(s))  Basic metabolic panel     Status: Abnormal   Collection Time: 02/22/22  2:38 PM  Result Value Ref Range   Sodium 136 135 - 145 mmol/L   Potassium 3.8 3.5 - 5.1 mmol/L   Chloride 104 98 - 111 mmol/L   CO2 22 22 - 32 mmol/L   Glucose, Bld  91 70 - 99 mg/dL    Comment: Glucose reference range applies only to samples taken after fasting for at least 8 hours.   BUN 18 6 - 20 mg/dL   Creatinine, Ser 1.28 (H) 0.61 - 1.24 mg/dL   Calcium 8.8 (L) 8.9 - 10.3 mg/dL   GFR, Estimated >60 >60 mL/min    Comment: (NOTE) Calculated using the CKD-EPI Creatinine Equation (2021)    Anion gap 10 5 - 15    Comment: Performed at [facility], [address] [place], [place] [address]   No results found.  Review of Systems  All other systems reviewed and are negative.   Blood pressure (!) 161/97, pulse (!) 57, temperature 98.7 F (37.1 C), temperature source Oral, resp. rate 16, height 6' (1.829 m), weight 89.4 kg, SpO2 100 %. Physical Exam   GENERAL: The patient is AO x3, in no acute distress. HEENT: Head is normocephalic and atraumatic. EOMI are intact. Mouth is well hydrated and without lesions. NECK: Supple. No masses LUNGS: Clear to auscultation. No presence of rhonchi/wheezing/rales. Adequate chest expansion HEART: RRR, normal s1 and s2. ABDOMEN: Soft, nontender, no guarding, no peritoneal signs, and nondistended. BS +. No masses. EXTREMITIES: Without any cyanosis, clubbing, rash, lesions or edema. NEUROLOGIC: AOx3, no focal motor deficit. SKIN: no jaundice, no rashes  Assessment/Plan 56 year old male with past medical history of diverticulosis, GERD, alcohol abuse, hyperlipidemia, hypertension, arthritis, who comes for follow-up of history of colon polyps, we will proceed with colonoscopy. Harvel Quale, MD 02/24/2022, 1:19 PM

## 2022-02-24 NOTE — Anesthesia Postprocedure Evaluation (Signed)
Anesthesia Post Note  Patient: Corey Thomas  Procedure(s) Performed: COLONOSCOPY WITH PROPOFOL POLYPECTOMY INTESTINAL  Patient location during evaluation: Phase II Anesthesia Type: General Level of consciousness: awake and alert and oriented Pain management: pain level controlled Vital Signs Assessment: post-procedure vital signs reviewed and stable Respiratory status: spontaneous breathing, nonlabored ventilation and respiratory function stable Cardiovascular status: blood pressure returned to baseline and stable Postop Assessment: no apparent nausea or vomiting Anesthetic complications: no  No notable events documented.   Last Vitals:  Vitals:   02/24/22 1230 02/24/22 1357  BP: (!) 161/97 (!) 115/46  Pulse: (!) 57 67  Resp: 16 12  Temp: 37.1 C 36.9 C  SpO2: 100% 98%    Last Pain:  Vitals:   02/24/22 1357  TempSrc: Oral  PainSc: 0-No pain                 Antonella Upson C Merikay Lesniewski

## 2022-02-24 NOTE — Anesthesia Preprocedure Evaluation (Signed)
Anesthesia Evaluation  Patient identified by MRN, date of birth, ID band Patient awake    Reviewed: Allergy & Precautions, H&P , NPO status , Patient's Chart, lab work & pertinent test results  History of Anesthesia Complications Negative for: history of anesthetic complications  Airway Mallampati: III  TM Distance: >3 FB Neck ROM: Full    Dental  (+) Dental Advisory Given, Teeth Intact   Pulmonary neg pulmonary ROS   Pulmonary exam normal breath sounds clear to auscultation       Cardiovascular Exercise Tolerance: Good hypertension, Pt. on medications Normal cardiovascular exam Rhythm:Regular Rate:Normal  22-Feb-2022 14:34:34 [facility] ROUTINE RECORD November 18, 1965 (82 yr) Male Black Vent. rate 65 BPM PR interval 140 ms QRS duration 96 ms QT/QTcB 388/403 ms P-R-T axes 64 87 6 Sinus rhythm with Premature atrial complexes Nonspecific ST and T wave abnormality Abnormal ECG When compared with ECG of 25-Sep-2018 15:50, No significant change since last tracing Left ventricular hypertrophy Confirmed by Asencion Noble [phone]) on 02/22/2022 8:03:10 PM   Neuro/Psych  Headaches  negative psych ROS   GI/Hepatic ,GERD  Controlled,,(+)     substance abuse (6 beers/day)  alcohol use  Endo/Other  negative endocrine ROS    Renal/GU negative Renal ROS  negative genitourinary   Musculoskeletal  (+) Arthritis , Osteoarthritis,    Abdominal   Peds negative pediatric ROS (+)  Hematology negative hematology ROS (+)   Anesthesia Other Findings   Reproductive/Obstetrics negative OB ROS                             Anesthesia Physical Anesthesia Plan  ASA: 2  Anesthesia Plan: General   Post-op Pain Management: Minimal or no pain anticipated   Induction: Intravenous  PONV Risk Score and Plan: 1 and Propofol infusion  Airway Management Planned: Nasal Cannula and Natural  Airway  Additional Equipment:   Intra-op Plan:   Post-operative Plan:   Informed Consent: I have reviewed the patients History and Physical, chart, labs and discussed the procedure including the risks, benefits and alternatives for the proposed anesthesia with the patient or authorized representative who has indicated his/her understanding and acceptance.     Dental advisory given  Plan Discussed with: CRNA and Surgeon  Anesthesia Plan Comments:         Anesthesia Quick Evaluation

## 2022-03-01 LAB — SURGICAL PATHOLOGY

## 2022-03-03 ENCOUNTER — Encounter (HOSPITAL_COMMUNITY): Payer: Self-pay | Admitting: Gastroenterology

## 2022-06-05 ENCOUNTER — Other Ambulatory Visit: Payer: Self-pay

## 2022-06-05 ENCOUNTER — Emergency Department (HOSPITAL_COMMUNITY)
Admission: EM | Admit: 2022-06-05 | Discharge: 2022-06-05 | Disposition: A | Discharge status: Home / Self Care | Payer: BC Managed Care – PPO | Attending: Emergency Medicine | Admitting: Emergency Medicine

## 2022-06-05 ENCOUNTER — Emergency Department (HOSPITAL_COMMUNITY): Payer: BC Managed Care – PPO

## 2022-06-05 DIAGNOSIS — Z79899 Other long term (current) drug therapy: Secondary | ICD-10-CM | POA: Insufficient documentation

## 2022-06-05 DIAGNOSIS — I10 Essential (primary) hypertension: Secondary | ICD-10-CM | POA: Diagnosis not present

## 2022-06-05 DIAGNOSIS — R519 Headache, unspecified: Secondary | ICD-10-CM | POA: Diagnosis not present

## 2022-06-05 DIAGNOSIS — F17290 Nicotine dependence, other tobacco product, uncomplicated: Secondary | ICD-10-CM | POA: Insufficient documentation

## 2022-06-05 LAB — CBC WITH DIFFERENTIAL/PLATELET
Abs Immature Granulocytes: 0.01 10*3/uL (ref 0.00–0.07)
Basophils Absolute: 0 10*3/uL (ref 0.0–0.1)
Basophils Relative: 1 %
Eosinophils Absolute: 0.2 10*3/uL (ref 0.0–0.5)
Eosinophils Relative: 5 %
HCT: 47.1 % (ref 39.0–52.0)
Hemoglobin: 16.8 g/dL (ref 13.0–17.0)
Immature Granulocytes: 0 %
Lymphocytes Relative: 32 %
Lymphs Abs: 1.5 10*3/uL (ref 0.7–4.0)
MCH: 31.8 pg (ref 26.0–34.0)
MCHC: 35.7 g/dL (ref 30.0–36.0)
MCV: 89.2 fL (ref 80.0–100.0)
Monocytes Absolute: 0.6 10*3/uL (ref 0.1–1.0)
Monocytes Relative: 12 %
Neutro Abs: 2.2 10*3/uL (ref 1.7–7.7)
Neutrophils Relative %: 50 %
Platelets: 217 10*3/uL (ref 150–400)
RBC: 5.28 MIL/uL (ref 4.22–5.81)
RDW: 12.5 % (ref 11.5–15.5)
WBC: 4.5 10*3/uL (ref 4.0–10.5)
nRBC: 0 % (ref 0.0–0.2)

## 2022-06-05 LAB — BASIC METABOLIC PANEL
Anion gap: 10 (ref 5–15)
BUN: 15 mg/dL (ref 6–20)
CO2: 27 mmol/L (ref 22–32)
Calcium: 9 mg/dL (ref 8.9–10.3)
Chloride: 98 mmol/L (ref 98–111)
Creatinine, Ser: 1.18 mg/dL (ref 0.61–1.24)
GFR, Estimated: >60 mL/min (ref >60)
Glucose, Bld: 101 mg/dL — ABNORMAL HIGH (ref 70–99)
Potassium: 3.7 mmol/L (ref 3.5–5.1)
Sodium: 135 mmol/L (ref 135–145)

## 2022-06-05 MED ORDER — HYDROMORPHONE HCL 1 MG/ML IJ SOLN
2.0000 mg | Freq: Once | INTRAMUSCULAR | Status: AC
  Administered 2022-06-05: 2 mg via INTRAMUSCULAR
  Filled 2022-06-05: qty 2

## 2022-06-05 NOTE — ED Notes (Signed)
Patient Alert and oriented to baseline. Stable and ambulatory to baseline. Patient verbalized understanding of the discharge instructions.  Patient belongings were taken by the patient.   

## 2022-06-05 NOTE — ED Triage Notes (Signed)
Pt here POV with reports of generalized headache onset several days ago. Pt denies any weakness, vision changes. Denies NVD or fevers. Denies sick contacts. Pt unsure if his BP is elevated causing his symptoms.

## 2022-06-05 NOTE — ED Provider Notes (Addendum)
[facility] Provider Note   CSN: [id number] Arrival date & time: 06/05/22  [id number]     History  Chief Complaint  Patient presents with   Headache    Corey Thomas is a 57 y.o. male.  Patient with known history of hypertension.  Patient did take all of his hypertensive meds this morning.  At 530.  Patient does not normally check his blood pressure at home.  Patient not always consistent taking his blood pressure medicine but has taken it for the past 2 days.  Used to be followed by Dr. Moshe Cipro.  Currently does not have a primary.  Patient with a complaint of a generalized headache for 2 days.  Patient's spouse states is really headaches been there longer.  Not associated with visual changes no weakness no numbness no neck stiffness no respiratory symptoms no abdominal pain no nausea or vomiting or diarrhea.  Patient does not have a history of migraines.  Past medical history significant for the hypertension hyperlipidemia.  Patient does not smoke but does use snuff.  Occasionally uses alcohol.       Home Medications Prior to Admission medications   Medication Sig Start Date End Date Taking? Authorizing Provider  amLODipine (NORVASC) 10 MG tablet Take 1 tablet (10 mg total) by mouth daily. 05/14/21   Paseda, Dewaine Conger, FNP  benazepril (LOTENSIN) 40 MG tablet Take 1 tablet (40 mg total) by mouth daily. 05/14/21   Renee Rival, FNP  calamine lotion Apply 1 application topically as needed for itching. Patient not taking: Reported on 02/21/2022 04/22/20   Lindell Spar, MD  hydrALAZINE (APRESOLINE) 25 MG tablet Take 1 tablet (25 mg total) by mouth 3 (three) times daily. Patient not taking: Reported on 01/24/2022 07/28/21   Renee Rival, FNP  hydrochlorothiazide (HYDRODIURIL) 25 MG tablet Take 1 tablet (25 mg total) by mouth daily. 05/14/21   Renee Rival, FNP  hydrOXYzine (ATARAX/VISTARIL) 25 MG tablet Take 1 tablet (25 mg  total) by mouth 2 (two) times daily as needed. Patient not taking: Reported on 02/21/2022 11/19/20   Noreene Larsson, NP  pantoprazole (PROTONIX) 40 MG tablet Take 40 mg by mouth daily.    [provider]  tadalafil (CIALIS) 10 MG tablet TAKE 1 TABLET BY MOUTH ONCE DAILY AS NEEDED FOR  ED 03/16/21   Noreene Larsson, NP      Allergies    Tramadol    Review of Systems   Review of Systems  Constitutional:  Negative for chills and fever.  HENT:  Negative for rhinorrhea and sore throat.   Eyes:  Negative for photophobia and visual disturbance.  Respiratory:  Negative for cough and shortness of breath.   Cardiovascular:  Negative for chest pain and leg swelling.  Gastrointestinal:  Negative for abdominal pain, diarrhea, nausea and vomiting.  Genitourinary:  Negative for dysuria.  Musculoskeletal:  Negative for back pain and neck pain.  Skin:  Negative for rash.  Neurological:  Positive for headaches. Negative for dizziness, facial asymmetry, speech difficulty, weakness, light-headedness and numbness.  Hematological:  Does not bruise/bleed easily.  Psychiatric/Behavioral:  Negative for confusion.     Physical Exam Updated Vital Signs BP (!) 157/94   Pulse 63   Temp 99 F (37.2 C) (Oral)   Resp 10   SpO2 100%  Physical Exam Vitals and nursing note reviewed.  Constitutional:      General: He is not in acute distress.  Appearance: He is well-developed.  HENT:     Head: Normocephalic and atraumatic.  Eyes:     Extraocular Movements: Extraocular movements intact.     Conjunctiva/sclera: Conjunctivae normal.     Pupils: Pupils are equal, round, and reactive to light.  Cardiovascular:     Rate and Rhythm: Normal rate and regular rhythm.     Heart sounds: No murmur heard. Pulmonary:     Effort: Pulmonary effort is normal. No respiratory distress.     Breath sounds: Normal breath sounds.  Abdominal:     Palpations: Abdomen is soft.     Tenderness: There is no abdominal  tenderness.  Musculoskeletal:        General: No swelling.     Cervical back: Normal range of motion and neck supple. No rigidity.  Skin:    General: Skin is warm and dry.     Capillary Refill: Capillary refill takes less than 2 seconds.  Neurological:     Mental Status: He is alert.     Cranial Nerves: No cranial nerve deficit or facial asymmetry.     Sensory: No sensory deficit.     Motor: No weakness.     Coordination: Coordination normal.  Psychiatric:        Mood and Affect: Mood normal.     ED Results / Procedures / Treatments   Labs (all labs ordered are listed, but only abnormal results are displayed) Labs Reviewed  CBC WITH DIFFERENTIAL/PLATELET  BASIC METABOLIC PANEL    EKG EKG Interpretation  Date/Time:  Sunday June 05 2022 08:36:31 EDT Ventricular Rate:  67 PR Interval:  135 QRS Duration: 103 QT Interval:  402 QTC Calculation: 425 R Axis:   77 Text Interpretation: Sinus rhythm Probable left ventricular hypertrophy Borderline T abnormalities, lateral leads ST elevation, consider anterior injury Baseline wander in lead(s) V3 Confirmed by Fredia Sorrow [phone]) on 06/05/2022 8:53:26 AM  Radiology No results found.  Procedures Procedures    Medications Ordered in ED Medications  HYDROmorphone (DILAUDID) injection 2 mg (2 mg Intramuscular Given 06/05/22 0913)    ED Course/ Medical Decision Making/ A&P                             Medical Decision Making Amount and/or Complexity of Data Reviewed Labs: ordered. Radiology: ordered.  Risk Prescription drug management.   Patient without history of migraines.  Headache for at least 2 days maybe longer.  Not sure where his blood pressure normally is but he says every time he goes to the doctor and has been elevated.  He did take his blood pressure medicine today.  Will give some IM hydromorphone will get CT head to rule out anything significant and acute.  Blood pressures here 190/1 oh want to start  with.  Will get basic labs to evaluate renal function.  Clinically patient's blood pressure could be up a little higher than usual due to the headache.  Or it is possible that his blood pressure is like this even without the headache.  Will need to get back in with primary care follow-up for blood pressure adjustment.  Because patient is on several blood pressure medicines already and it may not be controlling his blood pressure appropriately.  Clinically patient without severe headache without any strokelike symptoms.  No chest pain no shortness of breath.  Patient's labs CBC normal white blood count 4.5 hemoglobin 16.8.  Patient metabolic panel very reassuring renal function normal  electrolytes normal.  CT head without any acute findings.  Following the hydromorphone patient with significant improvement in the head pain.  And blood pressure actually right now is 140/90 patient did take his blood pressure medicine this morning.  No acute findings.  Does need close follow-up for his blood pressure with primary care provider.   Final Clinical Impression(s) / ED Diagnoses Final diagnoses:  Primary hypertension  Acute intractable headache, unspecified headache type    Rx / DC Orders ED Discharge Orders     None         Fredia Sorrow, MD 06/05/22 [id number]    Fredia Sorrow, MD 06/05/22 [id number]    Fredia Sorrow, MD 06/05/22 1147

## 2022-06-05 NOTE — Discharge Instructions (Addendum)
Blood pressure improving here continue to take your blood pressure medicines as prescribed.  Try to follow back up with Dr. Moshe Cipro or get a new primary care doctor.  Labs here today were normal head CT was negative.  It is okay to take extra strength Tylenol for the headache.  Return for any new or worse symptoms.

## 2022-06-10 ENCOUNTER — Emergency Department (HOSPITAL_COMMUNITY): Payer: BC Managed Care – PPO

## 2022-06-10 ENCOUNTER — Other Ambulatory Visit: Payer: Self-pay

## 2022-06-10 ENCOUNTER — Encounter (HOSPITAL_COMMUNITY): Payer: Self-pay

## 2022-06-10 ENCOUNTER — Emergency Department (HOSPITAL_COMMUNITY)
Admission: EM | Admit: 2022-06-10 | Discharge: 2022-06-10 | Disposition: A | Discharge status: Home / Self Care | Payer: BC Managed Care – PPO | Attending: Emergency Medicine | Admitting: Emergency Medicine

## 2022-06-10 DIAGNOSIS — R079 Chest pain, unspecified: Secondary | ICD-10-CM | POA: Diagnosis not present

## 2022-06-10 DIAGNOSIS — I10 Essential (primary) hypertension: Secondary | ICD-10-CM | POA: Diagnosis not present

## 2022-06-10 DIAGNOSIS — R1013 Epigastric pain: Secondary | ICD-10-CM | POA: Diagnosis not present

## 2022-06-10 DIAGNOSIS — R0789 Other chest pain: Secondary | ICD-10-CM | POA: Insufficient documentation

## 2022-06-10 DIAGNOSIS — Z79899 Other long term (current) drug therapy: Secondary | ICD-10-CM | POA: Insufficient documentation

## 2022-06-10 DIAGNOSIS — R519 Headache, unspecified: Secondary | ICD-10-CM | POA: Diagnosis not present

## 2022-06-10 DIAGNOSIS — N281 Cyst of kidney, acquired: Secondary | ICD-10-CM | POA: Diagnosis not present

## 2022-06-10 LAB — CBC
HCT: 47.5 % (ref 39.0–52.0)
Hemoglobin: 16.4 g/dL (ref 13.0–17.0)
MCH: 31.2 pg (ref 26.0–34.0)
MCHC: 34.5 g/dL (ref 30.0–36.0)
MCV: 90.3 fL (ref 80.0–100.0)
Platelets: 201 10*3/uL (ref 150–400)
RBC: 5.26 MIL/uL (ref 4.22–5.81)
RDW: 12.3 % (ref 11.5–15.5)
WBC: 3.9 10*3/uL — ABNORMAL LOW (ref 4.0–10.5)
nRBC: 0 % (ref 0.0–0.2)

## 2022-06-10 LAB — HEPATIC FUNCTION PANEL
ALT: 40 U/L (ref 0–44)
AST: 52 U/L — ABNORMAL HIGH (ref 15–41)
Albumin: 4.4 g/dL (ref 3.5–5.0)
Alkaline Phosphatase: 53 U/L (ref 38–126)
Bilirubin, Direct: 0.1 mg/dL (ref 0.0–0.2)
Indirect Bilirubin: 0.4 mg/dL (ref 0.3–0.9)
Total Bilirubin: 0.5 mg/dL (ref 0.3–1.2)
Total Protein: 8.3 g/dL — ABNORMAL HIGH (ref 6.5–8.1)

## 2022-06-10 LAB — BASIC METABOLIC PANEL WITH GFR
Anion gap: 9 (ref 5–15)
BUN: 17 mg/dL (ref 6–20)
CO2: 25 mmol/L (ref 22–32)
Calcium: 9 mg/dL (ref 8.9–10.3)
Chloride: 96 mmol/L — ABNORMAL LOW (ref 98–111)
Creatinine, Ser: 1.32 mg/dL — ABNORMAL HIGH (ref 0.61–1.24)
GFR, Estimated: >60 mL/min
Glucose, Bld: 104 mg/dL — ABNORMAL HIGH (ref 70–99)
Potassium: 3.6 mmol/L (ref 3.5–5.1)
Sodium: 130 mmol/L — ABNORMAL LOW (ref 135–145)

## 2022-06-10 LAB — TROPONIN I (HIGH SENSITIVITY)
Troponin I (High Sensitivity): 17 ng/L (ref <18)
Troponin I (High Sensitivity): 17 ng/L

## 2022-06-10 LAB — LIPASE, BLOOD: Lipase: 58 U/L — ABNORMAL HIGH (ref 11–51)

## 2022-06-10 MED ORDER — METOCLOPRAMIDE HCL 5 MG/ML IJ SOLN
10.0000 mg | Freq: Once | INTRAMUSCULAR | Status: AC
  Administered 2022-06-10: 10 mg via INTRAVENOUS
  Filled 2022-06-10: qty 2

## 2022-06-10 MED ORDER — PANTOPRAZOLE SODIUM 40 MG IV SOLR
40.0000 mg | Freq: Once | INTRAVENOUS | Status: AC
  Administered 2022-06-10: 40 mg via INTRAVENOUS
  Filled 2022-06-10: qty 10

## 2022-06-10 MED ORDER — IOHEXOL 350 MG/ML SOLN
100.0000 mL | Freq: Once | INTRAVENOUS | Status: AC | PRN
  Administered 2022-06-10: 100 mL via INTRAVENOUS

## 2022-06-10 MED ORDER — MORPHINE SULFATE (PF) 4 MG/ML IV SOLN
4.0000 mg | Freq: Once | INTRAVENOUS | Status: AC
  Administered 2022-06-10: 4 mg via INTRAVENOUS
  Filled 2022-06-10: qty 1

## 2022-06-10 MED ORDER — SODIUM CHLORIDE (PF) 0.9 % IJ SOLN
INTRAMUSCULAR | Status: AC
  Filled 2022-06-10: qty 50

## 2022-06-10 NOTE — ED Provider Notes (Signed)
[facility] AT [facility] Provider Note   CSN: [id number] Arrival date & time: 06/10/22  [id number]     History  Chief Complaint  Patient presents with   Chest Pain    GEORGI ORD is a 57 y.o. male.  The history is provided by the patient, the spouse and medical records.  Chest Pain RODRECUS KALTENBACH is a 57 y.o. male who presents to the Emergency Department complaining of headache and chest pain.  He presents to the emergency department for evaluation of left-sided chest pain that started 2 days ago.  Pain is described as a shooting pain that is in his left chest and epigastric region that radiates to his left arm and to his head.  Pain waxes and wanes but never fully resolves.  He did take Tylenol as well as gas/reflux medicine without improvement in his symptoms.  No clear alleviating or worsening symptoms.  On Sunday he developed a headache and was evaluated in the emergency department at that time.  He did not have chest pain at that time.  The current headache that he has a similar to his prior headache that he had on Sunday.  No associated fever, cough, abdominal pain, nausea, vomiting.  He does feel short of breath when he has the pain episodes but does not have shortness of breath between episodes.   Hx/o HTN  Drinks alcohol daily.    Home Medications Prior to Admission medications   Medication Sig Start Date End Date Taking? Authorizing Provider  amLODipine (NORVASC) 10 MG tablet Take 1 tablet (10 mg total) by mouth daily. 05/14/21   Paseda, Dewaine Conger, FNP  benazepril (LOTENSIN) 40 MG tablet Take 1 tablet (40 mg total) by mouth daily. 05/14/21   Renee Rival, FNP  calamine lotion Apply 1 application topically as needed for itching. Patient not taking: Reported on 02/21/2022 04/22/20   Lindell Spar, MD  hydrALAZINE (APRESOLINE) 25 MG tablet Take 1 tablet (25 mg total) by mouth 3 (three) times daily. Patient not taking: Reported on  01/24/2022 07/28/21   Renee Rival, FNP  hydrochlorothiazide (HYDRODIURIL) 25 MG tablet Take 1 tablet (25 mg total) by mouth daily. 05/14/21   Renee Rival, FNP  hydrOXYzine (ATARAX/VISTARIL) 25 MG tablet Take 1 tablet (25 mg total) by mouth 2 (two) times daily as needed. Patient not taking: Reported on 02/21/2022 11/19/20   Noreene Larsson, NP  pantoprazole (PROTONIX) 40 MG tablet Take 40 mg by mouth daily.    [provider]  tadalafil (CIALIS) 10 MG tablet TAKE 1 TABLET BY MOUTH ONCE DAILY AS NEEDED FOR  ED 03/16/21   Noreene Larsson, NP      Allergies    Tramadol    Review of Systems   Review of Systems  Cardiovascular:  Positive for chest pain.  All other systems reviewed and are negative.   Physical Exam Updated Vital Signs BP (!) 164/93   Pulse 71   Temp 98.1 F (36.7 C) (Oral)   Resp 17   SpO2 100%  Physical Exam Vitals and nursing note reviewed.  Constitutional:      Appearance: He is well-developed.  HENT:     Head: Normocephalic and atraumatic.  Cardiovascular:     Rate and Rhythm: Normal rate and regular rhythm.     Heart sounds: No murmur heard. Pulmonary:     Effort: Pulmonary effort is normal. No respiratory distress.     Breath sounds: Normal  breath sounds.  Abdominal:     Palpations: Abdomen is soft.     Tenderness: There is no abdominal tenderness. There is no guarding or rebound.  Musculoskeletal:        General: No tenderness.     Cervical back: Neck supple.     Comments: 2+ radial and DP pulses bilaterally  Skin:    General: Skin is warm and dry.  Neurological:     Mental Status: He is alert and oriented to person, place, and time.     Comments: 5 out of 5 strength in all 4 extremities  Psychiatric:        Behavior: Behavior normal.     ED Results / Procedures / Treatments   Labs (all labs ordered are listed, but only abnormal results are displayed) Labs Reviewed  BASIC METABOLIC PANEL  CBC  TROPONIN I (HIGH SENSITIVITY)     EKG EKG Interpretation  Date/Time:  Friday June 10 2022 05:26:38 EDT Ventricular Rate:  71 PR Interval:  131 QRS Duration: 113 QT Interval:  393 QTC Calculation: 428 R Axis:   74 Text Interpretation: Sinus rhythm RSR' in V1 or V2, right VCD or RVH Probable left ventricular hypertrophy Nonspecific T abnormalities, inferior leads Anterior ST elevation, probably due to LVH Confirmed by Quintella Reichert [phone]) on 06/10/2022 5:32:58 AM  Radiology No results found.  Procedures Procedures    Medications Ordered in ED Medications - No data to display  ED Course/ Medical Decision Making/ A&P                             Medical Decision Making Amount and/or Complexity of Data Reviewed Labs: ordered. Radiology: ordered.  Risk Prescription drug management.   Patient here for evaluation of chest pain primarily but also complains of headache.  EKG does show some hypertrophy changes, similar but slightly more pronounced than when compared to priors.  He is well-perfused on examination with symmetric pulses and no focal neurologic deficits.  Current clinical picture is not consistent with CVA, subarachnoid hemorrhage.  In terms of his chest pain-initial troponin is within normal limits.  Plan to check repeat troponin as well as CTA to rule out dissection or additional complicating feature.  Patient's headache is essentially resolved after medication administration and his chest pain is significantly improved.  Patient care transferred pending additional studies.       Final Clinical Impression(s) / ED Diagnoses Final diagnoses:  None    Rx / DC Orders ED Discharge Orders     None         Quintella Reichert, MD 06/10/22 [phone]

## 2022-06-10 NOTE — Discharge Instructions (Signed)
As discussed, today's evaluation has been generally reassuring.  There are some evidence that your symptoms are due to mild pancreatitis.  For the next 2 days please avoid solid foods, drink only liquids, preferably clear liquids.  You may then begin eating a normal diet gradually again.  Please schedule follow-up with your primary care physician next week.  At that visit discuss today's evaluation, and your CT results.  You may require additional evaluation including imaging at a later date, which can be done safely as an outpatient.   [id number]:  1. No evidence of acute aortic syndrome or other acute pathology in  the chest, abdomen, or pelvis.  2. 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 Recommend MRI with and without contrast  for further evaluation, and recommend correlation for any signs or  symptoms of myasthenia gravis.  3. 1.3 cm ground-glass opacity in the right lower lobe, nonspecific.  Per Fleischner Society Guidelines, recommend a non-contrast Chest CT  at 6 months to confirm persistence, then additional non-contrast  Chest CTs every 2 years until 5 years stability has been  established.

## 2022-06-10 NOTE — ED Provider Notes (Signed)
Patient awake, alert, sitting upright, no distress, no complaints.  Assumed care of the patient at signout. I have reviewed his labs, notable for no change in his troponin values, slight elevation in lipase which is likely contributing to his discomfort, which not currently present. Patient does have a slight abnormality on CT which can be follow-up safely as an outpatient.  Patient discharged with CT printed out, follow-up with primary care.   Carmin Muskrat, MD 06/10/22 [phone]

## 2022-06-10 NOTE — ED Notes (Signed)
AVS provided to and discussed with patient and family member at bedside. Pt verbalizes understanding of discharge instructions and denies any questions or concerns at this time. Pt has ride home. Pt ambulated out of department independently with steady gait.  

## 2022-06-10 NOTE — ED Triage Notes (Signed)
Pt has been having central CP with radiation to L arm, and neck since last Sunday, denies SOB or nausea.

## 2022-06-13 DIAGNOSIS — J9859 Other diseases of mediastinum, not elsewhere classified: Secondary | ICD-10-CM | POA: Diagnosis not present

## 2022-06-13 DIAGNOSIS — I10 Essential (primary) hypertension: Secondary | ICD-10-CM | POA: Diagnosis not present

## 2022-06-13 DIAGNOSIS — R918 Other nonspecific abnormal finding of lung field: Secondary | ICD-10-CM | POA: Diagnosis not present

## 2022-06-15 ENCOUNTER — Other Ambulatory Visit: Payer: Self-pay | Admitting: Internal Medicine

## 2022-06-15 DIAGNOSIS — J9859 Other diseases of mediastinum, not elsewhere classified: Secondary | ICD-10-CM

## 2022-06-23 DIAGNOSIS — R9389 Abnormal findings on diagnostic imaging of other specified body structures: Secondary | ICD-10-CM | POA: Diagnosis not present

## 2022-06-23 DIAGNOSIS — Z1330 Encounter for screening examination for mental health and behavioral disorders, unspecified: Secondary | ICD-10-CM | POA: Diagnosis not present

## 2022-07-05 ENCOUNTER — Ambulatory Visit
Admission: RE | Admit: 2022-07-05 | Discharge: 2022-07-05 | Disposition: A | Discharge status: Home / Self Care | Payer: BC Managed Care – PPO | Source: Ambulatory Visit | Attending: Internal Medicine | Admitting: Internal Medicine

## 2022-07-05 DIAGNOSIS — J9859 Other diseases of mediastinum, not elsewhere classified: Secondary | ICD-10-CM | POA: Diagnosis not present

## 2022-07-05 MED ORDER — GADOPICLENOL 0.5 MMOL/ML IV SOLN
7.5000 mL | Freq: Once | INTRAVENOUS | Status: AC | PRN
  Administered 2022-07-05: 7.5 mL via INTRAVENOUS

## 2022-07-13 NOTE — H&P (View-Only) (Signed)
[address]       [place] [address]             [phone]                    Corey Thomas [facility] Medical Record #[id number] Date of Birth: 08-Sep-1965  Referring: Georgann Housekeeper, MD Primary Care: Patient, No Pcp Per Primary Cardiologist: None  Chief Complaint:    Chief Complaint  Patient presents with   Thymoma    New patient consult, CTA c/a/p 3/22, MR chest 4/16    History of Present Illness:    Corey Thomas 57 y.o. 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  This was found incidentally.  He has undergon an MRI.  He does complain of some chest tenderness at 4:00 in the morning every day, but this usually resolves within an hour.    Past Medical History:  Diagnosis Date   Arthritis    Diverticulosis 12/15/2016   GERD without esophagitis 06/08/2016   HLD (hyperlipidemia) 07/15/2016   Hypertension    Tubular adenoma of colon 11/14/2016   Vitamin D deficiency     Past Surgical History:  Procedure Laterality Date   COLONOSCOPY N/A 11/11/2016   Procedure: COLONOSCOPY;  Surgeon: Malissa Hippo, MD;  Location: [facility];  Service: Endoscopy;  Laterality: N/A;  12:55-rescheduled to 8/24 @ 10:25am per Dewayne Hatch   COLONOSCOPY WITH PROPOFOL N/A 02/24/2022   Procedure: COLONOSCOPY WITH PROPOFOL;  Surgeon: Dolores Frame, MD;  Location: [facility];  Service: Gastroenterology;  Laterality: N/A;  2:00pm, asa 3   NO PAST SURGERIES     POLYPECTOMY  02/24/2022   Procedure: POLYPECTOMY INTESTINAL;  Surgeon: Marguerita Merles, Reuel Boom, MD;  Location: [facility];  Service: Gastroenterology;;    Family History  Problem Relation Age of Onset   Healthy Sister    Healthy Sister    Alcohol abuse Brother 53       cirrhosis   Diabetes Other    Osteoarthritis Other    Arthritis Maternal Grandmother      Social History   Tobacco Use  Smoking Status Never   Passive exposure: Past   Smokeless Tobacco Current   Types: Snuff  Tobacco Comments   1/2 can daily     Social History   Substance and Sexual Activity  Alcohol Use Yes   Comment: occasional     Allergies  Allergen Reactions   Tramadol Other (See Comments)    Patient states "cramps"    Current Outpatient Medications  Medication Sig Dispense Refill   amLODipine (NORVASC) 10 MG tablet Take 1 tablet (10 mg total) by mouth daily. 90 tablet 0   benazepril (LOTENSIN) 40 MG tablet Take 1 tablet (40 mg total) by mouth daily. 90 tablet 0   hydrochlorothiazide (HYDRODIURIL) 25 MG tablet Take 1 tablet (25 mg total) by mouth daily. 90 tablet 0   No current facility-administered medications for this visit.    Review of Systems  Constitutional:  Negative for fever.  Respiratory:  Negative for cough and shortness of breath.   Cardiovascular:  Positive for chest pain.  Neurological: Negative.     PHYSICAL EXAMINATION: BP (!) 146/80 (BP Location: Right Arm, Patient Position: Sitting, Cuff Size: Large)   Pulse 78   Resp 20   Ht 6' (1.829 m)   Wt 202 lb (91.6 kg)   SpO2 99% Comment: RA  BMI 27.40 kg/m   Physical Exam Constitutional:      General: He is not in acute distress.    Appearance: He is normal weight.  HENT:     Head: Normocephalic and atraumatic.  Eyes:     Extraocular Movements: Extraocular movements intact.  Cardiovascular:     Rate and Rhythm: Normal rate.  Pulmonary:     Effort: Pulmonary effort is normal. No respiratory distress.  Abdominal:     General: Abdomen is flat. There is no distension.  Musculoskeletal:        General: Normal range of motion.     Cervical back: Normal range of motion.  Skin:    General: Skin is warm and dry.  Neurological:     General: No focal deficit present.     Mental Status: He is alert and oriented to person, place, and time.      Diagnostic Studies & Laboratory data:     Recent Radiology Findings:   MR CHEST W WO CONTRAST  Result Date:  07/08/2022 CLINICAL DATA:  Mediastinal mass EXAM: MR CHEST WITH AND WITHOUT CONTRAST TECHNIQUE: Multiplanar, multisequence MR imaging of the chest was performed before and after the administration of intravenous contrast. CONTRAST:  7.5 mL Vueway gadolinium contrast IV COMPARISON:  CT chest angiogram, 06/10/2022 FINDINGS: Cardiovascular: No significant vascular findings. Normal heart size. No pericardial effusion. Mediastinum/Nodes: No enlarged mediastinal, hilar, or axillary lymph nodes. Heterogeneously enhancing mixed solid and cystic nodular soft tissue in the anterior mediastinum, overall dimensions approximately 6.7 x 3.3 x 2.2 cm (series 7, image 21, series 5, image 15). Thyroid gland, trachea, and esophagus demonstrate no significant findings. Limited lungs/Pleura: Lungs are clear without obvious abnormality, poorly assessed by MR. No pleural effusion or pneumothorax. Upper Abdomen: No acute abnormality. Musculoskeletal: No chest wall abnormality. No acute osseous findings. IMPRESSION: Heterogeneously enhancing mixed solid and cystic nodular soft tissue in the anterior mediastinum, overall dimensions approximately 6.7 x 3.3 x 2.2 cm. 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 Electronically Signed   By: Jearld Lesch M.D.   On: 07/08/2022 14:23       I have independently reviewed the above radiology studies  and reviewed the findings with the patient.   Recent Lab Findings: Lab Results  Component Value Date   WBC 3.9 (L) 06/10/2022   HGB 16.4 06/10/2022   HCT 47.5 06/10/2022   PLT 201 06/10/2022   GLUCOSE 104 (H) 06/10/2022   CHOL 204 (H) 09/20/2018   TRIG 86 09/20/2018   HDL 58 09/20/2018   LDLCALC 127 (H) 09/20/2018   ALT 40 06/10/2022   AST 52 (H) 06/10/2022   NA 130 (L) 06/10/2022   K 3.6 06/10/2022   CL 96 (L) 06/10/2022   CREATININE 1.32 (H) 06/10/2022   BUN 17 06/10/2022   CO2 25 06/10/2022   TSH 2.102 11/21/2013   HGBA1C 5.1 09/20/2018     MRI:  IMPRESSION: Heterogeneously enhancing mixed solid and cystic nodular soft tissue in the anterior mediastinum, overall dimensions approximately 6.7 x 3.3 x 2.2 cm. 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  I will order tumor markers and antibodies for completion sake.  We discussed the risks and benefits of a R RATS, and resection of anterior mediastinal mass.  He is agreeable to proceed.      Corliss Skains 07/15/2022 11:39 AM

## 2022-07-13 NOTE — Progress Notes (Signed)
[address]       [place] [address]             [phone]                    Corey Thomas [facility] Medical Record #[id number] Date of Birth: 08-Sep-1965  Referring: Georgann Housekeeper, MD Primary Care: Patient, No Pcp Per Primary Cardiologist: None  Chief Complaint:    Chief Complaint  Patient presents with   Thymoma    New patient consult, CTA c/a/p 3/22, MR chest 4/16    History of Present Illness:    Corey Thomas 57 y.o. 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  This was found incidentally.  He has undergon an MRI.  He does complain of some chest tenderness at 4:00 in the morning every day, but this usually resolves within an hour.    Past Medical History:  Diagnosis Date   Arthritis    Diverticulosis 12/15/2016   GERD without esophagitis 06/08/2016   HLD (hyperlipidemia) 07/15/2016   Hypertension    Tubular adenoma of colon 11/14/2016   Vitamin D deficiency     Past Surgical History:  Procedure Laterality Date   COLONOSCOPY N/A 11/11/2016   Procedure: COLONOSCOPY;  Surgeon: Malissa Hippo, MD;  [facility];  Service: Endoscopy;  Laterality: N/A;  12:55-rescheduled to 8/24 @ 10:25am per Dewayne Hatch   COLONOSCOPY WITH PROPOFOL N/A 02/24/2022   Procedure: COLONOSCOPY WITH PROPOFOL;  Surgeon: Dolores Frame, MD;  Location: [facility];  Service: Gastroenterology;  Laterality: N/A;  2:00pm, asa 3   NO PAST SURGERIES     POLYPECTOMY  02/24/2022   Procedure: POLYPECTOMY INTESTINAL;  Surgeon: Marguerita Merles, Reuel Boom, MD;  Location: [facility];  Service: Gastroenterology;;    Family History  Problem Relation Age of Onset   Healthy Sister    Healthy Sister    Alcohol abuse Brother 53       cirrhosis   Diabetes Other    Osteoarthritis Other    Arthritis Maternal Grandmother      Social History   Tobacco Use  Smoking Status Never   Passive exposure: Past   Smokeless Tobacco Current   Types: Snuff  Tobacco Comments   1/2 can daily     Social History   Substance and Sexual Activity  Alcohol Use Yes   Comment: occasional     Allergies  Allergen Reactions   Tramadol Other (See Comments)    Patient states "cramps"    Current Outpatient Medications  Medication Sig Dispense Refill   amLODipine (NORVASC) 10 MG tablet Take 1 tablet (10 mg total) by mouth daily. 90 tablet 0   benazepril (LOTENSIN) 40 MG tablet Take 1 tablet (40 mg total) by mouth daily. 90 tablet 0   hydrochlorothiazide (HYDRODIURIL) 25 MG tablet Take 1 tablet (25 mg total) by mouth daily. 90 tablet 0   No current facility-administered medications for this visit.    Review of Systems  Constitutional:  Negative for fever.  Respiratory:  Negative for cough and shortness of breath.   Cardiovascular:  Positive for chest pain.  Neurological: Negative.     PHYSICAL EXAMINATION: BP (!) 146/80 (BP Location: Right Arm, Patient Position: Sitting, Cuff Size: Large)   Pulse 78   Resp 20   Ht 6' (1.829 m)   Wt 202 lb (91.6 kg)   SpO2 99% Comment: RA  BMI 27.40 kg/m   Physical Exam Constitutional:      General: He is not in acute distress.    Appearance: He is normal weight.  HENT:     Head: Normocephalic and atraumatic.  Eyes:     Extraocular Movements: Extraocular movements intact.  Cardiovascular:     Rate and Rhythm: Normal rate.  Pulmonary:     Effort: Pulmonary effort is normal. No respiratory distress.  Abdominal:     General: Abdomen is flat. There is no distension.  Musculoskeletal:        General: Normal range of motion.     Cervical back: Normal range of motion.  Skin:    General: Skin is warm and dry.  Neurological:     General: No focal deficit present.     Mental Status: He is alert and oriented to person, place, and time.      Diagnostic Studies & Laboratory data:     Recent Radiology Findings:   MR CHEST W WO CONTRAST  Result Date:  07/08/2022 CLINICAL DATA:  Mediastinal mass EXAM: MR CHEST WITH AND WITHOUT CONTRAST TECHNIQUE: Multiplanar, multisequence MR imaging of the chest was performed before and after the administration of intravenous contrast. CONTRAST:  7.5 mL Vueway gadolinium contrast IV COMPARISON:  CT chest angiogram, 06/10/2022 FINDINGS: Cardiovascular: No significant vascular findings. Normal heart size. No pericardial effusion. Mediastinum/Nodes: No enlarged mediastinal, hilar, or axillary lymph nodes. Heterogeneously enhancing mixed solid and cystic nodular soft tissue in the anterior mediastinum, overall dimensions approximately 6.7 x 3.3 x 2.2 cm (series 7, image 21, series 5, image 15). Thyroid gland, trachea, and esophagus demonstrate no significant findings. Limited lungs/Pleura: Lungs are clear without obvious abnormality, poorly assessed by MR. No pleural effusion or pneumothorax. Upper Abdomen: No acute abnormality. Musculoskeletal: No chest wall abnormality. No acute osseous findings. IMPRESSION: Heterogeneously enhancing mixed solid and cystic nodular soft tissue in the anterior mediastinum, overall dimensions approximately 6.7 x 3.3 x 2.2 cm. 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 Electronically Signed   By: Jearld Lesch M.D.   On: 07/08/2022 14:23       I have independently reviewed the above radiology studies  and reviewed the findings with the patient.   Recent Lab Findings: Lab Results  Component Value Date   WBC 3.9 (L) 06/10/2022   HGB 16.4 06/10/2022   HCT 47.5 06/10/2022   PLT 201 06/10/2022   GLUCOSE 104 (H) 06/10/2022   CHOL 204 (H) 09/20/2018   TRIG 86 09/20/2018   HDL 58 09/20/2018   LDLCALC 127 (H) 09/20/2018   ALT 40 06/10/2022   AST 52 (H) 06/10/2022   NA 130 (L) 06/10/2022   K 3.6 06/10/2022   CL 96 (L) 06/10/2022   CREATININE 1.32 (H) 06/10/2022   BUN 17 06/10/2022   CO2 25 06/10/2022   TSH 2.102 11/21/2013   HGBA1C 5.1 09/20/2018     MRI:  IMPRESSION: Heterogeneously enhancing mixed solid and cystic nodular soft tissue in the anterior mediastinum, overall dimensions approximately 6.7 x 3.3 x 2.2 cm. 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  I will order tumor markers and antibodies for completion sake.  We discussed the risks and benefits of a R RATS, and resection of anterior mediastinal mass.  He is agreeable to proceed.      Corliss Skains 07/15/2022 11:39 AM

## 2022-07-15 ENCOUNTER — Institutional Professional Consult (permissible substitution) (INDEPENDENT_AMBULATORY_CARE_PROVIDER_SITE_OTHER): Payer: BC Managed Care – PPO | Admitting: Thoracic Surgery (Cardiothoracic Vascular Surgery)

## 2022-07-15 ENCOUNTER — Encounter: Payer: Self-pay | Admitting: Thoracic Surgery (Cardiothoracic Vascular Surgery)

## 2022-07-15 ENCOUNTER — Other Ambulatory Visit: Payer: Self-pay | Admitting: *Deleted

## 2022-07-15 VITALS — BP 146/80 | HR 78 | Resp 20 | Ht 72.0 in | Wt 202.0 lb

## 2022-07-15 DIAGNOSIS — D4989 Neoplasm of unspecified behavior of other specified sites: Secondary | ICD-10-CM

## 2022-07-15 NOTE — Progress Notes (Signed)
Surgical Instructions    Your procedure is scheduled on Wednesday, Jul 20, 2022.  Report to [facility] Main Entrance "A" at 10:00 A.M., then check in with the Admitting office.  Call this number if you have problems the morning of surgery:  [phone]   If you have any questions prior to your surgery date call [phone]: Open Monday-Friday 8am-4pm If you experience any cold or flu symptoms such as cough, fever, chills, shortness of breath, etc. between now and your scheduled surgery, please notify us at the above number     Remember:  Do not eat or drink after midnight the night before your surgery  Take these medicines the morning of surgery with A SIP OF WATER:  amLODipine (NORVASC)   As of today, STOP taking any Aspirin (unless otherwise instructed by your surgeon) Aleve, Naproxen, Ibuprofen, Motrin, Advil, Goody's, BC's, all herbal medications, fish oil, and all vitamins.   Do NOT Smoke (Tobacco/Vaping)  24 hours prior to your procedure  If you use a CPAP at night, you may bring your mask for your overnight stay.   Contacts, glasses, hearing aids, dentures or partials may not be worn into surgery, please bring cases for these belongings   For patients admitted to the hospital, discharge time will be determined by your treatment team.   Patients discharged the day of surgery will not be allowed to drive home, and someone needs to stay with them for 24 hours.   SURGICAL WAITING ROOM VISITATION Patients having surgery or a procedure may have no more than 2 support people in the waiting area - these visitors may rotate.   Children under the age of 56 must have an adult with them who is not the patient. If the patient needs to stay at the hospital during part of their recovery, the visitor guidelines for inpatient rooms apply. Pre-op nurse will coordinate an appropriate time for 1 support person to accompany patient in pre-op.  This support person may not rotate.   Please  refer to [email or web address] for the visitor guidelines for Inpatients (after your surgery is over and you are in a regular room).    Special instructions:    Oral Hygiene is also important to reduce your risk of infection.  Remember - BRUSH YOUR TEETH THE MORNING OF SURGERY WITH YOUR REGULAR TOOTHPASTE   [place]- Preparing For Surgery  Before surgery, you can play an important role. Because skin is not sterile, your skin needs to be as free of germs as possible. You can reduce the number of germs on your skin by washing with CHG (chlorahexidine gluconate) Soap before surgery.  CHG is an antiseptic cleaner which kills germs and bonds with the skin to continue killing germs even after washing.     Please do not use if you have an allergy to CHG or antibacterial soaps. If your skin becomes reddened/irritated stop using the CHG.  Do not shave (including legs and underarms) for at least 48 hours prior to first CHG shower. It is OK to shave your face.  Please follow these instructions carefully.     Shower the NIGHT BEFORE SURGERY with CHG Soap.   If you chose to wash your hair, wash your hair first as usual with your normal shampoo. After you shampoo, rinse your hair and body thoroughly to remove the shampoo.  Then [employer] and genitals (private parts) with your normal soap and rinse thoroughly to remove soap.  After that Use CHG Soap as you  would any other liquid soap. You can apply CHG directly to the skin and wash gently with a scrungie or a clean washcloth.   Apply the CHG Soap to your body ONLY FROM THE NECK DOWN.  Do not use on open wounds or open sores. Avoid contact with your eyes, ears, mouth and genitals (private parts).   Wash thoroughly, paying special attention to the area where your surgery will be performed.  Thoroughly rinse your body with warm water from the neck down.  DO NOT shower/wash with your normal soap after  using and rinsing off the CHG Soap.  Pat yourself dry with a CLEAN TOWEL.  Wear CLEAN PAJAMAS to bed the night before surgery  Place CLEAN SHEETS on your bed the night before your surgery  DO NOT SLEEP WITH PETS.   Day of Surgery:  Take a shower with CHG soap. Wear Clean/Comfortable clothing the morning of surgery Do not wear jewelry. Do not wear lotions, powders, cologne or deodorant. Men may shave face and neck. Do not bring valuables to the hospital.  [facility] is not responsible for any belongings or valuables.     Remember to brush your teeth WITH YOUR REGULAR TOOTHPASTE.    If you received a COVID test during your pre-op visit, it is requested that you wear a mask when out in public, stay away from anyone that may not be feeling well, and notify your surgeon if you develop symptoms. If you have been in contact with anyone that has tested positive in the last 10 days, please notify your surgeon.    Please read over the following fact sheets that you were given.

## 2022-07-18 ENCOUNTER — Ambulatory Visit (HOSPITAL_COMMUNITY)
Admission: RE | Admit: 2022-07-18 | Discharge: 2022-07-18 | Disposition: A | Discharge status: Home / Self Care | Payer: BC Managed Care – PPO | Source: Ambulatory Visit | Attending: Thoracic Surgery (Cardiothoracic Vascular Surgery) | Admitting: Thoracic Surgery (Cardiothoracic Vascular Surgery)

## 2022-07-18 ENCOUNTER — Other Ambulatory Visit: Payer: Self-pay

## 2022-07-18 ENCOUNTER — Encounter (HOSPITAL_COMMUNITY): Payer: Self-pay

## 2022-07-18 ENCOUNTER — Encounter (HOSPITAL_COMMUNITY)
Admission: RE | Admit: 2022-07-18 | Discharge: 2022-07-18 | Disposition: A | Discharge status: Home / Self Care | Payer: BC Managed Care – PPO | Source: Ambulatory Visit | Attending: Thoracic Surgery (Cardiothoracic Vascular Surgery) | Admitting: Thoracic Surgery (Cardiothoracic Vascular Surgery)

## 2022-07-18 VITALS — BP 140/81 | HR 69 | Temp 98.5°F | Resp 18 | Ht 72.0 in | Wt 201.6 lb

## 2022-07-18 DIAGNOSIS — E785 Hyperlipidemia, unspecified: Secondary | ICD-10-CM | POA: Diagnosis not present

## 2022-07-18 DIAGNOSIS — Z885 Allergy status to narcotic agent status: Secondary | ICD-10-CM | POA: Diagnosis not present

## 2022-07-18 DIAGNOSIS — D4989 Neoplasm of unspecified behavior of other specified sites: Secondary | ICD-10-CM | POA: Insufficient documentation

## 2022-07-18 DIAGNOSIS — Y838 Other surgical procedures as the cause of abnormal reaction of the patient, or of later complication, without mention of misadventure at the time of the procedure: Secondary | ICD-10-CM | POA: Diagnosis not present

## 2022-07-18 DIAGNOSIS — T502X5A Adverse effect of carbonic-anhydrase inhibitors, benzothiadiazides and other diuretics, initial encounter: Secondary | ICD-10-CM | POA: Diagnosis not present

## 2022-07-18 DIAGNOSIS — Z8261 Family history of arthritis: Secondary | ICD-10-CM | POA: Diagnosis not present

## 2022-07-18 DIAGNOSIS — I9751 Accidental puncture and laceration of a circulatory system organ or structure during a circulatory system procedure: Secondary | ICD-10-CM | POA: Diagnosis not present

## 2022-07-18 DIAGNOSIS — D72829 Elevated white blood cell count, unspecified: Secondary | ICD-10-CM | POA: Diagnosis not present

## 2022-07-18 DIAGNOSIS — M1990 Unspecified osteoarthritis, unspecified site: Secondary | ICD-10-CM | POA: Diagnosis present

## 2022-07-18 DIAGNOSIS — J9859 Other diseases of mediastinum, not elsewhere classified: Secondary | ICD-10-CM | POA: Diagnosis present

## 2022-07-18 DIAGNOSIS — E559 Vitamin D deficiency, unspecified: Secondary | ICD-10-CM | POA: Diagnosis not present

## 2022-07-18 DIAGNOSIS — Z833 Family history of diabetes mellitus: Secondary | ICD-10-CM | POA: Diagnosis not present

## 2022-07-18 DIAGNOSIS — Z79899 Other long term (current) drug therapy: Secondary | ICD-10-CM | POA: Diagnosis not present

## 2022-07-18 DIAGNOSIS — D62 Acute posthemorrhagic anemia: Secondary | ICD-10-CM | POA: Diagnosis not present

## 2022-07-18 DIAGNOSIS — Z01818 Encounter for other preprocedural examination: Secondary | ICD-10-CM

## 2022-07-18 DIAGNOSIS — J90 Pleural effusion, not elsewhere classified: Secondary | ICD-10-CM | POA: Diagnosis not present

## 2022-07-18 DIAGNOSIS — D150 Benign neoplasm of thymus: Secondary | ICD-10-CM | POA: Diagnosis not present

## 2022-07-18 DIAGNOSIS — E876 Hypokalemia: Secondary | ICD-10-CM | POA: Diagnosis not present

## 2022-07-18 DIAGNOSIS — J939 Pneumothorax, unspecified: Secondary | ICD-10-CM | POA: Diagnosis not present

## 2022-07-18 DIAGNOSIS — Z86010 Personal history of colonic polyps: Secondary | ICD-10-CM | POA: Diagnosis not present

## 2022-07-18 DIAGNOSIS — R918 Other nonspecific abnormal finding of lung field: Secondary | ICD-10-CM | POA: Diagnosis not present

## 2022-07-18 DIAGNOSIS — J9811 Atelectasis: Secondary | ICD-10-CM | POA: Diagnosis not present

## 2022-07-18 DIAGNOSIS — F1020 Alcohol dependence, uncomplicated: Secondary | ICD-10-CM | POA: Diagnosis not present

## 2022-07-18 DIAGNOSIS — I10 Essential (primary) hypertension: Secondary | ICD-10-CM | POA: Diagnosis not present

## 2022-07-18 DIAGNOSIS — R222 Localized swelling, mass and lump, trunk: Secondary | ICD-10-CM | POA: Diagnosis not present

## 2022-07-18 DIAGNOSIS — Z811 Family history of alcohol abuse and dependence: Secondary | ICD-10-CM | POA: Diagnosis not present

## 2022-07-18 DIAGNOSIS — Z720 Tobacco use: Secondary | ICD-10-CM | POA: Diagnosis not present

## 2022-07-18 LAB — URINALYSIS, ROUTINE W REFLEX MICROSCOPIC
Bilirubin Urine: NEGATIVE
Glucose, UA: NEGATIVE mg/dL
Hgb urine dipstick: NEGATIVE
Ketones, ur: NEGATIVE mg/dL
Leukocytes,Ua: NEGATIVE
Nitrite: NEGATIVE
Protein, ur: NEGATIVE mg/dL
Specific Gravity, Urine: 1.009 (ref 1.005–1.030)
pH: 5 (ref 5.0–8.0)

## 2022-07-18 LAB — PROTIME-INR
INR: 1.1 (ref 0.8–1.2)
Prothrombin Time: 13.6 seconds (ref 11.4–15.2)

## 2022-07-18 LAB — CBC
HCT: 44.3 % (ref 39.0–52.0)
Hemoglobin: 16.2 g/dL (ref 13.0–17.0)
MCH: 31.7 pg (ref 26.0–34.0)
MCHC: 36.6 g/dL — ABNORMAL HIGH (ref 30.0–36.0)
MCV: 86.7 fL (ref 80.0–100.0)
Platelets: 235 10*3/uL (ref 150–400)
RBC: 5.11 MIL/uL (ref 4.22–5.81)
RDW: 12.6 % (ref 11.5–15.5)
WBC: 5.7 10*3/uL (ref 4.0–10.5)
nRBC: 0 % (ref 0.0–0.2)

## 2022-07-18 LAB — LACTATE DEHYDROGENASE: LDH: 124 U/L (ref 98–192)

## 2022-07-18 LAB — COMPREHENSIVE METABOLIC PANEL
ALT: 35 U/L (ref 0–44)
AST: 29 U/L (ref 15–41)
Albumin: 4 g/dL (ref 3.5–5.0)
Alkaline Phosphatase: 55 U/L (ref 38–126)
Anion gap: 12 (ref 5–15)
BUN: 14 mg/dL (ref 6–20)
CO2: 27 mmol/L (ref 22–32)
Calcium: 9.4 mg/dL (ref 8.9–10.3)
Chloride: 98 mmol/L (ref 98–111)
Creatinine, Ser: 1.2 mg/dL (ref 0.61–1.24)
GFR, Estimated: >60 mL/min (ref >60)
Glucose, Bld: 103 mg/dL — ABNORMAL HIGH (ref 70–99)
Potassium: 3.9 mmol/L (ref 3.5–5.1)
Sodium: 137 mmol/L (ref 135–145)
Total Bilirubin: 0.9 mg/dL (ref 0.3–1.2)
Total Protein: 7.7 g/dL (ref 6.5–8.1)

## 2022-07-18 LAB — SURGICAL PCR SCREEN
MRSA, PCR: NEGATIVE
Staphylococcus aureus: NEGATIVE

## 2022-07-18 LAB — TYPE AND SCREEN

## 2022-07-18 LAB — APTT: aPTT: 29 seconds (ref 24–36)

## 2022-07-18 NOTE — Progress Notes (Addendum)
PCP - [facility] Cardiologist - Denies  PPM/ICD - Denies  Chest x-ray - PAT appointment, 07/18/2022 EKG - PAT appointment, 07/18/2022 Stress Test -  ECHO -  Cardiac Cath -   Sleep Study - Denies CPAP - Denies  Non-diabetic  Blood Thinner Instructions: Denies Aspirin Instructions:Denies  ERAS Protcol -No, NPO PRE-SURGERY Ensure or G2-   COVID TEST- n/a  Anesthesia review: Yes. Mediastinal mass.  Please review labs drawn at PAT today.   Patient denies shortness of breath, fever, cough and chest pain at PAT appointment   All instructions explained to the patient, with a verbal understanding of the material. Patient agrees to go over the instructions while at home for a better understanding. Patient also instructed to self quarantine after being tested for COVID-19. The opportunity to ask questions was provided.

## 2022-07-19 ENCOUNTER — Telehealth: Payer: Self-pay

## 2022-07-19 LAB — AFP TUMOR MARKER: AFP, Serum, Tumor Marker: 5.5 ng/mL (ref 0.0–8.4)

## 2022-07-19 LAB — TSH: TSH: 1.936 u[IU]/mL (ref 0.350–4.500)

## 2022-07-19 NOTE — Telephone Encounter (Signed)
FMLA/STD forms completed./ Faxed to [employer] @[phone].  Beginning LOA 07/20/22 through 09/26/22. Stann Mainland pick up forms at front desk tomorrow.

## 2022-07-19 NOTE — Progress Notes (Signed)
Spoke with Corey Thomas in lab, they do not have enough blood to run T4 or AChR Ab w/reflex MuSK. This will need to be re-drawn on DOS and it is a send out with 2-5 day turn around time.

## 2022-07-20 ENCOUNTER — Encounter (HOSPITAL_COMMUNITY): Payer: Self-pay | Admitting: Thoracic Surgery (Cardiothoracic Vascular Surgery)

## 2022-07-20 ENCOUNTER — Other Ambulatory Visit: Payer: Self-pay

## 2022-07-20 ENCOUNTER — Inpatient Hospital Stay (HOSPITAL_COMMUNITY): Payer: BC Managed Care – PPO

## 2022-07-20 ENCOUNTER — Inpatient Hospital Stay (HOSPITAL_COMMUNITY)
Admission: RE | Admit: 2022-07-20 | Discharge: 2022-07-25 | DRG: 803 | Disposition: A | Discharge status: Home / Self Care | Payer: BC Managed Care – PPO | Attending: Thoracic Surgery (Cardiothoracic Vascular Surgery) | Admitting: Thoracic Surgery (Cardiothoracic Vascular Surgery)

## 2022-07-20 ENCOUNTER — Encounter (HOSPITAL_COMMUNITY)
Admission: RE | Disposition: A | Discharge status: Home / Self Care | Payer: Self-pay | Source: Home / Self Care | Attending: Thoracic Surgery (Cardiothoracic Vascular Surgery)

## 2022-07-20 ENCOUNTER — Inpatient Hospital Stay (HOSPITAL_COMMUNITY): Payer: BC Managed Care – PPO | Admitting: Vascular Surgery

## 2022-07-20 DIAGNOSIS — Z885 Allergy status to narcotic agent status: Secondary | ICD-10-CM

## 2022-07-20 DIAGNOSIS — Z720 Tobacco use: Secondary | ICD-10-CM

## 2022-07-20 DIAGNOSIS — E785 Hyperlipidemia, unspecified: Secondary | ICD-10-CM | POA: Diagnosis present

## 2022-07-20 DIAGNOSIS — D72829 Elevated white blood cell count, unspecified: Secondary | ICD-10-CM | POA: Diagnosis not present

## 2022-07-20 DIAGNOSIS — J9859 Other diseases of mediastinum, not elsewhere classified: Secondary | ICD-10-CM | POA: Diagnosis present

## 2022-07-20 DIAGNOSIS — Z79899 Other long term (current) drug therapy: Secondary | ICD-10-CM | POA: Diagnosis not present

## 2022-07-20 DIAGNOSIS — J9811 Atelectasis: Secondary | ICD-10-CM | POA: Diagnosis not present

## 2022-07-20 DIAGNOSIS — I9751 Accidental puncture and laceration of a circulatory system organ or structure during a circulatory system procedure: Secondary | ICD-10-CM | POA: Diagnosis not present

## 2022-07-20 DIAGNOSIS — I10 Essential (primary) hypertension: Secondary | ICD-10-CM | POA: Diagnosis present

## 2022-07-20 DIAGNOSIS — Z811 Family history of alcohol abuse and dependence: Secondary | ICD-10-CM

## 2022-07-20 DIAGNOSIS — D150 Benign neoplasm of thymus: Principal | ICD-10-CM | POA: Diagnosis present

## 2022-07-20 DIAGNOSIS — M1990 Unspecified osteoarthritis, unspecified site: Secondary | ICD-10-CM | POA: Diagnosis present

## 2022-07-20 DIAGNOSIS — J939 Pneumothorax, unspecified: Secondary | ICD-10-CM | POA: Diagnosis not present

## 2022-07-20 DIAGNOSIS — T502X5A Adverse effect of carbonic-anhydrase inhibitors, benzothiadiazides and other diuretics, initial encounter: Secondary | ICD-10-CM | POA: Diagnosis present

## 2022-07-20 DIAGNOSIS — Z833 Family history of diabetes mellitus: Secondary | ICD-10-CM | POA: Diagnosis not present

## 2022-07-20 DIAGNOSIS — Z86010 Personal history of colonic polyps: Secondary | ICD-10-CM | POA: Diagnosis not present

## 2022-07-20 DIAGNOSIS — Y838 Other surgical procedures as the cause of abnormal reaction of the patient, or of later complication, without mention of misadventure at the time of the procedure: Secondary | ICD-10-CM | POA: Diagnosis not present

## 2022-07-20 DIAGNOSIS — Z8261 Family history of arthritis: Secondary | ICD-10-CM | POA: Diagnosis not present

## 2022-07-20 DIAGNOSIS — E559 Vitamin D deficiency, unspecified: Secondary | ICD-10-CM | POA: Diagnosis present

## 2022-07-20 DIAGNOSIS — E876 Hypokalemia: Secondary | ICD-10-CM | POA: Diagnosis present

## 2022-07-20 DIAGNOSIS — Z01818 Encounter for other preprocedural examination: Secondary | ICD-10-CM | POA: Diagnosis not present

## 2022-07-20 DIAGNOSIS — R222 Localized swelling, mass and lump, trunk: Secondary | ICD-10-CM | POA: Diagnosis not present

## 2022-07-20 DIAGNOSIS — F1020 Alcohol dependence, uncomplicated: Secondary | ICD-10-CM | POA: Diagnosis present

## 2022-07-20 DIAGNOSIS — D62 Acute posthemorrhagic anemia: Secondary | ICD-10-CM | POA: Diagnosis not present

## 2022-07-20 DIAGNOSIS — D4989 Neoplasm of unspecified behavior of other specified sites: Secondary | ICD-10-CM

## 2022-07-20 HISTORY — PX: STERNOTOMY: SHX1057

## 2022-07-20 LAB — BPAM RBC
Blood Product Expiration Date: 202405292359
Blood Product Expiration Date: 202406012359
ISSUE DATE / TIME: 202405011435
ISSUE DATE / TIME: 202405011510
Unit Type and Rh: 5100

## 2022-07-20 LAB — ABO/RH: ABO/RH(D): O POS

## 2022-07-20 LAB — POCT I-STAT 7, (LYTES, BLD GAS, ICA,H+H)
Acid-base deficit: 1 mmol/L (ref 0.0–2.0)
Bicarbonate: 25.3 mmol/L (ref 20.0–28.0)
Calcium, Ion: 1.15 mmol/L (ref 1.15–1.40)
HCT: 35 % — ABNORMAL LOW (ref 39.0–52.0)
Hemoglobin: 11.9 g/dL — ABNORMAL LOW (ref 13.0–17.0)
O2 Saturation: 98 %
Patient temperature: 36.1
Potassium: 4 mmol/L (ref 3.5–5.1)
Sodium: 138 mmol/L (ref 135–145)
TCO2: 27 mmol/L (ref 22–32)
pCO2 arterial: 47.1 mmHg (ref 32–48)
pH, Arterial: 7.334 — ABNORMAL LOW (ref 7.35–7.45)
pO2, Arterial: 103 mmHg (ref 83–108)

## 2022-07-20 LAB — TYPE AND SCREEN
Antibody Screen: NEGATIVE
Unit division: 0
Unit division: 0

## 2022-07-20 LAB — PREPARE RBC (CROSSMATCH)

## 2022-07-20 SURGERY — EXCISION, MASS, MEDIASTINUM, ROBOT-ASSISTED
Anesthesia: General | Site: Chest | Laterality: Right

## 2022-07-20 MED ORDER — HYDROCHLOROTHIAZIDE 25 MG PO TABS
25.0000 mg | ORAL_TABLET | Freq: Every day | ORAL | Status: DC
  Administered 2022-07-21 – 2022-07-25 (×5): 25 mg via ORAL
  Filled 2022-07-20 (×5): qty 1

## 2022-07-20 MED ORDER — CEFAZOLIN SODIUM-DEXTROSE 2-4 GM/100ML-% IV SOLN
2.0000 g | Freq: Three times a day (TID) | INTRAVENOUS | Status: AC
  Administered 2022-07-20 – 2022-07-22 (×6): 2 g via INTRAVENOUS
  Filled 2022-07-20 (×6): qty 100

## 2022-07-20 MED ORDER — ACETAMINOPHEN 160 MG/5ML PO SOLN: 650.0000 mg | Freq: Once | ORAL | Status: DC

## 2022-07-20 MED ORDER — ALBUMIN HUMAN 5 % IV SOLN: INTRAVENOUS | Status: DC | PRN

## 2022-07-20 MED ORDER — MORPHINE SULFATE (PF) 2 MG/ML IV SOLN
1.0000 mg | INTRAVENOUS | Status: DC | PRN
  Administered 2022-07-20: 4 mg via INTRAVENOUS
  Administered 2022-07-20: 2 mg via INTRAVENOUS
  Administered 2022-07-21 (×2): 4 mg via INTRAVENOUS
  Administered 2022-07-22: 2 mg via INTRAVENOUS
  Filled 2022-07-20: qty 1
  Filled 2022-07-20 (×3): qty 2
  Filled 2022-07-20 (×2): qty 1

## 2022-07-20 MED ORDER — SODIUM CHLORIDE 0.45 % IV SOLN: INTRAVENOUS | Status: DC | PRN

## 2022-07-20 MED ORDER — LABETALOL HCL 5 MG/ML IV SOLN
10.0000 mg | Freq: Once | INTRAVENOUS | Status: AC
  Administered 2022-07-20: 10 mg via INTRAVENOUS

## 2022-07-20 MED ORDER — POTASSIUM CHLORIDE 10 MEQ/50ML IV SOLN: 10.0000 meq | INTRAVENOUS | Status: DC

## 2022-07-20 MED ORDER — HYDROMORPHONE HCL 1 MG/ML IJ SOLN
INTRAMUSCULAR | Status: AC
  Filled 2022-07-20: qty 0.5

## 2022-07-20 MED ORDER — FENTANYL CITRATE (PF) 250 MCG/5ML IJ SOLN
INTRAMUSCULAR | Status: AC
  Filled 2022-07-20: qty 5

## 2022-07-20 MED ORDER — CHLORHEXIDINE GLUCONATE 0.12 % MT SOLN
OROMUCOSAL | Status: AC
  Filled 2022-07-20: qty 15

## 2022-07-20 MED ORDER — 0.9 % SODIUM CHLORIDE (POUR BTL) OPTIME
TOPICAL | Status: DC | PRN
  Administered 2022-07-20: 2000 mL

## 2022-07-20 MED ORDER — PHENYLEPHRINE HCL-NACL 20-0.9 MG/250ML-% IV SOLN
INTRAVENOUS | Status: DC | PRN
  Administered 2022-07-20: 5 ug/min via INTRAVENOUS

## 2022-07-20 MED ORDER — BUPIVACAINE HCL (PF) 0.5 % IJ SOLN
INTRAMUSCULAR | Status: AC
  Filled 2022-07-20: qty 30

## 2022-07-20 MED ORDER — DEXMEDETOMIDINE HCL IN NACL 80 MCG/20ML IV SOLN
INTRAVENOUS | Status: AC
  Filled 2022-07-20: qty 20

## 2022-07-20 MED ORDER — DOCUSATE SODIUM 100 MG PO CAPS
200.0000 mg | ORAL_CAPSULE | Freq: Every day | ORAL | Status: DC
  Administered 2022-07-21 – 2022-07-22 (×2): 200 mg via ORAL
  Filled 2022-07-20 (×4): qty 2

## 2022-07-20 MED ORDER — INSULIN ASPART 100 UNIT/ML IJ SOLN
INTRAMUSCULAR | Status: AC
  Filled 2022-07-20: qty 1

## 2022-07-20 MED ORDER — LACTATED RINGERS IV SOLN: INTRAVENOUS | Status: DC | PRN

## 2022-07-20 MED ORDER — ONDANSETRON HCL 4 MG/2ML IJ SOLN
INTRAMUSCULAR | Status: DC | PRN
  Administered 2022-07-20: 4 mg via INTRAVENOUS

## 2022-07-20 MED ORDER — MIDAZOLAM HCL 2 MG/2ML IJ SOLN
INTRAMUSCULAR | Status: DC | PRN
  Administered 2022-07-20: 2 mg via INTRAVENOUS

## 2022-07-20 MED ORDER — MIDAZOLAM HCL 2 MG/2ML IJ SOLN
INTRAMUSCULAR | Status: AC
  Filled 2022-07-20: qty 2

## 2022-07-20 MED ORDER — DEXAMETHASONE SODIUM PHOSPHATE 10 MG/ML IJ SOLN
INTRAMUSCULAR | Status: DC | PRN
  Administered 2022-07-20: 8 mg via INTRAVENOUS

## 2022-07-20 MED ORDER — AMLODIPINE BESYLATE 10 MG PO TABS
10.0000 mg | ORAL_TABLET | Freq: Every day | ORAL | Status: DC
  Administered 2022-07-21 – 2022-07-25 (×5): 10 mg via ORAL
  Filled 2022-07-20 (×5): qty 1

## 2022-07-20 MED ORDER — DEXMEDETOMIDINE HCL IN NACL 400 MCG/100ML IV SOLN: 0.0000 ug/kg/h | INTRAVENOUS | Status: DC

## 2022-07-20 MED ORDER — LACTATED RINGERS IV SOLN: INTRAVENOUS | Status: DC

## 2022-07-20 MED ORDER — CEFAZOLIN SODIUM-DEXTROSE 2-4 GM/100ML-% IV SOLN
INTRAVENOUS | Status: AC
  Filled 2022-07-20: qty 100

## 2022-07-20 MED ORDER — ACETAMINOPHEN 500 MG PO TABS
ORAL_TABLET | ORAL | Status: AC
  Filled 2022-07-20: qty 2

## 2022-07-20 MED ORDER — CHLORHEXIDINE GLUCONATE 0.12 % MT SOLN: 15.0000 mL | OROMUCOSAL | Status: DC

## 2022-07-20 MED ORDER — SODIUM CHLORIDE 0.9 % IV SOLN: 250.0000 mL | INTRAVENOUS | Status: DC

## 2022-07-20 MED ORDER — PROPOFOL 10 MG/ML IV BOLUS
INTRAVENOUS | Status: DC | PRN
  Administered 2022-07-20: 200 mg via INTRAVENOUS

## 2022-07-20 MED ORDER — PHENYLEPHRINE HCL-NACL 20-0.9 MG/250ML-% IV SOLN: 0.0000 ug/min | INTRAVENOUS | Status: DC

## 2022-07-20 MED ORDER — GABAPENTIN 300 MG PO CAPS
ORAL_CAPSULE | ORAL | Status: AC
  Filled 2022-07-20: qty 1

## 2022-07-20 MED ORDER — SODIUM CHLORIDE 0.9 % IV SOLN: INTRAVENOUS | Status: DC | PRN

## 2022-07-20 MED ORDER — FENTANYL CITRATE (PF) 100 MCG/2ML IJ SOLN
25.0000 ug | INTRAMUSCULAR | Status: DC | PRN
  Administered 2022-07-20 (×2): 25 ug via INTRAVENOUS

## 2022-07-20 MED ORDER — SODIUM CHLORIDE 0.9% FLUSH: 3.0000 mL | INTRAVENOUS | Status: DC | PRN

## 2022-07-20 MED ORDER — ALBUMIN HUMAN 5 % IV SOLN: 250.0000 mL | INTRAVENOUS | Status: DC | PRN

## 2022-07-20 MED ORDER — ACETAMINOPHEN 500 MG PO TABS
1000.0000 mg | ORAL_TABLET | Freq: Four times a day (QID) | ORAL | Status: DC
  Administered 2022-07-20 – 2022-07-24 (×13): 1000 mg via ORAL
  Filled 2022-07-20 (×14): qty 2

## 2022-07-20 MED ORDER — DEXMEDETOMIDINE HCL IN NACL 80 MCG/20ML IV SOLN
INTRAVENOUS | Status: DC | PRN
  Administered 2022-07-20: 12 ug via INTRAVENOUS

## 2022-07-20 MED ORDER — NICARDIPINE HCL IN NACL 20-0.86 MG/200ML-% IV SOLN: 3.0000 mg/h | INTRAVENOUS | Status: DC

## 2022-07-20 MED ORDER — CEFAZOLIN SODIUM-DEXTROSE 2-4 GM/100ML-% IV SOLN
2.0000 g | INTRAVENOUS | Status: AC
  Administered 2022-07-20: 2 g via INTRAVENOUS
  Filled 2022-07-20: qty 100

## 2022-07-20 MED ORDER — FENTANYL CITRATE (PF) 100 MCG/2ML IJ SOLN
INTRAMUSCULAR | Status: AC
  Filled 2022-07-20: qty 2

## 2022-07-20 MED ORDER — ACETAMINOPHEN 500 MG PO TABS: 1000.0000 mg | ORAL_TABLET | Freq: Once | ORAL | Status: AC

## 2022-07-20 MED ORDER — SUGAMMADEX SODIUM 200 MG/2ML IV SOLN
INTRAVENOUS | Status: DC | PRN
  Administered 2022-07-20 (×2): 100 mg via INTRAVENOUS

## 2022-07-20 MED ORDER — CHLORHEXIDINE GLUCONATE 0.12 % MT SOLN: 15.0000 mL | Freq: Once | OROMUCOSAL | Status: AC

## 2022-07-20 MED ORDER — VANCOMYCIN HCL IN DEXTROSE 1-5 GM/200ML-% IV SOLN
1000.0000 mg | Freq: Once | INTRAVENOUS | Status: AC
  Administered 2022-07-20: 1000 mg via INTRAVENOUS
  Filled 2022-07-20: qty 200

## 2022-07-20 MED ORDER — BUPIVACAINE LIPOSOME 1.3 % IJ SUSP: INTRAMUSCULAR | Status: DC | PRN

## 2022-07-20 MED ORDER — FENTANYL CITRATE (PF) 250 MCG/5ML IJ SOLN
INTRAMUSCULAR | Status: DC | PRN
  Administered 2022-07-20: 100 ug via INTRAVENOUS
  Administered 2022-07-20: 50 ug via INTRAVENOUS
  Administered 2022-07-20 (×2): 100 ug via INTRAVENOUS
  Administered 2022-07-20 (×2): 25 ug via INTRAVENOUS
  Administered 2022-07-20 (×2): 50 ug via INTRAVENOUS

## 2022-07-20 MED ORDER — KETAMINE HCL 50 MG/5ML IJ SOSY
PREFILLED_SYRINGE | INTRAMUSCULAR | Status: AC
  Filled 2022-07-20: qty 5

## 2022-07-20 MED ORDER — ACETAMINOPHEN 160 MG/5ML PO SOLN: 1000.0000 mg | Freq: Four times a day (QID) | ORAL | Status: DC

## 2022-07-20 MED ORDER — KETAMINE HCL 10 MG/ML IJ SOLN
INTRAMUSCULAR | Status: DC | PRN
  Administered 2022-07-20: 30 mg via INTRAVENOUS
  Administered 2022-07-20: 10 mg via INTRAVENOUS
  Administered 2022-07-20: 20 mg via INTRAVENOUS

## 2022-07-20 MED ORDER — PANTOPRAZOLE SODIUM 40 MG PO TBEC
40.0000 mg | DELAYED_RELEASE_TABLET | Freq: Every day | ORAL | Status: DC
  Administered 2022-07-22 – 2022-07-25 (×4): 40 mg via ORAL
  Filled 2022-07-20 (×4): qty 1

## 2022-07-20 MED ORDER — BISACODYL 10 MG RE SUPP
10.0000 mg | Freq: Every day | RECTAL | Status: DC
  Filled 2022-07-20: qty 1

## 2022-07-20 MED ORDER — ACETAMINOPHEN 500 MG PO TABS
ORAL_TABLET | ORAL | Status: AC
  Administered 2022-07-20: 1000 mg via ORAL
  Filled 2022-07-20: qty 2

## 2022-07-20 MED ORDER — LABETALOL HCL 5 MG/ML IV SOLN
INTRAVENOUS | Status: AC
  Filled 2022-07-20: qty 4

## 2022-07-20 MED ORDER — SODIUM CHLORIDE 0.9% FLUSH
3.0000 mL | Freq: Two times a day (BID) | INTRAVENOUS | Status: DC
  Administered 2022-07-21 – 2022-07-23 (×3): 3 mL via INTRAVENOUS

## 2022-07-20 MED ORDER — LIDOCAINE 2% (20 MG/ML) 5 ML SYRINGE
INTRAMUSCULAR | Status: DC | PRN
  Administered 2022-07-20: 60 mg via INTRAVENOUS

## 2022-07-20 MED ORDER — FAMOTIDINE IN NACL 20-0.9 MG/50ML-% IV SOLN
20.0000 mg | Freq: Two times a day (BID) | INTRAVENOUS | Status: AC
  Administered 2022-07-20 – 2022-07-21 (×2): 20 mg via INTRAVENOUS
  Filled 2022-07-20 (×2): qty 50

## 2022-07-20 MED ORDER — ACETAMINOPHEN 650 MG RE SUPP: 650.0000 mg | Freq: Once | RECTAL | Status: DC

## 2022-07-20 MED ORDER — ROCURONIUM BROMIDE 10 MG/ML (PF) SYRINGE
PREFILLED_SYRINGE | INTRAVENOUS | Status: DC | PRN
  Administered 2022-07-20: 20 mg via INTRAVENOUS
  Administered 2022-07-20: 60 mg via INTRAVENOUS
  Administered 2022-07-20 (×4): 20 mg via INTRAVENOUS

## 2022-07-20 MED ORDER — ORAL CARE MOUTH RINSE: 15.0000 mL | Freq: Once | OROMUCOSAL | Status: AC

## 2022-07-20 MED ORDER — MAGNESIUM SULFATE 4 GM/100ML IV SOLN: 4.0000 g | Freq: Once | INTRAVENOUS | Status: DC

## 2022-07-20 MED ORDER — CLEVIDIPINE BUTYRATE 0.5 MG/ML IV EMUL
INTRAVENOUS | Status: AC
  Filled 2022-07-20: qty 50

## 2022-07-20 MED ORDER — LACTATED RINGERS IV SOLN: 500.0000 mL | Freq: Once | INTRAVENOUS | Status: DC | PRN

## 2022-07-20 MED ORDER — CLEVIDIPINE BUTYRATE 0.5 MG/ML IV EMUL
0.0000 mg/h | INTRAVENOUS | Status: DC
  Administered 2022-07-20: 4 mg/h via INTRAVENOUS
  Administered 2022-07-20: 2 mg/h via INTRAVENOUS

## 2022-07-20 MED ORDER — HYDROMORPHONE HCL 1 MG/ML IJ SOLN
INTRAMUSCULAR | Status: DC | PRN
  Administered 2022-07-20: .5 mg via INTRAVENOUS

## 2022-07-20 MED ORDER — HEMOSTATIC AGENTS (NO CHARGE) OPTIME
TOPICAL | Status: DC | PRN
  Administered 2022-07-20 (×3): 1 via TOPICAL

## 2022-07-20 MED ORDER — MIDAZOLAM HCL 2 MG/2ML IJ SOLN: 2.0000 mg | INTRAMUSCULAR | Status: DC | PRN

## 2022-07-20 MED ORDER — CHLORHEXIDINE GLUCONATE CLOTH 2 % EX PADS
6.0000 | MEDICATED_PAD | Freq: Every day | CUTANEOUS | Status: DC
  Administered 2022-07-20 – 2022-07-25 (×7): 6 via TOPICAL

## 2022-07-20 MED ORDER — PROPOFOL 10 MG/ML IV BOLUS
INTRAVENOUS | Status: AC
  Filled 2022-07-20: qty 20

## 2022-07-20 MED ORDER — AMISULPRIDE (ANTIEMETIC) 5 MG/2ML IV SOLN: 10.0000 mg | Freq: Once | INTRAVENOUS | Status: DC | PRN

## 2022-07-20 MED ORDER — ONDANSETRON HCL 4 MG/2ML IJ SOLN
4.0000 mg | Freq: Four times a day (QID) | INTRAMUSCULAR | Status: DC | PRN
  Filled 2022-07-20: qty 2

## 2022-07-20 MED ORDER — CHLORHEXIDINE GLUCONATE 0.12 % MT SOLN
OROMUCOSAL | Status: AC
  Administered 2022-07-20: 15 mL via OROMUCOSAL
  Filled 2022-07-20: qty 15

## 2022-07-20 MED ORDER — NITROGLYCERIN IN D5W 200-5 MCG/ML-% IV SOLN
0.0000 ug/min | INTRAVENOUS | Status: DC
  Administered 2022-07-20: 10 ug/min via INTRAVENOUS
  Administered 2022-07-21: 55 ug/min via INTRAVENOUS
  Administered 2022-07-21: 35 ug/min via INTRAVENOUS
  Administered 2022-07-21: 80 ug/min via INTRAVENOUS
  Filled 2022-07-20 (×3): qty 250

## 2022-07-20 MED ORDER — BUPIVACAINE LIPOSOME 1.3 % IJ SUSP
INTRAMUSCULAR | Status: AC
  Filled 2022-07-20: qty 20

## 2022-07-20 MED ORDER — OXYCODONE HCL 5 MG PO TABS
5.0000 mg | ORAL_TABLET | ORAL | Status: DC | PRN
  Administered 2022-07-20 – 2022-07-21 (×6): 10 mg via ORAL
  Administered 2022-07-22: 5 mg via ORAL
  Filled 2022-07-20 (×2): qty 2
  Filled 2022-07-20: qty 1
  Filled 2022-07-20 (×4): qty 2

## 2022-07-20 MED ORDER — BISACODYL 5 MG PO TBEC
10.0000 mg | DELAYED_RELEASE_TABLET | Freq: Every day | ORAL | Status: DC
  Administered 2022-07-21 – 2022-07-22 (×2): 10 mg via ORAL
  Filled 2022-07-20 (×4): qty 2

## 2022-07-20 MED ORDER — ORAL CARE MOUTH RINSE: 15.0000 mL | OROMUCOSAL | Status: DC | PRN

## 2022-07-20 SURGICAL SUPPLY — 83 items
ADH SKN CLS APL DERMABOND .7 (GAUZE/BANDAGES/DRESSINGS) ×4
APL PRP STRL LF DISP 70% ISPRP (MISCELLANEOUS) ×2
BLADE STERNUM SYSTEM 6 (BLADE) ×2 IMPLANT
CHLORAPREP W/TINT 26 (MISCELLANEOUS) ×2 IMPLANT
CLIP TI MEDIUM 6 (CLIP) IMPLANT
CLIP TI WIDE RED SMALL 6 (CLIP) IMPLANT
CNTNR URN SCR LID CUP LEK RST (MISCELLANEOUS) ×8 IMPLANT
CONN Y 3/8X3/8X3/8  BEN (MISCELLANEOUS) ×2
CONN Y 3/8X3/8X3/8 BEN (MISCELLANEOUS) IMPLANT
CONNECTOR BLAKE 2:1 CARIO BLK (MISCELLANEOUS) IMPLANT
CONT SPEC 4OZ STRL OR WHT (MISCELLANEOUS) ×8
DEFOGGER SCOPE WARMER CLEARIFY (MISCELLANEOUS) ×2 IMPLANT
DERMABOND ADVANCED .7 DNX12 (GAUZE/BANDAGES/DRESSINGS) ×2 IMPLANT
DRAIN CHANNEL 19F RND (DRAIN) IMPLANT
DRAIN CONNECTOR BLAKE 1:1 (MISCELLANEOUS) IMPLANT
DRAPE ARM DVNC X/XI (DISPOSABLE) ×8 IMPLANT
DRAPE COLUMN DVNC XI (DISPOSABLE) ×2 IMPLANT
DRAPE CV SPLIT W-CLR ANES SCRN (DRAPES) ×2 IMPLANT
DRAPE ORTHO SPLIT 77X108 STRL (DRAPES) ×2
DRAPE SURG ORHT 6 SPLT 77X108 (DRAPES) ×2 IMPLANT
DRSG AQUACEL AG ADV 3.5X10 (GAUZE/BANDAGES/DRESSINGS) IMPLANT
ELECT BLADE 4.0 EZ CLEAN MEGAD (MISCELLANEOUS) ×2
ELECT BLADE 6.5 EXT (BLADE) IMPLANT
ELECT REM PT RETURN 9FT ADLT (ELECTROSURGICAL) ×2
ELECTRODE BLDE 4.0 EZ CLN MEGD (MISCELLANEOUS) IMPLANT
ELECTRODE REM PT RTRN 9FT ADLT (ELECTROSURGICAL) ×2 IMPLANT
FELT TEFLON 1X6 (MISCELLANEOUS) IMPLANT
FORCEPS BPLR LNG DVNC XI (INSTRUMENTS) IMPLANT
FORCEPS CADIERE DVNC XI (FORCEP) IMPLANT
GAUZE 4X4 16PLY ~~LOC~~+RFID DBL (SPONGE) IMPLANT
GAUZE KITTNER 4X5 RF (MISCELLANEOUS) ×6 IMPLANT
GAUZE SPONGE 4X4 12PLY STRL (GAUZE/BANDAGES/DRESSINGS) IMPLANT
GLOVE BIO SURGEON STRL SZ7.5 (GLOVE) ×6 IMPLANT
GLOVE BIOGEL PI IND STRL 7.0 (GLOVE) IMPLANT
GOWN STRL REUS W/ TWL LRG LVL3 (GOWN DISPOSABLE) ×2 IMPLANT
GOWN STRL REUS W/ TWL XL LVL3 (GOWN DISPOSABLE) ×4 IMPLANT
GOWN STRL REUS W/TWL 2XL LVL3 (GOWN DISPOSABLE) ×2 IMPLANT
GOWN STRL REUS W/TWL LRG LVL3 (GOWN DISPOSABLE) ×2
GOWN STRL REUS W/TWL XL LVL3 (GOWN DISPOSABLE) ×4
GRASPER TIP-UP FEN DVNC XI (INSTRUMENTS) IMPLANT
HEMOSTAT POWDER SURGIFOAM 1G (HEMOSTASIS) IMPLANT
HEMOSTAT SURGICEL 2X14 (HEMOSTASIS) ×2 IMPLANT
INSERT SUTURE HOLDER (MISCELLANEOUS) IMPLANT
IRRIGATOR SUCT 8 DISP DVNC XI (IRRIGATION / IRRIGATOR) IMPLANT
KIT SUCTION CATH 14FR (SUCTIONS) IMPLANT
NDL HYPO 22X1.5 SAFETY MO (MISCELLANEOUS) IMPLANT
NEEDLE HYPO 22GX1.5 SAFETY (NEEDLE) ×2 IMPLANT
NEEDLE HYPO 22X1.5 SAFETY MO (MISCELLANEOUS) ×2 IMPLANT
PACK CHEST (CUSTOM PROCEDURE TRAY) ×2 IMPLANT
PAD ARMBOARD 7.5X6 YLW CONV (MISCELLANEOUS) ×4 IMPLANT
PAD ELECT DEFIB RADIOL ZOLL (MISCELLANEOUS) ×2 IMPLANT
SEAL CANN UNIV 5-8 DVNC XI (MISCELLANEOUS) ×8 IMPLANT
SET TRI-LUMEN FLTR TB AIRSEAL (TUBING) ×2 IMPLANT
SOL ELECTROSURG ANTI STICK (MISCELLANEOUS) ×2
SOLUTION ELECTROSURG ANTI STCK (MISCELLANEOUS) ×2 IMPLANT
SPONGE T-LAP 18X18 ~~LOC~~+RFID (SPONGE) IMPLANT
SPONGE TONSIL 1 RF SGL (DISPOSABLE) ×2 IMPLANT
SUT MNCRL AB 3-0 PS2 18 (SUTURE) IMPLANT
SUT PDS AB 1 CTX 36 (SUTURE) IMPLANT
SUT PROLENE 4 0 RB 1 (SUTURE) ×6
SUT PROLENE 4 0 SH DA (SUTURE) IMPLANT
SUT PROLENE 4-0 RB1 .5 CRCL 36 (SUTURE) IMPLANT
SUT SILK  1 MH (SUTURE) ×6
SUT SILK 1 MH (SUTURE) ×2 IMPLANT
SUT SILK 2 0 SH CR/8 (SUTURE) IMPLANT
SUT STEEL 6MS V (SUTURE) IMPLANT
SUT STEEL SZ 6 DBL 3X14 BALL (SUTURE) IMPLANT
SUT VIC AB 2-0 CT1 27 (SUTURE) ×2
SUT VIC AB 2-0 CT1 TAPERPNT 27 (SUTURE) IMPLANT
SUT VIC AB 2-0 CTX 36 (SUTURE) IMPLANT
SUT VIC AB 3-0 SH 27 (SUTURE) ×4
SUT VIC AB 3-0 SH 27X BRD (SUTURE) ×4 IMPLANT
SUT VICRYL 0 TIES 12 18 (SUTURE) ×2 IMPLANT
SUT VICRYL 0 UR6 27IN ABS (SUTURE) ×4 IMPLANT
SYR 20CC LL (SYRINGE) ×2 IMPLANT
SYSTEM SAHARA CHEST DRAIN ATS (WOUND CARE) IMPLANT
TAPE CLOTH SURG 4X10 WHT LF (GAUZE/BANDAGES/DRESSINGS) IMPLANT
TOWEL GREEN STERILE (TOWEL DISPOSABLE) IMPLANT
TOWEL GREEN STERILE FF (TOWEL DISPOSABLE) IMPLANT
TRAY FOLEY SLVR 16FR TEMP STAT (SET/KITS/TRAYS/PACK) IMPLANT
TROCAR PORT AIRSEAL 12X150 (TUBING) IMPLANT
TROCAR PORT AIRSEAL 8X120 (TROCAR) IMPLANT
TUBING ANTICOAG CELL SAVER (IV SETS) IMPLANT

## 2022-07-20 NOTE — Anesthesia Preprocedure Evaluation (Signed)
Anesthesia Evaluation  Patient identified by MRN, date of birth, ID band Patient awake    Reviewed: Allergy & Precautions, NPO status , Patient's Chart, lab work & pertinent test results  Airway Mallampati: II  TM Distance: >3 FB Neck ROM: Full    Dental  (+) Dental Advisory Given   Pulmonary neg pulmonary ROS   breath sounds clear to auscultation       Cardiovascular hypertension, Pt. on medications  Rhythm:Regular Rate:Normal     Neuro/Psych  Headaches    GI/Hepatic Neg liver ROS,GERD  ,,  Endo/Other  negative endocrine ROS    Renal/GU negative Renal ROS     Musculoskeletal  (+) Arthritis ,    Abdominal   Peds  Hematology negative hematology ROS (+)   Anesthesia Other Findings   Reproductive/Obstetrics                             Anesthesia Physical Anesthesia Plan  ASA: 2  Anesthesia Plan: General   Post-op Pain Management: Tylenol PO (pre-op)*   Induction: Intravenous  PONV Risk Score and Plan: 2 and Dexamethasone, Ondansetron, Midazolam and Treatment may vary due to age or medical condition  Airway Management Planned: Oral ETT  Additional Equipment: ClearSight  Intra-op Plan:   Post-operative Plan: Extubation in OR  Informed Consent: I have reviewed the patients History and Physical, chart, labs and discussed the procedure including the risks, benefits and alternatives for the proposed anesthesia with the patient or authorized representative who has indicated his/her understanding and acceptance.     Dental advisory given  Plan Discussed with: CRNA  Anesthesia Plan Comments:        Anesthesia Quick Evaluation

## 2022-07-20 NOTE — Interval H&P Note (Signed)
History and Physical Interval Note:  07/20/2022 12:14 PM  Corey Thomas  has presented today for surgery, with the diagnosis of MEDIASTINAL MASS.  The various methods of treatment have been discussed with the patient and family. After consideration of risks, benefits and other options for treatment, the patient has consented to  Procedure(s): XI ROBOTIC ASSISTED RESECTION OF MEDIASTINAL MASS (Right) as a surgical intervention.  The patient's history has been reviewed, patient examined, no change in status, stable for surgery.  I have reviewed the patient's chart and labs.  Questions were answered to the patient's satisfaction.     Erlin Gardella Keane Scrape

## 2022-07-20 NOTE — Anesthesia Procedure Notes (Signed)
Arterial Line Insertion Start/End5/03/2022 2:44 PM, 07/20/2022 2:49 PM Performed by: Marcene Duos, MD, anesthesiologist  Patient location: Pre-op. Preanesthetic checklist: patient identified, IV checked, site marked, risks and benefits discussed, surgical consent, monitors and equipment checked, pre-op evaluation, timeout performed and anesthesia consent Lidocaine 1% used for infiltration Left, radial was placed Catheter size: 20 G Hand hygiene performed  and maximum sterile barriers used   Attempts: 2 Procedure performed using ultrasound guided technique. Ultrasound Notes:anatomy identified, needle tip was noted to be adjacent to the nerve/plexus identified, no ultrasound evidence of intravascular and/or intraneural injection and image(s) printed for medical record Following insertion, dressing applied and Biopatch. Post procedure assessment: normal and unchanged  Patient tolerated the procedure well with no immediate complications.

## 2022-07-20 NOTE — Op Note (Signed)
      [address]       [place] [address]             [phone]        07/20/2022  Patient:  Erich Montane Pre-Op Dx: Mediastinal mass   Post-op Dx:  same Procedure: - Robotic assisted right video thoracoscopy - Conversion to sternotomy - Resection of anterior mediastinal mass - Intercostal nerve block  Surgeon and Role:      * Molly Maselli, Eliezer Lofts, MD - Primary    Jacques Earthly, PA-C - assisting  Anesthesia  general EBL:  300 ml Blood Administration: none Specimen:  mediastinal mass.  Level 5 lymph node.  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  I will order tumor markers and antibodies for completion sake.  We discussed the risks and benefits of a R RATS, and resection of anterior mediastinal mass.  He is agreeable to proceed.   Findings: Large mediastinal mass.  Densely adherent to the pericardium, and innominate vein.  Bleeding was encountered on the left side of the vein.  Pressure was held for 10 minutes without success.  We converted to a sternotomy, and controlled the bleeding with 4-0 pledget proline.  The patient remains hemodynamically stable throughout the case.    Operative Technique: After the risks, benefits and alternatives were thoroughly discussed, the patient was brought to the operative theatre.  Anesthesia was induced, and the patient was then placed in a left lazy lateral decubitus position and was prepped and draped in normal sterile fashion.  An appropriate surgical pause was performed, and pre-operative antibiotics were dosed accordingly.  We began by placing our 3 robotic ports in the the intercostal spaces targeting the pericardium.  A 5mm assistant port was placed in the 9th intercostal space in the posterior axillary line.  The robot was then docked and all instruments were passed under direct visualization.  The mass was  evident anterior to the pericardium.  We began with a lateral to medial dissection taking care not to injure the phrenic nerve.  The dissection was carried posterior to the mass along the pericardium.  The left  pleural space was entered.  We then continued to dissect all mediastinal subcutaneous tissue off the sternum.  We then continued our dissection into the neck and brought down the thymic horns.  The innominate vein was identified, and the tissue was carefully dissected away from it.  An injury to vein was encountered while dissecting the mass.  We attempted to hold pressure for 10 minutes without success.  At this point, I elected to convert to a sternotomy.  Pressure was held through the assistant port.  All robotic instruments were removed, and the robot was undocked.  A sternotomy was performed in standard fashion, and the injury was identified and controlled with 4-0 pledgetted proline suture.  The patient remained hemodynamically stable throughout the conversion.  Meticulous hemostasis was obtained.   The mass resection was then completed.  AP window lymph nodes were sampled.  Chest tubes were inserted.  Sternal wires were placed, and the skin and soft tissue were closed with absorbable suture.     The patient tolerated the procedure without any immediate complications, and was transferred to the PACU in stable condition.  Imani Fiebelkorn Keane Scrape

## 2022-07-20 NOTE — Anesthesia Procedure Notes (Signed)
Procedure Name: Intubation Date/Time: 07/20/2022 12:56 PM  Performed by: Jodell Cipro, CRNAPre-anesthesia Checklist: Patient identified, Emergency Drugs available, Suction available and Patient being monitored Patient Re-evaluated:Patient Re-evaluated prior to induction Oxygen Delivery Method: Circle System Utilized Preoxygenation: Pre-oxygenation with 100% oxygen Induction Type: IV induction Ventilation: Mask ventilation without difficulty Laryngoscope Size: Mac and 4 Grade View: Grade II Tube type: Oral Endobronchial tube: Left, EBT position confirmed by fiberoptic bronchoscope and EBT position confirmed by auscultation and 39 Fr Number of attempts: 1 Airway Equipment and Method: Stylet and Oral airway Placement Confirmation: ETT inserted through vocal cords under direct vision, positive ETCO2 and breath sounds checked- equal and bilateral Secured at: 29 cm Tube secured with: Tape Dental Injury: Teeth and Oropharynx as per pre-operative assessment

## 2022-07-20 NOTE — Discharge Summary (Signed)
[address]       [place] [address]             [phone]    Physician Discharge Summary  Patient ID: Corey Thomas MRN: [id number] DOB/AGE: [birth year] 57 y.o.  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   Rectal bleeding 01/24/2022   Blood in stool 07/28/2021   New onset of headaches 05/14/2021   Screening due 03/03/2020   Overweight (BMI 25.0-29.9) 04/16/2019   Erectile dysfunction 03/12/2019   Primary osteoarthritis of right knee 02/21/2017   Primary insomnia 02/21/2017   Tubular adenoma of colon 11/14/2016   HLD (hyperlipidemia) 07/15/2016   Alcohol cessation counseling 07/04/2016   Noncompliance with medications 06/08/2016   Tobacco dipper 06/08/2016   GERD without esophagitis 06/08/2016   Monocular visual disturbance 06/08/2016   Essential hypertension 10/14/2013     Discharge Diagnoses:  Patient Active Problem List   Diagnosis Date Noted   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   Rectal bleeding 01/24/2022   Blood in stool 07/28/2021   New onset of headaches 05/14/2021   Screening due 03/03/2020   Overweight (BMI 25.0-29.9) 04/16/2019   Erectile dysfunction 03/12/2019   Primary osteoarthritis of right knee 02/21/2017   Primary insomnia 02/21/2017   Tubular adenoma of colon 11/14/2016   HLD (hyperlipidemia) 07/15/2016   Alcohol cessation counseling 07/04/2016   Noncompliance with medications 06/08/2016   Tobacco dipper 06/08/2016   GERD without esophagitis 06/08/2016   Monocular visual disturbance 06/08/2016   Essential hypertension 10/14/2013     Discharged Condition: Stable  HPI: Mr. Corey Thomas is a 57 y.o. 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 This was found incidentally. He has undergone an MRI. He does complain of some  chest tenderness at 4:00 in the morning every day, but this usually resolves within an hour. Dr. Cliffton Asters discussed the need for robotic assisted resection of anterior mediastinal mass. Potential risks, complications, and benefits of the surgery were discussed with the patient and Mr. Corey Thomas agreed to proceed with surgery.  Hospital Course: Mr. Corey Thomas presented to [facility] and was taken to the operating room on 07/20/22. He underwent a robotic assisted right video thoracoscopy and resection of anterior mediastinal mass with conversion to median sternotomy. He tolerated the procedure well and was transferred to the SICU in stable condition. Drips were weaned as hemodynamics tolerated and he was started on home HCTZ and Norvasc. Arterial line was removed without complication. He had a history of alcoholism. Thiamine, MVI, and beer was ordered to avoid withdrawal symptoms. Chest tubes and foley were removed early in his post operative course. Benazepril was restarted for better BP control on 05/03. He had mild expected post op blood loss anemia. He did not require a post op transfusion. He was stable for transfer from the ICU to 4E for further convalescence on 05/03. He initially required 2 liters of oxygen via [place] but was later weaned to room air. Hypertension persisted, his benazepril was titrated to home dose. Creatinine was monitored closely and remained stable. He became hypokalemic and reports a history of this, likely due to HCTZ, potassium was supplemented. He was tolerating a diet and had a bowel movement. He was ambulating well on room air. Wounds were clean, dry, and healing without signs of infection. He was  felt surgically stable for discharge.  Consults: None  Significant Diagnostic Studies:   Narrative & Impression  CLINICAL DATA:  Pneumothorax   EXAM: PORTABLE CHEST 1 VIEW   COMPARISON:  Chest x-ray 07/18/2022   FINDINGS: Sternotomy wires are present. New mediastinal drain  is present. Bilateral chest tubes are present. Cardiomediastinal silhouette is within normal limits. There is a very small right pneumothorax measuring 3 mm from the right lung apex. There some atelectatic changes in the right lower lobe. Costophrenic angles are clear. No acute fractures.   IMPRESSION: 1. Bilateral chest tubes are present. There is a very small right pneumothorax measuring 3 mm from the right lung apex. 2. New mediastinal drain is present.     Electronically Signed   By: Darliss Cheney M.D.   On: 07/20/2022 17:16    Treatments: surgery:  - Robotic assisted right video thoracoscopy - Conversion to sternotomy - Resection of anterior mediastinal mass - Intercostal nerve block by Dr. Cliffton Asters on 07/20/2022.  Pathology: Pending  Discharge Exam: Blood pressure 115/74, pulse 85, temperature 98.6 F (37 C), temperature source Oral, resp. rate (!) 21, height 6' (1.829 m), weight 84.5 kg, SpO2 98 %. General appearance: alert, cooperative, and no distress Neurologic: intact Heart: regular rate and rhythm, no murmur Lungs: clear to auscultation bilaterally Abdomen: soft, non-tender; bowel sounds normal; no masses,  no organomegaly Extremities: extremities normal, atraumatic, no cyanosis or edema Wound: Clean and dry, no erythema or sign of infection  Discharge Medications: Allergies as of 07/25/2022       Reactions   Tramadol Other (See Comments)   Patient states "cramps"        Medication List     TAKE these medications    amLODipine 10 MG tablet Commonly known as: NORVASC Take 1 tablet (10 mg total) by mouth daily.   benazepril 40 MG tablet Commonly known as: LOTENSIN Take 1 tablet (40 mg total) by mouth daily.   hydrochlorothiazide 25 MG tablet Commonly known as: HYDRODIURIL Take 1 tablet (25 mg total) by mouth daily.   multivitamin with minerals Tabs tablet Take 1 tablet by mouth daily.   oxyCODONE 5 MG immediate release tablet Commonly known  as: Oxy IR/ROXICODONE Take 1 tablet (5 mg total) by mouth every 6 (six) hours as needed for severe pain.   pantoprazole 40 MG tablet Commonly known as: PROTONIX Take 1 tablet (40 mg total) by mouth daily. Start taking on: Jul 26, 2022   potassium chloride SA 20 MEQ tablet Commonly known as: KLOR-CON M Take 1 tablet (20 mEq total) by mouth daily.   thiamine 100 MG tablet Commonly known as: Vitamin B-1 Take 1 tablet (100 mg total) by mouth daily.        Follow-up Information     Corliss Skains, MD Follow up on 07/29/2022.   Specialty: Cardiothoracic Surgery Why: Follow up appointment in the office is at 10:00AM Contact information: [address] [place] [address] [phone]                 Signed:  Jenny Reichmann, PA-C 07/25/2022, 1:12 PM

## 2022-07-20 NOTE — Hospital Course (Addendum)
HPI: This is a 57 y.o. 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  This was found incidentally.  He has undergon an MRI.  He does complain of some chest tenderness at 4:00 in the morning every day, but this usually resolves within an hour. Dr. Cliffton Asters discussed the need for robotic assisted resection of anterior mediastinal mass. Potential risks, complications, and benefits of the surgery were discussed with the patient and he agreed to proceed with surgery.  Hospital Course: Patient underwent a robotic assisted right video thoracoscopy, resection of anterior mediastinal mass with conversion to median sternotomy. He tolerated the procedure well and was transferred to the SICU in stable condition. Drips were weaned as hemodynamics tolerated and he was started on home HCTZ and Norvasc. Arterial line was removed without complication. He had a history of alcoholism. Thiamine, MVI, and beer was ordered to avoid withdrawal symptoms. Chest tubes and foley were removed early in his post operative course. Benazepril was restarted for better BP control on 05/03.  He had mild expected post op blood loss anemia. He did not require a post op transfusion. He was stable for transfer from the ICU to 4E for further convalescence on 05/03. He initially required 2 liters of oxygen via [place] but was later weaned to room air. Hypertension persisted, his benazepril was titrated to home dose and creatinine was monitored closely. He has been tolerating a diet and has had a bowel movement. Wounds are clean, dry, healing without signs of infection. He is felt surgically stable for discharge today.

## 2022-07-20 NOTE — Brief Op Note (Signed)
07/20/2022  4:03 PM  PATIENT:  Corey Thomas  57 y.o. male  PRE-OPERATIVE DIAGNOSIS:  MEDIASTINAL MASS  POST-OPERATIVE DIAGNOSIS:  MEDIASTINAL MASS  PROCEDURE:  XI ROBOTIC ASSISTED RIGHT VIDEO THORACOSCOPY, RESECTION OF MEDIASTINAL MASS  STERNOTOMY with CONVERSION to MEDIAN STERNOTOMY  SURGEON:  Surgeon(s) and Role:    Lightfoot, Eliezer Lofts, MD - Primary  PHYSICIAN ASSISTANT: Doree Fudge PA-C  ANESTHESIA:   general  EBL:  300 mL   BLOOD ADMINISTERED: One  CC CELLSAVER  DRAINS:  3 19 Blake drains  placed in the mediastinal and pleural spaces  LOCAL MEDICATIONS USED:  OTHER Exparel  SPECIMEN:  Source of Specimen:  Anterior mediastinal mass, lymph node  DISPOSITION OF SPECIMEN:  PATHOLOGY  COUNTS CORRECT:  YES  DICTATION: .Dragon Dictation  PLAN OF CARE: Admit to inpatient   PATIENT DISPOSITION:  PACU - hemodynamically stable.   Delay start of Pharmacological VTE agent (>24hrs) due to surgical blood loss or risk of bleeding: no

## 2022-07-20 NOTE — Discharge Instructions (Signed)
Robot-Assisted Thoracic Surgery, Care After The following information offers guidance on how to care for yourself after your procedure. Your health care provider may also give you more specific instructions. If you have problems or questions, contact your health care provider. What can I expect after the procedure? After the procedure, it is common to have: Some pain and aches in the area of your surgical incisions. Pain when breathing in (inhaling) and coughing. Tiredness (fatigue). Trouble sleeping. Constipation. Follow these instructions at home: Medicines Take over-the-counter and prescription medicines only as told by your health care provider. If you were prescribed an antibiotic medicine, take it as told by your health care provider. Do not stop taking the antibiotic even if you start to feel better. Talk with your health care provider about safe and effective ways to manage pain after your procedure. Pain management should fit your specific health needs. Take pain medicine before pain becomes severe. Relieving and controlling your pain will make breathing easier for you. Ask your health care provider if the medicine prescribed to you requires you to avoid driving or using machinery. Eating and drinking Follow instructions from your health care provider about eating or drinking restrictions. These will vary depending on what procedure you had. Your health care provider may recommend: A liquid diet or soft diet for the first few days. Meals that are smaller and more frequent. A diet of fruits, vegetables, whole grains, and low-fat proteins. Limiting foods that are high in fat and processed sugar, including fried or sweet foods. Incision care Follow instructions from your health care provider about how to take care of your incisions. Make sure you: Wash your hands with soap and water for at least 20 seconds before and after you change your bandage (dressing). If soap and water are not  available, use hand sanitizer. Change your dressing as told by your health care provider. Leave stitches (sutures), skin glue, or adhesive strips in place. These skin closures may need to stay in place for 2 weeks or longer. If adhesive strip edges start to loosen and curl up, you may trim the loose edges. Do not remove adhesive strips completely unless your health care provider tells you to do that. Check your incision area every day for signs of infection. Check for: Redness, swelling, or more pain. Fluid or blood. Warmth. Pus or a bad smell. Activity Return to your normal activities as told by your health care provider. Ask your health care provider what activities are safe for you. Ask your health care provider when it is safe for you to drive. Do not lift anything that is heavier than 10 lb (4.5 kg), or the limit that you are told, until your health care provider says that it is safe. Rest as told by your health care provider. Avoid sitting for a long time without moving. Get up to take short walks every 1-2 hours. This is important to improve blood flow and breathing. Ask for help if you feel weak or unsteady. Do exercises as told by your health care provider. Pneumonia prevention  Do deep breathing exercises and cough regularly as directed. This helps clear mucus and opens your lungs. Doing this helps prevent lung infection (pneumonia). If you were given an incentive spirometer, use it as told. An incentive spirometer is a tool that measures how well you are filling your lungs with each breath. Coughing may hurt less if you try to support your chest. This is called splinting. Try one of these when you   cough: Hold a pillow against your chest. Place the palms of both hands on top of your incision area. Do not use any products that contain nicotine or tobacco. These products include cigarettes, chewing tobacco, and vaping devices, such as e-cigarettes. If you need help quitting, ask your  health care provider. Avoid secondhand smoke. General instructions If you have a drainage tube: Follow instructions from your health care provider about how to take care of it. Do not travel by airplane after your tube is removed until your health care provider tells you it is safe. You may need to take these actions to prevent or treat constipation: Drink enough fluid to keep your urine pale yellow. Take over-the-counter or prescription medicines. Eat foods that are high in fiber, such as beans, whole grains, and fresh fruits and vegetables. Limit foods that are high in fat and processed sugars, such as fried or sweet foods. Keep all follow-up visits. This is important. Contact a health care provider if: You have redness, swelling, or more pain around an incision. You have fluid or blood coming from an incision. An incision feels warm to the touch. You have pus or a bad smell coming from an incision. You have a fever. You cannot eat or drink without vomiting. Your pain medicine is not controlling your pain. Get help right away if: You have chest pain. Your heart is beating quickly. You have trouble breathing. You have trouble speaking. You are confused. You feel weak or dizzy, or you faint. These symptoms may represent a serious problem that is an emergency. Do not wait to see if the symptoms will go away. Get medical help right away. Call your local emergency services (911 in the [place] Do not drive yourself to the hospital. Summary Talk with your health care provider about safe and effective ways to manage pain after your procedure. Pain management should fit your specific health needs. Return to your normal activities as told by your health care provider. Ask your health care provider what activities are safe for you. Do deep breathing exercises and cough regularly as directed. This helps to clear mucus and prevent pneumonia. If it hurts to cough, ease pain by holding a pillow  against your chest or by placing the palms of both hands over your incisions. This information is not intended to replace advice given to you by your health care provider. Make sure you discuss any questions you have with your health care provider. Document Revised: 11/29/2019 Document Reviewed: 11/29/2019 Elsevier Patient Education  2023 Elsevier Inc.   

## 2022-07-20 NOTE — Transfer of Care (Signed)
Immediate Anesthesia Transfer of Care Note  Patient: Corey Thomas  Procedure(s) Performed: XI ROBOTIC ASSISTED RESECTION OF MEDIASTINAL MASS/LYMPH NODE EXCISION (Right) STERNOTOMY (Chest)  Patient Location: PACU  Anesthesia Type:General  Level of Consciousness: drowsy, patient cooperative, and responds to stimulation  Airway & Oxygen Therapy: Patient Spontanous Breathing and Patient connected to face mask oxygen  Post-op Assessment: Report given to RN and Post -op Vital signs reviewed and stable  Post vital signs: Reviewed and stable  Last Vitals:  Vitals Value Taken Time  BP 169/103 07/20/22 1630  Temp    Pulse 90 07/20/22 1641  Resp 15 07/20/22 1641  SpO2 98 % 07/20/22 1641  Vitals shown include unvalidated device data.  Last Pain:  Vitals:   07/20/22 1004  PainSc: 0-No pain      Patients Stated Pain Goal: 0 (07/20/22 1004)  Complications: No notable events documented.

## 2022-07-21 ENCOUNTER — Other Ambulatory Visit: Payer: Self-pay

## 2022-07-21 ENCOUNTER — Encounter (HOSPITAL_COMMUNITY): Payer: Self-pay | Admitting: Thoracic Surgery (Cardiothoracic Vascular Surgery)

## 2022-07-21 ENCOUNTER — Inpatient Hospital Stay (HOSPITAL_COMMUNITY): Payer: BC Managed Care – PPO

## 2022-07-21 LAB — BASIC METABOLIC PANEL
Anion gap: 9 (ref 5–15)
BUN: 8 mg/dL (ref 6–20)
CO2: 24 mmol/L (ref 22–32)
Calcium: 8 mg/dL — ABNORMAL LOW (ref 8.9–10.3)
Chloride: 97 mmol/L — ABNORMAL LOW (ref 98–111)
Creatinine, Ser: 1.19 mg/dL (ref 0.61–1.24)
GFR, Estimated: >60 mL/min (ref >60)
Glucose, Bld: 139 mg/dL — ABNORMAL HIGH (ref 70–99)
Potassium: 4.2 mmol/L (ref 3.5–5.1)
Sodium: 130 mmol/L — ABNORMAL LOW (ref 135–145)

## 2022-07-21 LAB — MAGNESIUM: Magnesium: 1.7 mg/dL (ref 1.7–2.4)

## 2022-07-21 LAB — CBC
HCT: 31.8 % — ABNORMAL LOW (ref 39.0–52.0)
Hemoglobin: 11.5 g/dL — ABNORMAL LOW (ref 13.0–17.0)
MCH: 31.6 pg (ref 26.0–34.0)
MCHC: 36.2 g/dL — ABNORMAL HIGH (ref 30.0–36.0)
MCV: 87.4 fL (ref 80.0–100.0)
Platelets: 186 10*3/uL (ref 150–400)
RBC: 3.64 MIL/uL — ABNORMAL LOW (ref 4.22–5.81)
RDW: 13.1 % (ref 11.5–15.5)
WBC: 11.2 10*3/uL — ABNORMAL HIGH (ref 4.0–10.5)
nRBC: 0 % (ref 0.0–0.2)

## 2022-07-21 MED ORDER — HYDRALAZINE HCL 20 MG/ML IJ SOLN
10.0000 mg | Freq: Four times a day (QID) | INTRAMUSCULAR | Status: DC | PRN
  Administered 2022-07-22 – 2022-07-23 (×2): 10 mg via INTRAVENOUS
  Filled 2022-07-21 (×2): qty 1

## 2022-07-21 MED ORDER — ENOXAPARIN SODIUM 40 MG/0.4ML IJ SOSY
40.0000 mg | PREFILLED_SYRINGE | Freq: Every day | INTRAMUSCULAR | Status: DC
  Administered 2022-07-21 – 2022-07-25 (×5): 40 mg via SUBCUTANEOUS
  Filled 2022-07-21 (×5): qty 0.4

## 2022-07-21 MED ORDER — SPIRITUS FRUMENTI
1.0000 | Freq: Two times a day (BID) | ORAL | Status: DC
  Filled 2022-07-21 (×10): qty 1

## 2022-07-21 NOTE — Progress Notes (Signed)
Patient ID: Corey Thomas, male   DOB: 02/22/1966, 57 y.o.   MRN: [id number]  TCTS Evening Rounds:  Hemodynamically stable today. Sats 95% 2L  UO good. CT output low.  Doing well overall. Refusing his beer that was ordered.

## 2022-07-21 NOTE — Anesthesia Postprocedure Evaluation (Signed)
Anesthesia Post Note  Patient: Corey Thomas  Procedure(s) Performed: XI ROBOTIC ASSISTED RESECTION OF MEDIASTINAL MASS/LYMPH NODE EXCISION (Right) STERNOTOMY (Chest)     Patient location during evaluation: PACU Anesthesia Type: General Level of consciousness: awake and alert Pain management: pain level controlled Vital Signs Assessment: post-procedure vital signs reviewed and stable Respiratory status: spontaneous breathing, nonlabored ventilation, respiratory function stable and patient connected to nasal cannula oxygen Cardiovascular status: blood pressure returned to baseline and stable Postop Assessment: no apparent nausea or vomiting Anesthetic complications: no   No notable events documented.  Last Vitals:  Vitals:   07/21/22 2023 07/21/22 2030  BP:  131/72  Pulse:  84  Resp:  20  Temp: 37.6 C   SpO2:  96%    Last Pain:  Vitals:   07/21/22 2023  TempSrc: Oral  PainSc:                  Kennieth Rad

## 2022-07-21 NOTE — Progress Notes (Addendum)
[address]       [place] [address]             [phone]      1 Day Post-Op Procedure(s) (LRB): XI ROBOTIC ASSISTED RESECTION OF MEDIASTINAL MASS/LYMPH NODE EXCISION (Right) STERNOTOMY (N/A) Subjective: Patient states he is doing pretty well this morning and his pain ranges from a 5-8 but will be better once the tubes are out. Sitting up in chair this AM.  Objective: Vital signs in last 24 hours: Temp:  [98 F (36.7 C)-98.5 F (36.9 C)] 98.5 F (36.9 C) (05/02 0300) Pulse Rate:  [67-97] 80 (05/02 0700) Cardiac Rhythm: Normal sinus rhythm (05/01 2000) Resp:  [12-20] 19 (05/02 0700) BP: (117-169)/(65-103) 135/85 (05/02 0700) SpO2:  [92 %-99 %] 92 % (05/02 0700) Arterial Line BP: (90-198)/(67-123) 129/123 (05/02 0700) Weight:  [91.2 kg-91.6 kg] 91.6 kg (05/02 0600)  Hemodynamic parameters for last 24 hours:    Intake/Output from previous day: 05/01 0701 - 05/02 0700 In: 4274 [P.O.:145; I.V.:3069; Blood:110; IV Piggyback:950] Out: 2425 [Urine:1805; Blood:300; Chest Tube:320] Intake/Output this shift: No intake/output data recorded.  General appearance: alert, cooperative, and no distress Neurologic: intact Heart: regular rate and rhythm, S1, S2 normal, no murmur, click, rub or gallop Lungs: clear to auscultation bilaterally Abdomen: Hypoactive bowel sounds, no tenderness, no distension Extremities: extremities normal, atraumatic, no cyanosis or edema Wound: Clean and dry dressing in place  Lab Results: Recent Labs    07/18/22 1017 07/20/22 1509 07/21/22 0408  WBC 5.7  --  11.2*  HGB 16.2 11.9* 11.5*  HCT 44.3 35.0* 31.8*  PLT 235  --  186   BMET:  Recent Labs    07/18/22 1017 07/20/22 1509 07/21/22 0408  NA 137 138 130*  K 3.9 4.0 4.2  CL 98  --  97*  CO2 27  --  24  GLUCOSE 103*  --  139*  BUN 14  --  8  CREATININE 1.20  --  1.19  CALCIUM 9.4  --  8.0*    PT/INR:  Recent Labs    07/18/22 1017  LABPROT 13.6  INR 1.1    ABG    Component Value Date/Time   PHART 7.334 (L) 07/20/2022 1509   HCO3 25.3 07/20/2022 1509   TCO2 27 07/20/2022 1509   ACIDBASEDEF 1.0 07/20/2022 1509   O2SAT 98 07/20/2022 1509   CBG (last 3)  No results for input(s): "GLUCAP" in the last 72 hours.  Assessment/Plan: S/P Procedure(s) (LRB): XI ROBOTIC ASSISTED RESECTION OF MEDIASTINAL MASS/LYMPH NODE EXCISION (Right) STERNOTOMY (N/A)  Neuro: Some pain this AM, continue Tylenol, Oxycodone and Morphine.   CV: Vital signs stable. NSR. SBP 130s, on of Nitro. Start home HCTZ and Norvasc for HTN. Wean nitro as able. D/c arterial line once off drips.   Pulm: Saturating well on room air this AM. CT output 320cc/24hrs. CT to suction this AM. No air leak seen with cough. Tiny right apical pneumothorax looks resolved on CXR. Leave CT in place for today. Encourage IS and ambulation.   GI: Large gastric bubble on CXR and hypoactive bowel sounds. No abdominal discomfort, nausea or vomiting. Advance diet as tolerated.   Renal: Good UO 1805cc/24hrs. Cr 1.19.   ID: Leukocytosis likely reactive, WBC 11.2. Afebrile. Will monitor.   Expected postop ABLA: H/H 11.5/31.8  DVT Prophylaxis: H/H ok, start Lovenox.  Hx of alcohol abuse: Pt drinks 6 drinks per night. Monitor closely for withdrawal.  Dispo:  Wean nitro and d/c arterial line. Likely transfer to progressive unit today.    LOS: 1 day    Corey Reichmann, PA-C 07/21/2022  Agree with above Titrating BP meds Pain control Will keep tubes Adding beer for hx  Corey Thomas

## 2022-07-21 NOTE — TOC CM/SW Note (Signed)
.   Transition of Care [facility]) Screening Note   Patient Details  Name: Corey Thomas Date of Birth: 30-Oct-1965   [facility]) CM/SW Contact:    Elliot Cousin, RN Phone Number: [phone] 07/21/2022, 2:09 PM    Transition of Care Department [facility]) has reviewed patient. We will continue to monitor patient advancement through interdisciplinary progression rounds. Will continue to follow for dc needs.

## 2022-07-22 LAB — T4: T4, Total: 6.6 ug/dL (ref 4.5–12.0)

## 2022-07-22 MED ORDER — THIAMINE MONONITRATE 100 MG PO TABS
100.0000 mg | ORAL_TABLET | Freq: Every day | ORAL | Status: DC
  Administered 2022-07-22 – 2022-07-25 (×4): 100 mg via ORAL
  Filled 2022-07-22 (×4): qty 1

## 2022-07-22 MED ORDER — SODIUM CHLORIDE 0.9% FLUSH: 3.0000 mL | INTRAVENOUS | Status: DC | PRN

## 2022-07-22 MED ORDER — ADULT MULTIVITAMIN W/MINERALS CH
1.0000 | ORAL_TABLET | Freq: Every day | ORAL | Status: DC
  Administered 2022-07-22 – 2022-07-25 (×4): 1 via ORAL
  Filled 2022-07-22 (×4): qty 1

## 2022-07-22 MED ORDER — BENAZEPRIL HCL 20 MG PO TABS
20.0000 mg | ORAL_TABLET | Freq: Every day | ORAL | Status: DC
  Administered 2022-07-22: 20 mg via ORAL
  Filled 2022-07-22 (×3): qty 1

## 2022-07-22 MED ORDER — SODIUM CHLORIDE 0.9% FLUSH
3.0000 mL | Freq: Two times a day (BID) | INTRAVENOUS | Status: DC
  Administered 2022-07-22 – 2022-07-23 (×3): 3 mL via INTRAVENOUS

## 2022-07-22 MED ORDER — ~~LOC~~ CARDIAC SURGERY, PATIENT & FAMILY EDUCATION: Freq: Once | Status: AC

## 2022-07-22 MED ORDER — SODIUM CHLORIDE 0.9 % IV SOLN: 250.0000 mL | INTRAVENOUS | Status: DC | PRN

## 2022-07-22 NOTE — Progress Notes (Signed)
CARDIAC REHAB PHASE I   PRE:  Rate/Rhythm: 88 SR    BP: sitting 132/77    SpO2: 93 RA  MODE:  Ambulation: 290 ft   POST:  Rate/Rhythm: 108 ST    BP: sitting 138/77     SpO2: 92 RA  Pt moved to EOB and stood with verbal cues. LOB upon standing therefore used RW for ambulation. Slow and steady, fatigue with distance. To recliner, VSS. Wife helpful. Encouraged IS and another walk today.  [id number]   Ethelda Chick BS, ACSM-CEP 07/22/2022 2:25 PM

## 2022-07-22 NOTE — Progress Notes (Addendum)
TCTS DAILY ICU PROGRESS NOTE                   [address]            [place] [address]          [phone]   2 Days Post-Op Procedure(s) (LRB): XI ROBOTIC ASSISTED RESECTION OF MEDIASTINAL MASS/LYMPH NODE EXCISION (Right) STERNOTOMY (N/A)  Total Length of Stay:  LOS: 2 days   Subjective: Patient sitting in chair, watching tv. He has already walked this am. His pain is fairly well controlled.  Objective: Vital signs in last 24 hours: Temp:  [98.5 F (36.9 C)-100 F (37.8 C)] 99.2 F (37.3 C) (05/03 0357) Pulse Rate:  [78-96] 79 (05/03 0430) Cardiac Rhythm: Normal sinus rhythm (05/03 0000) Resp:  [13-30] 13 (05/03 0430) BP: (105-150)/(54-100) 105/61 (05/03 0430) SpO2:  [89 %-97 %] 95 % (05/03 0430) Arterial Line BP: (77-195)/(62-134) 114/62 (05/03 0430)  Filed Weights   07/20/22 0948 07/21/22 0600  Weight: 91.2 kg 91.6 kg    Weight change:      Intake/Output from previous day: 05/02 0701 - 05/03 0700 In: 722.5 [I.V.:472.5; IV Piggyback:249.9] Out: 3450 [Urine:3330; Chest Tube:120]  Intake/Output this shift: No intake/output data recorded.  Current Meds: Scheduled Meds:  acetaminophen  1,000 mg Oral Q6H   Or   acetaminophen (TYLENOL) oral liquid 160 mg/5 mL  1,000 mg Per Tube Q6H   amLODipine  10 mg Oral Daily   bisacodyl  10 mg Oral Daily   Or   bisacodyl  10 mg Rectal Daily   Chlorhexidine Gluconate Cloth  6 each Topical Daily   docusate sodium  200 mg Oral Daily   enoxaparin (LOVENOX) injection  40 mg Subcutaneous Daily   hydrochlorothiazide  25 mg Oral Daily   pantoprazole  40 mg Oral Daily   sodium chloride flush  3 mL Intravenous Q12H   spiritus frumenti  1 each Oral BID   Continuous Infusions:  sodium chloride     sodium chloride 10 mL/hr at 07/22/22 0442    ceFAZolin (ANCEF) IV 2 g (07/22/22 0501)   nitroGLYCERIN 30 mcg/min (07/22/22 0442)   phenylephrine (NEO-SYNEPHRINE) Adult infusion     PRN Meds:.sodium chloride,  hydrALAZINE, morphine injection, ondansetron (ZOFRAN) IV, mouth rinse, oxyCODONE, sodium chloride flush  General appearance: alert, cooperative, and no distress Neurologic: intact Heart: RRR Lungs: Slightly diminished bibasilar breath sounds Abdomen: Soft, non tender, bowel sounds present Extremities: No LE edema Wound: Aquacel intact and remainder of wounds clean and dry  Lab Results: CBC: Recent Labs    07/20/22 1509 07/21/22 0408  WBC  --  11.2*  HGB 11.9* 11.5*  HCT 35.0* 31.8*  PLT  --  186   BMET:  Recent Labs    07/20/22 1509 07/21/22 0408  NA 138 130*  K 4.0 4.2  CL  --  97*  CO2  --  24  GLUCOSE  --  139*  BUN  --  8  CREATININE  --  1.19  CALCIUM  --  8.0*    CMET: Lab Results  Component Value Date   WBC 11.2 (H) 07/21/2022   HGB 11.5 (L) 07/21/2022   HCT 31.8 (L) 07/21/2022   PLT 186 07/21/2022   GLUCOSE 139 (H) 07/21/2022   CHOL 204 (H) 09/20/2018   TRIG 86 09/20/2018   HDL 58 09/20/2018   LDLCALC 127 (H) 09/20/2018   ALT 35 07/18/2022   AST 29 07/18/2022  NA 130 (L) 07/21/2022   K 4.2 07/21/2022   CL 97 (L) 07/21/2022   CREATININE 1.19 07/21/2022   BUN 8 07/21/2022   CO2 24 07/21/2022   TSH 1.936 07/18/2022   INR 1.1 07/18/2022   HGBA1C 5.1 09/20/2018   PT/INR: No results for input(s): "LABPROT", "INR" in the last 72 hours. Radiology: No results found.   Assessment/Plan: S/P Procedure(s) (LRB): XI ROBOTIC ASSISTED RESECTION OF MEDIASTINAL MASS/LYMPH NODE EXCISION (Right) STERNOTOMY (N/A)  CV-SR. On low dose Nitro drip. Also, on HCTZ 25 mg daily and Amlodipine 10 mg daily. Will restart Benazepril (low dose, monitor creatinine and likely stop Nitro drip. Pulmonary-on 2 liters of oxygen via [place]. Chest tubes with 120 cc last 24 hours. No CXR ordered for today. Likely remove chest tubes.Encourage incentive spirometer. Await final pathology of mediastinal mass. Expected post op blood loss anemia-H and H yesterday 11.5 and 31.8 4. History  of etoh abuse-refusing beer. Started daily Thiamine, MVI 5. On Lovenox for DVT prophylaxis 6. Remove foley 7. Transfer to 4E after off Nitro  Donielle Margaretann Loveless PA-C 07/22/2022 7:01 AM  Agree with above Will d/c nitro Will remove Cts Will transfer to the floor  Christiona Siddique O Jimmye Wisnieski

## 2022-07-22 NOTE — Progress Notes (Signed)
Transferred-in from 2H by wheelchair awake and alert 

## 2022-07-22 NOTE — TOC Initial Note (Addendum)
Transition of Care [facility]) - Initial/Assessment Note    Patient Details  Name: Corey Thomas MRN: [id number] Date of Birth: June 26, 1965  Transition of Care [facility]) CM/SW Contact:    Elliot Cousin, RN Phone Number: [phone] 07/22/2022, 5:04 PM  Clinical Narrative:   TOC CM spoke to pt at bedside. Wife at home to assist with care. Pt states he was independent PTA. States his FMLA/disability has been completed prior to surgery. Will continue to follow for dc needs.      Possible HH or DME needs.            Expected Discharge Plan: Home/Self Care Barriers to Discharge: Continued Medical Work up   Patient Goals and CMS Choice            Expected Discharge Plan and Services   Discharge Planning Services: CM Consult   Living arrangements for the past 2 months: Apartment                                      Prior Living Arrangements/Services Living arrangements for the past 2 months: Apartment Lives with:: Spouse Patient language and need for interpreter reviewed:: Yes Do you feel safe going back to the place where you live?: Yes      Need for Family Participation in Patient Care: No (Comment) Care giver support system in place?: Yes (comment)   Criminal Activity/Legal Involvement Pertinent to Current Situation/Hospitalization: No - Comment as needed  Activities of Daily Living      Permission Sought/Granted Permission sought to share information with : Case Manager, Family Supports, PCP Permission granted to share information with : Yes, Verbal Permission Granted  Share Information with NAME: [name]     Permission granted to share info w Relationship: wife  Permission granted to share info w Contact Information: [phone]  Emotional Assessment Appearance:: Appears stated age Attitude/Demeanor/Rapport: Engaged Affect (typically observed): Accepting Orientation: : Oriented to Self, Oriented to Place, Oriented to  Time, Oriented to  Situation   Psych Involvement: No (comment)  Admission diagnosis:  Mediastinal mass [J98.59] Patient Active Problem List   Diagnosis Date Noted   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   Rectal bleeding 01/24/2022   Blood in stool 07/28/2021   New onset of headaches 05/14/2021   Screening due 03/03/2020   Overweight (BMI 25.0-29.9) 04/16/2019   Erectile dysfunction 03/12/2019   Primary osteoarthritis of right knee 02/21/2017   Primary insomnia 02/21/2017   Tubular adenoma of colon 11/14/2016   HLD (hyperlipidemia) 07/15/2016   Alcohol cessation counseling 07/04/2016   Noncompliance with medications 06/08/2016   Tobacco dipper 06/08/2016   GERD without esophagitis 06/08/2016   Monocular visual disturbance 06/08/2016   Essential hypertension 10/14/2013   PCP:  Patient, No Pcp Per Pharmacy:   [facility] [place] [place] - [address] [place] [address] Phone: [phone] Fax: [phone]  [facility] [address] [place] - [address] [place] [address] Phone: [phone] Fax: [phone]     Social Determinants of Health (SDOH) Social History: SDOH Screenings   Depression (PHQ2-9): Low Risk  (07/28/2021)  Tobacco Use: High Risk (07/21/2022)   SDOH Interventions:     Readmission Risk Interventions     No data to display

## 2022-07-23 ENCOUNTER — Inpatient Hospital Stay (HOSPITAL_COMMUNITY): Payer: BC Managed Care – PPO

## 2022-07-23 ENCOUNTER — Other Ambulatory Visit: Payer: Self-pay

## 2022-07-23 LAB — BASIC METABOLIC PANEL
Anion gap: 12 (ref 5–15)
BUN: 8 mg/dL (ref 6–20)
CO2: 27 mmol/L (ref 22–32)
Calcium: 9 mg/dL (ref 8.9–10.3)
Chloride: 94 mmol/L — ABNORMAL LOW (ref 98–111)
Creatinine, Ser: 1.16 mg/dL (ref 0.61–1.24)
GFR, Estimated: >60 mL/min (ref >60)
Glucose, Bld: 136 mg/dL — ABNORMAL HIGH (ref 70–99)
Potassium: 3.1 mmol/L — ABNORMAL LOW (ref 3.5–5.1)
Sodium: 133 mmol/L — ABNORMAL LOW (ref 135–145)

## 2022-07-23 LAB — CBC
HCT: 36.4 % — ABNORMAL LOW (ref 39.0–52.0)
Hemoglobin: 13 g/dL (ref 13.0–17.0)
MCH: 31.4 pg (ref 26.0–34.0)
MCHC: 35.7 g/dL (ref 30.0–36.0)
MCV: 87.9 fL (ref 80.0–100.0)
Platelets: 184 10*3/uL (ref 150–400)
RBC: 4.14 MIL/uL — ABNORMAL LOW (ref 4.22–5.81)
RDW: 12.8 % (ref 11.5–15.5)
WBC: 8.8 10*3/uL (ref 4.0–10.5)
nRBC: 0 % (ref 0.0–0.2)

## 2022-07-23 LAB — GLUCOSE, CAPILLARY: Glucose-Capillary: 131 mg/dL — ABNORMAL HIGH (ref 70–99)

## 2022-07-23 MED ORDER — GUAIFENESIN ER 600 MG PO TB12
600.0000 mg | ORAL_TABLET | Freq: Two times a day (BID) | ORAL | Status: DC
  Administered 2022-07-23 – 2022-07-25 (×4): 600 mg via ORAL
  Filled 2022-07-23 (×5): qty 1

## 2022-07-23 MED ORDER — BENAZEPRIL HCL 20 MG PO TABS
40.0000 mg | ORAL_TABLET | Freq: Every day | ORAL | Status: DC
  Administered 2022-07-23 – 2022-07-25 (×3): 40 mg via ORAL
  Filled 2022-07-23 (×3): qty 2

## 2022-07-23 NOTE — Progress Notes (Signed)
CARDIAC REHAB PHASE I   PRE:  Rate/Rhythm: Sinus 96  BP:  Supine: 131/76     SaO2: 97%RA  MODE:  Ambulation: 325 ft   POST:  Rate/Rhythem: 116  BP:  Supine: 134/79      SaO2: 95% RA  1011-1041   Patient walked in the hallway with assistance times one. Tolerated well. Patient assisted back to bed with call bell within reach, family present.  Thayer Headings RN

## 2022-07-23 NOTE — Progress Notes (Signed)
      [address]       [place] [address]             [phone]      3 Days Post-Op Procedure(s) (LRB): XI ROBOTIC ASSISTED RESECTION OF MEDIASTINAL MASS/LYMPH NODE EXCISION (Right) STERNOTOMY (N/A) Subjective: Patient states his pain is controlled and he is breathing and walking well.   Objective: Vital signs in last 24 hours: Temp:  [98.7 F (37.1 C)-99.8 F (37.7 C)] 98.7 F (37.1 C) (05/04 0727) Pulse Rate:  [74-102] 91 (05/04 0727) Cardiac Rhythm: Normal sinus rhythm (05/04 0700) Resp:  [14-25] 16 (05/04 0727) BP: (119-157)/(59-84) 138/82 (05/04 0727) SpO2:  [92 %-99 %] 96 % (05/04 0727) Weight:  [86.5 kg] 86.5 kg (05/04 0500)  Hemodynamic parameters for last 24 hours:    Intake/Output from previous day: 05/03 0701 - 05/04 0700 In: 109.2 [I.V.:9.2; IV Piggyback:100] Out: 625 [Urine:625] Intake/Output this shift: No intake/output data recorded.  General appearance: alert, cooperative, and no distress Neurologic: intact Heart: regular rate and rhythm, some ST, no murmur Lungs: Slightly diminished bibasilar Abdomen: soft, non-tender; bowel sounds normal; no masses,  no organomegaly Extremities: extremities normal, atraumatic, no cyanosis or edema Wound: Clean and dry, no erythema or sign of infection  Lab Results: Recent Labs    07/21/22 0408 07/23/22 0024  WBC 11.2* 8.8  HGB 11.5* 13.0  HCT 31.8* 36.4*  PLT 186 184   BMET:  Recent Labs    07/21/22 0408 07/23/22 0024  NA 130* 133*  K 4.2 3.1*  CL 97* 94*  CO2 24 27  GLUCOSE 139* 136*  BUN 8 8  CREATININE 1.19 1.16  CALCIUM 8.0* 9.0    PT/INR: No results for input(s): "LABPROT", "INR" in the last 72 hours. ABG    Component Value Date/Time   PHART 7.334 (L) 07/20/2022 1509   HCO3 25.3 07/20/2022 1509   TCO2 27 07/20/2022 1509   ACIDBASEDEF 1.0 07/20/2022 1509   O2SAT 98 07/20/2022 1509   CBG (last 3)  Recent Labs    07/23/22 0025  GLUCAP 131*     Assessment/Plan: S/P Procedure(s) (LRB): XI ROBOTIC ASSISTED RESECTION OF MEDIASTINAL MASS/LYMPH NODE EXCISION (Right) STERNOTOMY (N/A)  Neuro: Pain controlled this AM    CV: NSR-ST. HR 90s-103. SBP 130-150s, on HCTZ 25mg  daily, Norvasc 10mg  daily, and Benazepril 20mg  daily. Will titrate Benazepril to home dose, monitor Cr closely.    Pulm: Saturating well on room air this AM. CXR shows trace right hydropneumothorax and trace left pleural effusion.Continue IS and ambulation.   GI: +BM. No nausea. Appetite improving.   Renal: Stable Cr 1.16.    Leukocytosis: Resolved   Expected postop ABLA: Improved H/H 13/36.4   DVT Prophylaxis: Lovenox   Hx of alcohol abuse: Pt drinks 6 drinks per night. Refused beer, continue MVI and Thiamine. Monitor closely for withdrawal.   Dispo: Titrate Benazepril and monitor Cr closely. Hopefully d/c to home Monday.   LOS: 3 days    Jenny Reichmann, PA-C 07/23/2022

## 2022-07-23 NOTE — Plan of Care (Signed)

## 2022-07-24 ENCOUNTER — Inpatient Hospital Stay (HOSPITAL_COMMUNITY): Payer: BC Managed Care – PPO

## 2022-07-24 LAB — BASIC METABOLIC PANEL
Anion gap: 11 (ref 5–15)
BUN: 11 mg/dL (ref 6–20)
CO2: 26 mmol/L (ref 22–32)
Calcium: 9.1 mg/dL (ref 8.9–10.3)
Chloride: 94 mmol/L — ABNORMAL LOW (ref 98–111)
Creatinine, Ser: 1.14 mg/dL (ref 0.61–1.24)
GFR, Estimated: >60 mL/min (ref >60)
Glucose, Bld: 105 mg/dL — ABNORMAL HIGH (ref 70–99)
Potassium: 2.9 mmol/L — ABNORMAL LOW (ref 3.5–5.1)
Sodium: 131 mmol/L — ABNORMAL LOW (ref 135–145)

## 2022-07-24 LAB — MAGNESIUM: Magnesium: 2.2 mg/dL (ref 1.7–2.4)

## 2022-07-24 MED ORDER — POTASSIUM CHLORIDE CRYS ER 20 MEQ PO TBCR
40.0000 meq | EXTENDED_RELEASE_TABLET | Freq: Two times a day (BID) | ORAL | Status: DC
  Administered 2022-07-24 (×2): 40 meq via ORAL
  Filled 2022-07-24 (×2): qty 2

## 2022-07-24 MED ORDER — POTASSIUM CHLORIDE CRYS ER 20 MEQ PO TBCR
20.0000 meq | EXTENDED_RELEASE_TABLET | Freq: Once | ORAL | Status: AC
  Administered 2022-07-24: 20 meq via ORAL
  Filled 2022-07-24: qty 1

## 2022-07-24 NOTE — Progress Notes (Addendum)
      [address]       [place] [address]             [phone]      4 Days Post-Op Procedure(s) (LRB): XI ROBOTIC ASSISTED RESECTION OF MEDIASTINAL MASS/LYMPH NODE EXCISION (Right) STERNOTOMY (N/A) Subjective: Patient has no new complaints this AM.   Objective: Vital signs in last 24 hours: Temp:  [98.1 F (36.7 C)-98.9 F (37.2 C)] 98.3 F (36.8 C) (05/05 0322) Pulse Rate:  [83-95] 85 (05/05 0600) Cardiac Rhythm: Normal sinus rhythm (05/05 0727) Resp:  [14-22] 20 (05/05 0600) BP: (124-134)/(72-80) 134/76 (05/05 0322) SpO2:  [93 %-99 %] 96 % (05/05 0600) Weight:  [85.9 kg] 85.9 kg (05/05 0613)  Hemodynamic parameters for last 24 hours:    Intake/Output from previous day: 05/04 0701 - 05/05 0700 In: 920 [P.O.:920] Out: -  Intake/Output this shift: No intake/output data recorded.  General appearance: alert, cooperative, and no distress Neurologic: intact Heart: regular rate and rhythm, S1, S2 normal, no murmur, click, rub or gallop Lungs: clear to auscultation bilaterally Abdomen: soft, non-tender; bowel sounds normal; no masses,  no organomegaly Extremities: extremities normal, atraumatic, no cyanosis or edema Wound: Clean and dry incisions without erythema or sign of infection  Lab Results: Recent Labs    07/23/22 0024  WBC 8.8  HGB 13.0  HCT 36.4*  PLT 184   BMET:  Recent Labs    07/23/22 0024 07/24/22 0014  NA 133* 131*  K 3.1* 2.9*  CL 94* 94*  CO2 27 26  GLUCOSE 136* 105*  BUN 8 11  CREATININE 1.16 1.14  CALCIUM 9.0 9.1    PT/INR: No results for input(s): "LABPROT", "INR" in the last 72 hours. ABG    Component Value Date/Time   PHART 7.334 (L) 07/20/2022 1509   HCO3 25.3 07/20/2022 1509   TCO2 27 07/20/2022 1509   ACIDBASEDEF 1.0 07/20/2022 1509   O2SAT 98 07/20/2022 1509   CBG (last 3)  Recent Labs    07/23/22 0025  GLUCAP 131*    Assessment/Plan: S/P Procedure(s) (LRB): XI ROBOTIC ASSISTED RESECTION OF  MEDIASTINAL MASS/LYMPH NODE EXCISION (Right) STERNOTOMY (N/A)  Neuro: Pain controlled this AM    CV: NSR, HR 80s. SBP 130s, on HCTZ 25mg  daily, Norvasc 10mg  daily, and Benazepril 40mg  daily.   Pulm: Saturating well on room air this AM. CXR is stable without pneumothorax. CXR with mild atelectasis. Ambulating well. Continue IS and ambulation.    GI: +BM. No nausea. Appetite improving.   Renal: Stable Cr 1.14.   Hypokalemia: K 2.9 this AM, likely due to HCTZ. Will supplement and check in the AM.   Leukocytosis: Resolved   Expected postop ABLA: Improved H/H 13/36.4   DVT Prophylaxis: Lovenox   Hx of alcohol abuse: Pt drinks 6 drinks per night. Refused beer, continue MVI and Thiamine. Monitor closely for withdrawal.   Dispo: Supplement K today. D/C in the AM if hypokalemia has resolved.   LOS: 4 days    Jenny Reichmann, PA-C 07/24/2022  Agree with above Doing well Home tomorrow  Corliss Skains

## 2022-07-24 NOTE — Progress Notes (Signed)
Mid sternal incision site cleansed with iodine. Dressing to lower sternum applied. Sites clean and dry.

## 2022-07-25 LAB — BASIC METABOLIC PANEL
Anion gap: 11 (ref 5–15)
BUN: 13 mg/dL (ref 6–20)
CO2: 27 mmol/L (ref 22–32)
Calcium: 9 mg/dL (ref 8.9–10.3)
Chloride: 95 mmol/L — ABNORMAL LOW (ref 98–111)
Creatinine, Ser: 1.15 mg/dL (ref 0.61–1.24)
GFR, Estimated: >60 mL/min (ref >60)
Glucose, Bld: 103 mg/dL — ABNORMAL HIGH (ref 70–99)
Potassium: 3.2 mmol/L — ABNORMAL LOW (ref 3.5–5.1)
Sodium: 133 mmol/L — ABNORMAL LOW (ref 135–145)

## 2022-07-25 MED ORDER — PANTOPRAZOLE SODIUM 40 MG PO TBEC: 40.0000 mg | DELAYED_RELEASE_TABLET | Freq: Every day | ORAL | 1 refills | Status: AC

## 2022-07-25 MED ORDER — VITAMIN B-1 100 MG PO TABS: 100.0000 mg | ORAL_TABLET | Freq: Every day | ORAL | Status: AC

## 2022-07-25 MED ORDER — ADULT MULTIVITAMIN W/MINERALS CH: 1.0000 | ORAL_TABLET | Freq: Every day | ORAL | Status: AC

## 2022-07-25 MED ORDER — POTASSIUM CHLORIDE CRYS ER 20 MEQ PO TBCR
40.0000 meq | EXTENDED_RELEASE_TABLET | Freq: Two times a day (BID) | ORAL | Status: DC
  Administered 2022-07-25: 40 meq via ORAL
  Filled 2022-07-25: qty 2

## 2022-07-25 MED ORDER — POTASSIUM CHLORIDE CRYS ER 20 MEQ PO TBCR: 20.0000 meq | EXTENDED_RELEASE_TABLET | Freq: Every day | ORAL | 1 refills | Status: AC

## 2022-07-25 MED ORDER — OXYCODONE HCL 5 MG PO TABS: 5.0000 mg | ORAL_TABLET | Freq: Four times a day (QID) | ORAL | 0 refills | Status: AC | PRN

## 2022-07-25 NOTE — Progress Notes (Signed)
      [address]       [place] [address]             [phone]      5 Days Post-Op Procedure(s) (LRB): XI ROBOTIC ASSISTED RESECTION OF MEDIASTINAL MASS/LYMPH NODE EXCISION (Right) STERNOTOMY (N/A) Subjective: Patient with no new complaints this AM. PT and wife ready to go home.  Objective: Vital signs in last 24 hours: Temp:  [98.1 F (36.7 C)-98.8 F (37.1 C)] 98.8 F (37.1 C) (05/06 0429) Pulse Rate:  [80-89] 81 (05/06 0429) Cardiac Rhythm: Normal sinus rhythm (05/05 1900) Resp:  [16-23] 16 (05/06 0429) BP: (126-139)/(78-88) 130/82 (05/06 0429) SpO2:  [95 %-98 %] 98 % (05/06 0429) Weight:  [84.5 kg] 84.5 kg (05/06 0544)  Hemodynamic parameters for last 24 hours:    Intake/Output from previous day: 05/05 0701 - 05/06 0700 In: 450 [P.O.:450] Out: -  Intake/Output this shift: No intake/output data recorded.  General appearance: alert, cooperative, and no distress Neurologic: intact Heart: regular rate and rhythm, no murmur Lungs: clear to auscultation bilaterally Abdomen: soft, non-tender; bowel sounds normal; no masses,  no organomegaly Extremities: extremities normal, atraumatic, no cyanosis or edema Wound: Clean and dry, no erythema or sign of infection  Lab Results: Recent Labs    07/23/22 0024  WBC 8.8  HGB 13.0  HCT 36.4*  PLT 184   BMET:  Recent Labs    07/24/22 0014 07/25/22 0017  NA 131* 133*  K 2.9* 3.2*  CL 94* 95*  CO2 26 27  GLUCOSE 105* 103*  BUN 11 13  CREATININE 1.14 1.15  CALCIUM 9.1 9.0    PT/INR: No results for input(s): "LABPROT", "INR" in the last 72 hours. ABG    Component Value Date/Time   PHART 7.334 (L) 07/20/2022 1509   HCO3 25.3 07/20/2022 1509   TCO2 27 07/20/2022 1509   ACIDBASEDEF 1.0 07/20/2022 1509   O2SAT 98 07/20/2022 1509   CBG (last 3)  Recent Labs    07/23/22 0025  GLUCAP 131*    Assessment/Plan: S/P Procedure(s) (LRB): XI ROBOTIC ASSISTED RESECTION OF MEDIASTINAL  MASS/LYMPH NODE EXCISION (Right) STERNOTOMY (N/A)  Neuro: Pain controlled this AM    CV: NSR, HR 80s. SBP 130s. Continue HCTZ 25mg  daily, Norvasc 10mg  daily, and Benazepril 40mg  daily.    Pulm: Saturating well on room air. Ambulating well. Continue IS and ambulation.    GI: +BM. Good appetite   Renal: Stable Cr 1.15   Hypokalemia: Hx of hypokalemia. K 3.2 this AM after PO supplementation, likely due to HCTZ. Continue supplementation at discharge.   Leukocytosis: Resolved   Expected postop ABLA: Improved H/H 13/36.4   DVT Prophylaxis: Lovenox   Hx of alcohol abuse: Pt drinks 6 drinks per night. Continue MVI and Thiamine. No signs of withdrawal.   Dispo: Hx of hypokalemia, D/C patient with K supplement.   LOS: 5 days    Jenny Reichmann, PA-C 07/25/2022

## 2022-07-25 NOTE — Progress Notes (Signed)
Pt sts he has been ambulating on his own. Discussed IS, sternal precautions, exercise, tobacco cessation, and CRPII. Pt receptive, wife present. Gave materials. He is motivated to quit tobacco. Not interested in [facility] and his diagnosis would most likely not be covered.  [id number] Ethelda Chick BS, ACSM-CEP 07/25/2022 9:07 AM

## 2022-07-26 LAB — BPAM RBC
Blood Product Expiration Date: 202405312359
Blood Product Expiration Date: 202405312359
Blood Product Expiration Date: 202406012359
Blood Product Expiration Date: 202406012359
ISSUE DATE / TIME: 202405011510
ISSUE DATE / TIME: 202405011510
ISSUE DATE / TIME: 202405012235
ISSUE DATE / TIME: 202405020838
Unit Type and Rh: 5100
Unit Type and Rh: 5100
Unit Type and Rh: 5100
Unit Type and Rh: 5100
Unit Type and Rh: 5100

## 2022-07-26 LAB — TYPE AND SCREEN
ABO/RH(D): O POS
Unit division: 0
Unit division: 0
Unit division: 0
Unit division: 0

## 2022-07-27 LAB — SURGICAL PATHOLOGY

## 2022-07-28 NOTE — Progress Notes (Signed)
      [address]       [place] [address]             [phone]        Corey Thomas [facility] Medical Record #[id number] Date of Birth: 09-Oct-1965  Referring: Amedeo Kinsman, MD Primary Care: Patient, No Pcp Per Primary Cardiologist:None  Reason for visit:   follow-up  History of Present Illness:     58yo male presents for his 1st follow-up appointment.  Overall, he is doing well.  His pain is well controlled  Physical Exam: There were no vitals taken for this visit.  Alert NAD Incision clean.  Sternum stable Abdomen, ND no peripheral edema   Diagnostic Studies & Laboratory data:  Path: A. 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       Negative for malignancy.       See comment.   B. LYMPH NODE, LEVEL 5, EXCISION:       Two lymph nodes, negative for metastatic carcinoma (0/2).     Assessment / Plan:   57yo male s/p thymectomy for benign tissue and follicular hyperplasia with benign cyst.  He will follow-up in 1 month with a CXR.  Continue sternal precautions.    Corliss Skains 07/28/2022 3:46 PM

## 2022-07-29 ENCOUNTER — Telehealth: Payer: BC Managed Care – PPO | Admitting: Thoracic Surgery (Cardiothoracic Vascular Surgery)

## 2022-07-29 ENCOUNTER — Encounter: Payer: Self-pay | Admitting: Thoracic Surgery (Cardiothoracic Vascular Surgery)

## 2022-07-29 ENCOUNTER — Ambulatory Visit (INDEPENDENT_AMBULATORY_CARE_PROVIDER_SITE_OTHER): Payer: Self-pay | Admitting: Thoracic Surgery (Cardiothoracic Vascular Surgery)

## 2022-07-29 VITALS — BP 116/73 | HR 84 | Resp 20 | Ht 72.0 in | Wt 191.9 lb

## 2022-07-29 DIAGNOSIS — D150 Benign neoplasm of thymus: Secondary | ICD-10-CM | POA: Diagnosis not present

## 2022-07-29 DIAGNOSIS — J9859 Other diseases of mediastinum, not elsewhere classified: Secondary | ICD-10-CM

## 2022-07-29 DIAGNOSIS — K219 Gastro-esophageal reflux disease without esophagitis: Secondary | ICD-10-CM | POA: Diagnosis not present

## 2022-07-29 DIAGNOSIS — I10 Essential (primary) hypertension: Secondary | ICD-10-CM | POA: Diagnosis not present

## 2022-08-03 LAB — MISC LABCORP TEST (SEND OUT): Labcorp test code: 165605

## 2022-08-05 MED FILL — Heparin Sodium (Porcine) Inj 1000 Unit/ML: INTRAMUSCULAR | Qty: 30 | Status: AC

## 2022-08-05 MED FILL — Sodium Chloride IV Soln 0.9%: INTRAVENOUS | Qty: 2000 | Status: AC

## 2022-08-26 ENCOUNTER — Other Ambulatory Visit: Payer: Self-pay | Admitting: Internal Medicine

## 2022-08-26 ENCOUNTER — Ambulatory Visit
Admission: RE | Admit: 2022-08-26 | Discharge: 2022-08-26 | Disposition: A | Discharge status: Home / Self Care | Payer: BC Managed Care – PPO | Source: Ambulatory Visit | Attending: Internal Medicine | Admitting: Internal Medicine

## 2022-08-26 DIAGNOSIS — R0789 Other chest pain: Secondary | ICD-10-CM

## 2022-08-26 DIAGNOSIS — R0602 Shortness of breath: Secondary | ICD-10-CM | POA: Diagnosis not present

## 2022-08-26 DIAGNOSIS — R079 Chest pain, unspecified: Secondary | ICD-10-CM | POA: Diagnosis not present

## 2022-08-26 DIAGNOSIS — M542 Cervicalgia: Secondary | ICD-10-CM | POA: Diagnosis not present

## 2022-09-07 ENCOUNTER — Other Ambulatory Visit: Payer: Self-pay | Admitting: Thoracic Surgery (Cardiothoracic Vascular Surgery)

## 2022-09-07 DIAGNOSIS — R918 Other nonspecific abnormal finding of lung field: Secondary | ICD-10-CM

## 2022-09-09 ENCOUNTER — Encounter: Payer: Self-pay | Admitting: Thoracic Surgery (Cardiothoracic Vascular Surgery)

## 2022-09-09 ENCOUNTER — Ambulatory Visit
Admission: RE | Admit: 2022-09-09 | Discharge: 2022-09-09 | Disposition: A | Discharge status: Home / Self Care | Payer: BC Managed Care – PPO | Source: Ambulatory Visit | Attending: Thoracic Surgery (Cardiothoracic Vascular Surgery) | Admitting: Thoracic Surgery (Cardiothoracic Vascular Surgery)

## 2022-09-09 ENCOUNTER — Ambulatory Visit (INDEPENDENT_AMBULATORY_CARE_PROVIDER_SITE_OTHER): Payer: Self-pay | Admitting: Thoracic Surgery (Cardiothoracic Vascular Surgery)

## 2022-09-09 ENCOUNTER — Encounter: Payer: Self-pay | Admitting: *Deleted

## 2022-09-09 VITALS — BP 138/86 | HR 86 | Resp 20 | Ht 72.0 in | Wt 192.4 lb

## 2022-09-09 DIAGNOSIS — R918 Other nonspecific abnormal finding of lung field: Secondary | ICD-10-CM | POA: Diagnosis not present

## 2022-09-09 DIAGNOSIS — J9859 Other diseases of mediastinum, not elsewhere classified: Secondary | ICD-10-CM

## 2022-09-09 NOTE — Progress Notes (Signed)
      [address]       [place] [address]             [phone]        EDREES VALENT [facility] Medical Record #[id number] Date of Birth: Oct 26, 1965  Referring: Amedeo Kinsman, MD Primary Care: Patient, No Pcp Per Primary Cardiologist:None  Reason for visit:   follow-up  History of Present Illness:     57 year old male presents in follow-up mediastinal mass resection.  Overall he is doing well.  Physical Exam: BP 138/86 (BP Location: Left Arm, Patient Position: Sitting, Cuff Size: Normal)   Pulse 86   Resp 20   Ht 6' (1.829 m)   Wt 192 lb 6.4 oz (87.3 kg)   SpO2 100% Comment: RA  BMI 26.09 kg/m   Alert NAD Incision clean.  Sternum stable Abdomen, ND No peripheral edema   Diagnostic Studies & Laboratory data: CXR: Clear   Assessment / Plan:   57 year old male status post sternotomy for resection of benign mediastinal mass.  He is cleared to resume normal activities and return to work.  He will follow-up as needed.   Corliss Skains 09/09/2022 3:53 PM

## 2022-11-08 DIAGNOSIS — Z9089 Acquired absence of other organs: Secondary | ICD-10-CM | POA: Diagnosis not present

## 2022-11-08 DIAGNOSIS — Z125 Encounter for screening for malignant neoplasm of prostate: Secondary | ICD-10-CM | POA: Diagnosis not present

## 2022-11-08 DIAGNOSIS — K219 Gastro-esophageal reflux disease without esophagitis: Secondary | ICD-10-CM | POA: Diagnosis not present

## 2022-11-08 DIAGNOSIS — I10 Essential (primary) hypertension: Secondary | ICD-10-CM | POA: Diagnosis not present

## 2022-11-08 DIAGNOSIS — Z13220 Encounter for screening for lipoid disorders: Secondary | ICD-10-CM | POA: Diagnosis not present

## 2023-06-21 ENCOUNTER — Ambulatory Visit: Payer: Self-pay | Attending: Cardiology | Admitting: Cardiology

## 2023-06-21 DIAGNOSIS — Z91199 Patient's noncompliance with other medical treatment and regimen due to unspecified reason: Secondary | ICD-10-CM

## 2023-06-21 DIAGNOSIS — R0789 Other chest pain: Secondary | ICD-10-CM | POA: Insufficient documentation

## 2023-06-21 NOTE — Progress Notes (Unsigned)
  Cardiology Office Note:  .   Date:  06/21/2023  ID:  Erich Montane, DOB 15-Apr-1965, MRN [id number] PCP: Georgann Housekeeper, MD  [facility] Providers Cardiologist:  Truett Mainland, MD PCP: Georgann Housekeeper, MD  No chief complaint on file.    AMINE ADELSON is a 58 y.o. male with *** Discussed the use of AI scribe software for clinical note transcription with the patient, who gave verbal consent to proceed.  History of Present Illness       There were no vitals filed for this visit.    ROS      Studies Reviewed: .        *** Independently interpreted 07/2022: Chol ***, TG ***, HDL ***, LDL *** HbA1C ***% Hb 13.0 Cr 1.15, Na 133 TSH 1.9  MRI chest 06/2022: Heterogeneously enhancing mixed solid and cystic nodular soft tissue in the anterior mediastinum, overall dimensions approximately 6.7 x 3.3 x 2.2 cm. 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   CTA chest 05/2022: 1. No evidence of acute aortic syndrome or other acute pathology in the chest, abdomen, or pelvis. 2. 3.0 cm upper anterior mediastinal mass is suspected of thymic origin, possibly a thymoma. Recommend MRI with and without contrast for further evaluation, and recommend correlation for any signs or symptoms of myasthenia gravis. 3. 1.3 cm ground-glass opacity in the right lower lobe, nonspecific. Per Fleischner Society Guidelines, recommend a non-contrast Chest CT at 6 months to confirm persistence, then additional non-contrast Chest CTs every 2 years until 5 years stability has been established.  Risk Assessment/Calculations:   {Does this patient have ATRIAL FIBRILLATION?:[phone]}    Physical Exam   VISIT DIAGNOSES: No diagnosis found.   ARIC JOST is a 58 y.o. male with *** Assessment and Plan Assessment & Plan       {Are you ordering a CV Procedure (e.g. stress test, cath, DCCV, TEE, etc)?   Press F2        :[id number]}    No orders of the defined types  were placed in this encounter.    F/u in ***  Signed, Elder Negus, MD

## 2023-06-22 ENCOUNTER — Encounter: Payer: Self-pay | Admitting: Cardiology

## 2023-10-09 NOTE — Progress Notes (Deleted)
 CARDIOLOGY CONSULT NOTE       Patient ID: Corey Thomas MRN: [id number] DOB/AGE: 1965/09/24 58 y.o.  Admit date: (Not on file) Referring Physician: Ransom Primary Physician: Ransom Other, MD Primary Cardiologist: New Reason for Consultation: Chest Tightness  Active Problems:   * No active hospital problems. *   HPI:  58 y.o. referred by Dr Ransom for chest tightness. Note he was a no show for Dr Elmira 06/21/23. He has had a mediastinal mass removed by Dr Shyrl This was done robotically with sternotomy on 07/20/22 History of HTN and ETOH abuse. Path noted thymoma. F/U CXR done 09/09/22 NAD. He has not had prior ischemic evaluation.    ROS All other systems reviewed and negative except as noted above  Past Medical History:  Diagnosis Date  . Arthritis   . Diverticulosis 12/15/2016  . GERD without esophagitis 06/08/2016  . HLD (hyperlipidemia) 07/15/2016  . Hypertension   . Thymoma   . Tightness in chest   . Tubular adenoma of colon 11/14/2016  . Vitamin D  deficiency     Family History  Problem Relation Age of Onset  . Healthy Sister   . Healthy Sister   . Alcohol  abuse Brother 41       cirrhosis  . Diabetes Other   . Osteoarthritis Other   . Arthritis Maternal Grandmother     Social History   Socioeconomic History  . Marital status: Married    Spouse name: Not on file  . Number of children: 2  . Years of education: 57  . Highest education level: Not on file  Occupational History  . Occupation: CREW MEMBER    Employer: [employer]    Comment: landscaping  Tobacco Use  . Smoking status: Never    Passive exposure: Past  . Smokeless tobacco: Current    Types: Snuff  . Tobacco comments:    1/2 can daily   Vaping Use  . Vaping status: Never Used  Substance and Sexual Activity  . Alcohol  use: Yes    Alcohol /week: 20.0 standard drinks of alcohol     Types: 20 Cans of beer per week    Comment: 4-5 beers a day  . Drug use:  No  . Sexual activity: Yes    Birth control/protection: None  Other Topics Concern  . Not on file  Social History Narrative   ** Merged History Encounter **       Lives with wife Giles) July 30th 8 years  Son-Marties 17 step son All other child grown Grandchild-1 baby girl   Enjoys: sleep  Diet: fried foods (too much salt) Caffeine: 12 pk daily  Water : Does not like water   Wears seat belt Smoke and car   bon monoxide detectors  Does not use phone while driving    Social Drivers of Health   Financial Resource Strain: Low Risk  (06/23/2022)   Received from [facility]   Overall Financial Resource Strain (CARDIA)   . Difficulty of Paying Living Expenses: Not hard at all  Food Insecurity: No Food Insecurity (07/23/2022)   Hunger Vital Sign   . Worried About Programme researcher, broadcasting/film/video in the Last Year: Never true   . Ran Out of Food in the Last Year: Never true  Transportation Needs: No Transportation Needs (07/23/2022)   PRAPARE - Transportation   . Lack of Transportation (Medical): No   . Lack of Transportation (Non-Medical): No  Physical Activity: Not on file  Stress: Not  on file  Social Connections: Unknown (06/10/2022)   Received from [facility]   Social Network   . Social Network: Not on file  Intimate Partner Violence: Not At Risk (07/23/2022)   Humiliation, Afraid, Rape, and Kick questionnaire   . Fear of Current or Ex-Partner: No   . Emotionally Abused: No   . Physically Abused: No   . Sexually Abused: No    Past Surgical History:  Procedure Laterality Date  . COLONOSCOPY N/A 11/11/2016   Procedure: COLONOSCOPY;  Surgeon: Golda Claudis PENNER, MD;  [facility]: [facility];  Service: Endoscopy;  Laterality: N/A;  12:55-rescheduled to 8/24 @ 10:25am per Jenkins  . COLONOSCOPY WITH PROPOFOL  N/A 02/24/2022   Procedure: COLONOSCOPY WITH PROPOFOL ;  Surgeon: Eartha Angelia Sieving, MD;  Location: [facility];  Service: Gastroenterology;  Laterality: N/A;  2:00pm, asa 3  .  POLYPECTOMY  02/24/2022   Procedure: POLYPECTOMY INTESTINAL;  Surgeon: Eartha Angelia, Sieving, MD;  Location: [facility];  Service: Gastroenterology;;  . STERNOTOMY N/A 07/20/2022   Procedure: STERNOTOMY;  Surgeon: Shyrl Linnie KIDD, MD;  Location: [facility] OR;  Service: Thoracic;  Laterality: N/A;  . THYMECTOMY        Current Outpatient Medications:  .  amLODipine  (NORVASC ) 10 MG tablet, Take 1 tablet (10 mg total) by mouth daily., Disp: 90 tablet, Rfl: 0 .  benazepril  (LOTENSIN ) 40 MG tablet, Take 1 tablet (40 mg total) by mouth daily., Disp: 90 tablet, Rfl: 0 .  hydrochlorothiazide  (HYDRODIURIL ) 25 MG tablet, Take 1 tablet (25 mg total) by mouth daily., Disp: 90 tablet, Rfl: 0 .  Multiple Vitamin (MULTIVITAMIN WITH MINERALS) TABS tablet, Take 1 tablet by mouth daily., Disp: , Rfl:  .  oxyCODONE  (OXY IR/ROXICODONE ) 5 MG immediate release tablet, Take 1 tablet (5 mg total) by mouth every 6 (six) hours as needed for severe pain., Disp: 28 tablet, Rfl: 0 .  pantoprazole  (PROTONIX ) 40 MG tablet, Take 1 tablet (40 mg total) by mouth daily., Disp: 30 tablet, Rfl: 1 .  potassium chloride  SA (KLOR-CON  M) 20 MEQ tablet, Take 1 tablet (20 mEq total) by mouth daily., Disp: 30 tablet, Rfl: 1 .  thiamine  (VITAMIN B-1) 100 MG tablet, Take 1 tablet (100 mg total) by mouth daily., Disp: , Rfl:     Physical Exam: There were no vitals taken for this visit.    Affect appropriate Healthy:  appears stated age HEENT: normal Neck supple with no adenopathy JVP normal no bruits no thyromegaly Lungs clear with no wheezing and good diaphragmatic motion Heart:  S1/S2 no murmur, no rub, gallop or click PMI normal prior sternotomy Abdomen: benighn, BS positve, no tenderness, no AAA no bruit.  No HSM or HJR Distal pulses intact with no bruits No edema Neuro non-focal Skin warm and dry No muscular weakness   Labs:   Lab Results  Component Value Date   WBC 8.8 07/23/2022   HGB 13.0 07/23/2022   HCT  36.4 (L) 07/23/2022   MCV 87.9 07/23/2022   PLT 184 07/23/2022   No results for input(s): NA, K, CL, CO2, BUN, CREATININE, CALCIUM, PROT, BILITOT, ALKPHOS, ALT, AST, GLUCOSE in the last 168 hours.  Invalid input(s): LABALBU Lab Results  Component Value Date   CKTOTAL 686 (H) 11/21/2013    Lab Results  Component Value Date   CHOL 204 (H) 09/20/2018   CHOL 182 06/08/2016   Lab Results  Component Value Date   HDL 58 09/20/2018   HDL 49 06/08/2016   Lab  Results  Component Value Date   LDLCALC 127 (H) 09/20/2018   LDLCALC 117 (H) 06/08/2016   Lab Results  Component Value Date   TRIG 86 09/20/2018   TRIG 79 06/08/2016   Lab Results  Component Value Date   CHOLHDL 3.5 09/20/2018   CHOLHDL 3.7 06/08/2016   No results found for: LDLDIRECT    Radiology: No results found.  EKG: ***   ASSESSMENT AND PLAN:   Chest Pain: in setting of prior sternotomy for thymoma Shared decision making favor cardiac CTA to further evaluate *** Thymoma:  post op CXR May 2024 NAD can re evaluate mediastinum with CTA above ETOH:  discussed moderation ***  Lopressor 100 mg prior to CTA BMET Cardiac CTA  F/U PRN pending results   Signed: Maude Emmer 10/09/2023, 9:16 AM

## 2023-10-12 ENCOUNTER — Ambulatory Visit: Payer: Self-pay | Attending: Cardiovascular Disease | Admitting: Cardiovascular Disease

## 2024-01-03 ENCOUNTER — Encounter (INDEPENDENT_AMBULATORY_CARE_PROVIDER_SITE_OTHER): Payer: Self-pay | Admitting: Gastroenterology

## 2024-04-24 ENCOUNTER — Emergency Department (HOSPITAL_COMMUNITY)
Admission: EM | Admit: 2024-04-24 | Discharge: 2024-04-24 | Disposition: A | Discharge status: Home / Self Care | Source: Home / Self Care | Attending: Emergency Medicine | Admitting: Emergency Medicine

## 2024-04-24 ENCOUNTER — Encounter (HOSPITAL_COMMUNITY): Payer: Self-pay | Admitting: *Deleted

## 2024-04-24 ENCOUNTER — Other Ambulatory Visit: Payer: Self-pay

## 2024-04-24 DIAGNOSIS — M5441 Lumbago with sciatica, right side: Secondary | ICD-10-CM

## 2024-04-24 MED ORDER — PREDNISONE 20 MG PO TABS
20.0000 mg | ORAL_TABLET | Freq: Once | ORAL | Status: AC
  Administered 2024-04-24: 20 mg via ORAL
  Filled 2024-04-24: qty 1

## 2024-04-24 MED ORDER — KETOROLAC TROMETHAMINE 15 MG/ML IJ SOLN
15.0000 mg | Freq: Once | INTRAMUSCULAR | Status: AC
  Administered 2024-04-24: 15 mg via INTRAMUSCULAR
  Filled 2024-04-24: qty 1

## 2024-04-24 MED ORDER — LIDOCAINE 5 % EX PTCH
1.0000 | MEDICATED_PATCH | CUTANEOUS | Status: DC
  Administered 2024-04-24: 1 via TRANSDERMAL
  Filled 2024-04-24: qty 1

## 2024-04-24 MED ORDER — PREDNISONE 10 MG PO TABS: ORAL_TABLET | ORAL | 0 refills | Status: AC

## 2024-04-24 MED ORDER — LIDOCAINE 5 % EX PTCH: 1.0000 | MEDICATED_PATCH | CUTANEOUS | 0 refills | Status: AC

## 2024-04-24 NOTE — ED Provider Notes (Signed)
 " [place] EMERGENCY DEPARTMENT AT [facility] Provider Note   CSN: [id number] Arrival date & time: 04/24/24  9270     Patient presents with: Back Pain   Corey Thomas is a 59 y.o. male patient who presents to the emergency department today for further evaluation of right lower back pain that radiates down the right leg that started 2 days ago.  Patient was shoveling some snow and went to pick up a large shovel snow and felt a tightness in his back.  It has been spasming and radiating down the right leg.  Patient is able to walk but it is worse with bending and going from seated to standing.  Denies any bowel or bladder incontinence, fever, chills, focal weakness or numbness to the legs.    Back Pain      Prior to Admission medications  Medication Sig Start Date End Date Taking? Authorizing Provider  lidocaine  (LIDODERM ) 5 % Place 1 patch onto the skin daily. Remove & Discard patch within 12 hours or as directed by MD 04/24/24  Yes Theotis Peers M, PA-C  predniSONE  (DELTASONE ) 10 MG tablet Take 4 tablets (40 mg total) by mouth daily for 2 days, THEN 2 tablets (20 mg total) daily for 2 days, THEN 1 tablet (10 mg total) daily for 2 days. 04/24/24 04/30/24 Yes Lief Palmatier M, PA-C  amLODipine  (NORVASC ) 10 MG tablet Take 1 tablet (10 mg total) by mouth daily. 05/14/21   Paseda, Folashade R, FNP  benazepril  (LOTENSIN ) 40 MG tablet Take 1 tablet (40 mg total) by mouth daily. 05/14/21   Paseda, Folashade R, FNP  hydrochlorothiazide  (HYDRODIURIL ) 25 MG tablet Take 1 tablet (25 mg total) by mouth daily. 05/14/21   Paseda, Folashade R, FNP  Multiple Vitamin (MULTIVITAMIN WITH MINERALS) TABS tablet Take 1 tablet by mouth daily. 07/25/22   Raguel Con RAMAN, PA-C  oxyCODONE  (OXY IR/ROXICODONE ) 5 MG immediate release tablet Take 1 tablet (5 mg total) by mouth every 6 (six) hours as needed for severe pain. 07/25/22   Raguel Con RAMAN, PA-C  pantoprazole  (PROTONIX ) 40 MG tablet Take 1 tablet  (40 mg total) by mouth daily. 07/26/22   Raguel Con RAMAN, PA-C  potassium chloride  SA (KLOR-CON  M) 20 MEQ tablet Take 1 tablet (20 mEq total) by mouth daily. 07/25/22   Raguel Con RAMAN, PA-C  thiamine  (VITAMIN B-1) 100 MG tablet Take 1 tablet (100 mg total) by mouth daily. 07/25/22   Raguel Con RAMAN, PA-C    Allergies: Tramadol     Review of Systems  Musculoskeletal:  Positive for back pain.  All other systems reviewed and are negative.   Updated Vital Signs BP 136/79   Pulse 60   Temp 98 F (36.7 C) (Oral)   Resp 18   Ht 6' (1.829 m)   Wt 95.3 kg   SpO2 98%   BMI 28.48 kg/m   Physical Exam Vitals and nursing note reviewed.  Constitutional:      Appearance: Normal appearance.  HENT:     Head: Normocephalic and atraumatic.  Eyes:     General:        Right eye: No discharge.        Left eye: No discharge.     Conjunctiva/sclera: Conjunctivae normal.  Pulmonary:     Effort: Pulmonary effort is normal.  Musculoskeletal:     Comments: There is point tenderness to the right para lumbar musculature that does extend up into the right thoracic area just inferior to  the rib cage.  Positive straight leg raise on the right.  5/5 strength to lower extremities.  Normal sensation to lower extremities.  There is no midline tenderness.  Skin:    General: Skin is warm and dry.     Findings: No rash.  Neurological:     General: No focal deficit present.     Mental Status: He is alert.     Comments: Gait is normal.  Psychiatric:        Mood and Affect: Mood normal.        Behavior: Behavior normal.     (all labs ordered are listed, but only abnormal results are displayed) Labs Reviewed - No data to display  EKG: None  Radiology: No results found.   Procedures   Medications Ordered in the ED  lidocaine  (LIDODERM ) 5 % 1 patch (1 patch Transdermal Patch Applied 04/24/24 0949)  ketorolac  (TORADOL ) 15 MG/ML injection 15 mg (15 mg Intramuscular Given 04/24/24 0949)  predniSONE   (DELTASONE ) tablet 20 mg (20 mg Oral Given 04/24/24 0947)    Clinical Course as of 04/24/24 1056  Wed Apr 24, 2024  1051 On repeat evaluation, patient is feeling much better.  Patient walking a little more freely and able to get up from a seated position without pain.  Will prescribe him some lidocaine  patches and steroid burst to go home with.  Patient agreeable with plan. [CF]    Clinical Course User Index [CF] Theotis Cameron HERO, PA-C    Medical Decision Making Corey Thomas is a 59 y.o. male patient who presents to the emergency department today for further evaluation of right lower back pain.  Have a low suspicion at this time for cauda equina syndrome as patient is not having any red flag symptoms.  I also have a low suspicion for epidural abscess.  This seems to be musculoskeletal.  Patient has no midline lumbar tenderness.  This seems to be muscular in nature with some associated sciatica on the right side.  Will plan to give him Toradol  IM, prednisone , and lidocaine  patch and plan to reassess here in 30 minutes.  Do not feel that imaging is warranted at this time.  Patient moving more freely with pain cocktail.  No red flag symptoms.  Strict turn precautions were discussed.  Medication sent to pharmacy.  He is safe for discharge.  Risk Prescription drug management.     Final diagnoses:  Acute right-sided low back pain with right-sided sciatica    ED Discharge Orders          Ordered    predniSONE  (DELTASONE ) 10 MG tablet  Daily        04/24/24 1053    lidocaine  (LIDODERM ) 5 %  Every 24 hours        04/24/24 1053               Theotis Cameron [place], [place] 04/24/24 1056  "

## 2024-04-24 NOTE — ED Triage Notes (Signed)
 Pt c/o lower back pain that radiates to his buttocks and lower legs  Pt states the pain is from shoveling snow on Monday

## 2024-04-24 NOTE — Discharge Instructions (Addendum)
 Please take 600 mg of ibuprofen  every 6 hours as needed for pain.  You can add on Tylenol  the 3 to 4-hour mark.  Continue taking the prednisone  as prescribed.  I would like for you to use lidocaine  patches every 12 hours as needed.  You can follow-up with your primary care doctor.  Please return to the emergency department for any worsening symptoms like trouble walking, weakness, numbness, trouble using the bathroom.
